# Patient Record
Sex: Male | Born: 1975 | Race: White | Hispanic: No | Marital: Single | Smoking: Current every day smoker
Health system: Southern US, Community
[De-identification: ages and names within clinical notes are randomized; demographics above are authoritative.]

## PROBLEM LIST (undated history)

## (undated) DIAGNOSIS — K219 Gastro-esophageal reflux disease without esophagitis: Secondary | ICD-10-CM

## (undated) DIAGNOSIS — Z8489 Family history of other specified conditions: Secondary | ICD-10-CM

## (undated) DIAGNOSIS — F419 Anxiety disorder, unspecified: Secondary | ICD-10-CM

## (undated) DIAGNOSIS — F32A Depression, unspecified: Secondary | ICD-10-CM

## (undated) DIAGNOSIS — F4310 Post-traumatic stress disorder, unspecified: Secondary | ICD-10-CM

## (undated) DIAGNOSIS — F329 Major depressive disorder, single episode, unspecified: Secondary | ICD-10-CM

## (undated) HISTORY — PX: FRACTURE SURGERY: SHX138

## (undated) HISTORY — PX: ELECTROCONVULSIVE THERAPY: SHX1495

---

## 2004-03-08 ENCOUNTER — Emergency Department: Payer: Self-pay | Admitting: Emergency Medicine

## 2012-03-18 ENCOUNTER — Emergency Department: Payer: Self-pay | Admitting: Unknown Physician Specialty

## 2012-03-18 LAB — COMPREHENSIVE METABOLIC PANEL
Anion Gap: 5 — ABNORMAL LOW (ref 7–16)
Bilirubin,Total: 0.4 mg/dL (ref 0.2–1.0)
Calcium, Total: 8.7 mg/dL (ref 8.5–10.1)
Co2: 26 mmol/L (ref 21–32)
EGFR (Non-African Amer.): >60
Osmolality: 278 (ref 275–301)
Potassium: 3.9 mmol/L (ref 3.5–5.1)
SGPT (ALT): 42 U/L (ref 12–78)
Total Protein: 8.2 g/dL (ref 6.4–8.2)

## 2012-03-18 LAB — DIFFERENTIAL
Basophil %: 0.5 %
Eosinophil #: 0.2 10*3/uL (ref 0.0–0.7)
Eosinophil %: 1.5 %
Lymphocyte %: 36 %
Monocyte #: 1.6 x10 3/mm — ABNORMAL HIGH (ref 0.2–1.0)
Neutrophil %: 50.1 %

## 2012-03-18 LAB — DRUG SCREEN, URINE
Barbiturates, Ur Screen: NEGATIVE (ref ?–200)
Cannabinoid 50 Ng, Ur ~~LOC~~: NEGATIVE (ref ?–50)
Cocaine Metabolite,Ur ~~LOC~~: NEGATIVE (ref ?–300)
MDMA (Ecstasy)Ur Screen: NEGATIVE (ref ?–500)
Methadone, Ur Screen: NEGATIVE (ref ?–300)
Phencyclidine (PCP) Ur S: NEGATIVE (ref ?–25)

## 2012-03-18 LAB — CBC
HCT: 46.9 % (ref 40.0–52.0)
HGB: 15.6 g/dL (ref 13.0–18.0)
MCH: 30.1 pg (ref 26.0–34.0)
Platelet: 381 10*3/uL (ref 150–440)
RDW: 12.8 % (ref 11.5–14.5)
WBC: 13 10*3/uL — ABNORMAL HIGH (ref 3.8–10.6)

## 2012-03-18 LAB — URINALYSIS, COMPLETE
Bacteria: NONE SEEN
Bilirubin,UR: NEGATIVE
Blood: NEGATIVE
Nitrite: NEGATIVE
Ph: 8 (ref 4.5–8.0)
Protein: NEGATIVE
RBC,UR: 2 /HPF (ref 0–5)
WBC UR: 2 /HPF (ref 0–5)

## 2012-03-18 LAB — TSH: Thyroid Stimulating Horm: 2.96 u[IU]/mL

## 2012-03-22 ENCOUNTER — Emergency Department: Payer: Self-pay | Admitting: Emergency Medicine

## 2012-03-22 LAB — COMPREHENSIVE METABOLIC PANEL
Albumin: 4.2 g/dL (ref 3.4–5.0)
Alkaline Phosphatase: 120 U/L (ref 50–136)
BUN: 12 mg/dL (ref 7–18)
Bilirubin,Total: 0.4 mg/dL (ref 0.2–1.0)
Chloride: 107 mmol/L (ref 98–107)
Creatinine: 0.75 mg/dL (ref 0.60–1.30)
EGFR (Non-African Amer.): >60
Glucose: 96 mg/dL (ref 65–99)
Potassium: 4 mmol/L (ref 3.5–5.1)
SGPT (ALT): 45 U/L (ref 12–78)
Total Protein: 7.8 g/dL (ref 6.4–8.2)

## 2012-03-22 LAB — URINALYSIS, COMPLETE
Bilirubin,UR: NEGATIVE
Ketone: NEGATIVE
Nitrite: NEGATIVE
Ph: 7 (ref 4.5–8.0)
Protein: NEGATIVE
Specific Gravity: 1.015 (ref 1.003–1.030)
WBC UR: NONE SEEN /HPF (ref 0–5)

## 2012-03-22 LAB — CBC
HGB: 15.6 g/dL (ref 13.0–18.0)
RBC: 5.11 10*6/uL (ref 4.40–5.90)
RDW: 12.6 % (ref 11.5–14.5)
WBC: 12.7 10*3/uL — ABNORMAL HIGH (ref 3.8–10.6)

## 2012-03-22 LAB — TROPONIN I: Troponin-I: <0.02 ng/mL

## 2012-03-31 ENCOUNTER — Ambulatory Visit: Payer: Self-pay | Admitting: Neurology

## 2012-04-08 ENCOUNTER — Ambulatory Visit: Payer: Self-pay | Admitting: Neurology

## 2012-11-27 ENCOUNTER — Ambulatory Visit: Payer: Self-pay | Admitting: Hematology and Oncology

## 2012-12-03 ENCOUNTER — Ambulatory Visit: Payer: Self-pay | Admitting: Family Medicine

## 2012-12-11 ENCOUNTER — Ambulatory Visit: Payer: Self-pay | Admitting: Hematology and Oncology

## 2012-12-11 LAB — CBC CANCER CENTER
Eosinophil #: 0.2 x10 3/mm (ref 0.0–0.7)
Lymphocyte #: 3.2 x10 3/mm (ref 1.0–3.6)
Lymphocyte %: 19.9 %
Monocyte #: 1.7 x10 3/mm — ABNORMAL HIGH (ref 0.2–1.0)
Monocyte %: 10.5 %
Neutrophil #: 10.8 x10 3/mm — ABNORMAL HIGH (ref 1.4–6.5)
Neutrophil %: 67.8 %
Platelet: 299 x10 3/mm (ref 150–440)
WBC: 15.9 x10 3/mm — ABNORMAL HIGH (ref 3.8–10.6)

## 2013-01-05 ENCOUNTER — Ambulatory Visit: Payer: Self-pay | Admitting: Hematology and Oncology

## 2013-01-20 ENCOUNTER — Ambulatory Visit: Payer: Self-pay | Admitting: Psychiatry

## 2013-01-20 ENCOUNTER — Inpatient Hospital Stay: Payer: Self-pay | Admitting: Psychiatry

## 2013-01-20 LAB — URINALYSIS, COMPLETE
Bacteria: NONE SEEN
Bilirubin,UR: NEGATIVE
Nitrite: NEGATIVE
Protein: NEGATIVE
Specific Gravity: 1 (ref 1.003–1.030)
Squamous Epithelial: NONE SEEN

## 2013-01-21 ENCOUNTER — Ambulatory Visit: Payer: Self-pay | Admitting: Psychiatry

## 2013-01-21 LAB — BEHAVIORAL MEDICINE 1 PANEL
Albumin: 3.5 g/dL
Alkaline Phosphatase: 88 U/L
Anion Gap: 5 — ABNORMAL LOW
BUN: 12 mg/dL
Basophil #: 0.1 x10 3/mm 3
Basophil %: 0.6 %
Bilirubin,Total: 0.5 mg/dL
Calcium, Total: 9.1 mg/dL
Chloride: 105 mmol/L
Co2: 28 mmol/L
Creatinine: 1.01 mg/dL
EGFR (African American): >60
EGFR (Non-African Amer.): >60
Eosinophil #: 0.2 x10 3/mm 3
Eosinophil %: 1.6 %
Glucose: 92 mg/dL
HCT: 44.4 %
HGB: 14.9 g/dL
Lymphocyte %: 29.8 %
Lymphs Abs: 4.2 x10 3/mm 3 — ABNORMAL HIGH
MCH: 30 pg
MCHC: 33.6 g/dL
MCV: 89 fL
Monocyte #: 1.3 "x10 3/mm " — ABNORMAL HIGH
Monocyte %: 9.2 %
Neutrophil #: 8.3 x10 3/mm 3 — ABNORMAL HIGH
Neutrophil %: 58.8 %
Osmolality: 275
Platelet: 260 x10 3/mm 3
Potassium: 3.9 mmol/L
RBC: 4.97 x10 6/mm 3
RDW: 13.1 %
SGOT(AST): 14 U/L — ABNORMAL LOW
SGPT (ALT): 14 U/L
Sodium: 138 mmol/L
Thyroid Stimulating Horm: 1.96 u[IU]/mL
Total Protein: 7 g/dL
WBC: 14.1 x10 3/mm 3 — ABNORMAL HIGH

## 2013-02-05 ENCOUNTER — Ambulatory Visit: Payer: Self-pay | Admitting: Psychiatry

## 2013-03-09 ENCOUNTER — Ambulatory Visit: Payer: Self-pay | Admitting: Psychiatry

## 2013-05-26 ENCOUNTER — Ambulatory Visit: Payer: Self-pay | Admitting: Family Medicine

## 2014-05-28 NOTE — H&P (Signed)
PATIENT NAME:  Ronald [name], Ronald [name] MR#:  [id number] DATE OF BIRTH:  February 07, 1975  DATE OF ASSESSMENT:  01/20/2013  IDENTIFYING INFORMATION AND CHIEF COMPLAINT: A 39 year old man admitted voluntarily for electroconvulsive therapy.   CHIEF COMPLAINT: "I'[name] a little nervous."   HISTORY OF PRESENT ILLNESS: Information obtained from the patient and from my previous interactions with him. This is a 39 year old man who reports that he started having severe depression and anxiety around January or February 2014. His symptoms have been getting worse since then despite his efforts to get treatment. He describes symptoms of generalized crippling anxiety and worry, fatigue, lack of ability to focus or concentrate. He gets irritable frequently. He feels down and hopeless and helpless. He has had some thoughts about running his car off the road to kill himself, but has never acted on it. He denies that he has had any hallucinations. The patient does not have an awareness of feeling that his life is extremely stressful, although he does have a lot of responsibilities. He works at a busy job and is involved with a woman who has young children. The patient has been compliant with psychiatric treatment without any response.   PAST PSYCHIATRIC HISTORY: As an adolescent, he had episodes of self-mutilation with some suicidal ideation, but no hospital treatment. He has been seeing a psychiatrist and a psychotherapist for most of 2014. His current medication is Zoloft 200 mg per day and Seroquel 100 mg at night and clonazepam 0.5 mg twice a day. These medications seemed to be slightly helpful when he first started the Zoloft, but have not continued to be helpful for him. He is not complaining of any side effects. He has had no prior hospitalizations, no prior ECT treatment. He goes for psychotherapy regularly.   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 He is involved with a woman who has children, but is not married.   FAMILY HISTORY: No known family history.   SUBSTANCE ABUSE HISTORY: Previous history of moderate drinking, but currently drinks very little to none.   PAST MEDICAL HISTORY: No significant ongoing medical problems.   CURRENT MEDICATIONS: Zoloft 200 mg p.o. daily, Seroquel 100 mg p.o. at bedtime, clonazepam 0.5 mg b.i.d.   ALLERGIES: No known drug allergies.   REVIEW OF SYSTEMS: Depressed mood, anxiety, low energy, poor sleep, inability to focus or concentrate. Feels hopeless and helpless. Denies suicidal intent. Denies hallucinations. No current pain or other physical symptoms.   MENTAL STATUS EXAMINATION: Neatly dressed and groomed young man who looks his stated age or younger. Cooperative with the interview. Good eye contact, normal psychomotor activity. Speech normal rate, tone and volume. Affect anxious and dysphoric. Mood stated as being worried. Thoughts are lucid. No obvious loosening of associations or delusions. Denies auditory or visual hallucinations. Passive suicidal thoughts with no intent or plan to act on it. No homicidal ideation. Reasonably good judgment and insight. Normal intelligence. Alert and oriented x4.   PHYSICAL EXAMINATION:  GENERAL: The patient appears to be in no acute physical distress.  SKIN: No skin lesions identified.  HEENT: Pupils equal and reactive. Face symmetric.  NEUROLOGIC: Strength and reflexes normal and symmetric throughout. Gait within normal limits. Cranial nerves symmetric and normal. MUSCULOSKELETAL: Back and neck nontender.   LUNGS: Clear without wheezes.  HEART: Regular rate and rhythm. ABDOMEN: Soft, nontender, normal bowel sounds.  VITAL SIGNS: Temperature 98.4, pulse 72, respirations  16, blood pressure 140/100.   TOBACCO HISTORY: The patient is a regular smoker of a little less than a pack a day.   LABORATORY DATA: I have ordered a workup set  of labs, nothing is back yet.   ASSESSMENT: A 39 year old man with major depression, severe, unresponsive to medication, with passive suicidal ideation and severe impairment of his ability to work. He has not been able to work for a couple months now because of his illness. He very much wants to receive electroconvulsive therapy. He came to the hospital specifically with the intention of starting electroconvulsive therapy.   TREATMENT PLAN: The patient has been educated about ECT and watched the video. He has been given an opportunity to ask questions. We will continue his current medications for right now and plan to initiate ECT starting tomorrow, right unilateral treatment. Orders completed. Anticipate possible discharge tomorrow afternoon.   DIAGNOSIS, PRINCIPAL AND PRIMARY:  AXIS I: Major depression, severe, recurrent.   SECONDARY DIAGNOSES:  AXIS I: No further.  AXIS II: No diagnosis.  AXIS III: No diagnosis.  AXIS IV: Moderate to severe stress from illness and multiple responsibilities.  AXIS V: Functioning at time of evaluation 37.  ____________________________ Audery Amel, MD jtc:lb D: 01/20/2013 13:28:41 ET T: 01/20/2013 13:37:34 ET JOB#: [id number]  cc: Audery Amel, MD, <Dictator> Audery Amel MD ELECTRONICALLY SIGNED 01/21/2013 12:03

## 2014-05-28 NOTE — Discharge Summary (Signed)
PATIENT NAME:  Ronald [name], [name] MR#:  [id number] DATE OF BIRTH:  1975-02-11  DATE OF ADMISSION:  01/20/2013 DATE OF DISCHARGE:  01/21/2013  HOSPITAL COURSE: See dictated history and physical for details of admission. This is a 39 year old man with a history of major depression and has been unresponsive to medication. He was admitted to begin ECT. The patient was counseled and educated about ECT and saw the film and gave consent. Lab studies were ordered and reviewed. The patient was n.p.o. after midnight on Tuesday and had his first right unilateral ECT treatment on the morning of the 17th. Treatment went well and was tolerated well. He had a transient headache that was treated in the recovery room with Toradol. No other side effects. This afternoon, he reports that he is feeling fine. Denies any suicidal ideation, feels more optimistic, and is looking forward to continuing ECT treatment. The patient was counseled about the overall treatment plan. He is to continue his current medicine, but not take clonazepam the morning of treatment. He will be communicated with tomorrow about treatment time for Friday.   MENTAL STATUS EXAM AT DISCHARGE: Neatly dressed and groomed gentleman who looks his stated age, cooperative with the interview. Good eye contact. Normal psychomotor activity. Speech normal rate, tone and volume. Affect euthymic, reactive, appropriate. Mood stated as good. Thoughts lucid. No loosening of associations or delusions. Denies auditory or visual hallucinations. Denies suicidal or homicidal ideation. Shows good insight and judgment. Normal intelligence. Alert and oriented x 4.   LABORATORY RESULTS: TSH normal at 1.0. Urinalysis completely normal. EKG showed normal sinus rhythm with no significant clear abnormalities. Chest x-ray was showing focal old scarring in the lung bases, no acute disease. The full chemistry panel and hematology panel showed a slight increase in white count at 14.1.  Chemistries were all unremarkable except for a low AST at 14.   DISCHARGE MEDICATIONS: Klonopin 0.5 mg twice a day, Zoloft 100 mg 2 tablets once a day, and Seroquel 100 mg at night.   DISPOSITION: Discharge home. Follow up with ECT Friday.   DIAGNOSIS, PRINCIPAL AND PRIMARY:  AXIS I: Major depression, severe, single.   SECONDARY DIAGNOSES: AXIS I: Generalized anxiety disorder.  AXIS II: No diagnosis.  AXIS III: No diagnosis.  AXIS IV: Moderate to severe from being out of work, multiple stresses in his life.  AXIS V: Functioning at time of discharge 52.   ____________________________ Audery AmelJohn T. Emiliano Welshans, MD jtc:jcm D: 01/21/2013 13:52:00 ET T: 01/21/2013 20:08:39 ET JOB#: [id number]  cc: Audery AmelJohn T. Deneka Greenwalt, MD, <Dictator> Audery AmelJOHN T Zennie Ayars MD ELECTRONICALLY SIGNED 01/22/2013 10:38

## 2014-07-14 ENCOUNTER — Emergency Department
Admission: EM | Admit: 2014-07-14 | Discharge: 2014-07-15 | Disposition: A | Discharge status: Home / Self Care | Payer: Self-pay | Attending: Student | Admitting: Student

## 2014-07-14 ENCOUNTER — Emergency Department: Payer: Self-pay

## 2014-07-14 ENCOUNTER — Encounter: Payer: Self-pay | Admitting: *Deleted

## 2014-07-14 DIAGNOSIS — Y998 Other external cause status: Secondary | ICD-10-CM | POA: Insufficient documentation

## 2014-07-14 DIAGNOSIS — Y9289 Other specified places as the place of occurrence of the external cause: Secondary | ICD-10-CM | POA: Insufficient documentation

## 2014-07-14 DIAGNOSIS — Y9364 Activity, baseball: Secondary | ICD-10-CM | POA: Insufficient documentation

## 2014-07-14 DIAGNOSIS — Z720 Tobacco use: Secondary | ICD-10-CM | POA: Insufficient documentation

## 2014-07-14 DIAGNOSIS — S8012XA Contusion of left lower leg, initial encounter: Secondary | ICD-10-CM | POA: Insufficient documentation

## 2014-07-14 DIAGNOSIS — W2107XA Struck by softball, initial encounter: Secondary | ICD-10-CM | POA: Insufficient documentation

## 2014-07-14 HISTORY — DX: Major depressive disorder, single episode, unspecified: F32.9

## 2014-07-14 HISTORY — DX: Depression, unspecified: F32.A

## 2014-07-14 HISTORY — DX: Anxiety disorder, unspecified: F41.9

## 2014-07-14 MED ORDER — IBUPROFEN 800 MG PO TABS: 800.0000 mg | ORAL_TABLET | Freq: Three times a day (TID) | ORAL | Status: DC | PRN

## 2014-07-14 MED ORDER — TETANUS-DIPHTHERIA TOXOIDS TD 5-2 LFU IM INJ
INJECTION | INTRAMUSCULAR | Status: AC
  Filled 2014-07-14: qty 0.5

## 2014-07-14 MED ORDER — OXYCODONE-ACETAMINOPHEN 5-325 MG PO TABS: 1.0000 | ORAL_TABLET | Freq: Three times a day (TID) | ORAL | Status: DC | PRN

## 2014-07-14 MED ORDER — OXYCODONE-ACETAMINOPHEN 5-325 MG PO TABS
1.0000 | ORAL_TABLET | Freq: Once | ORAL | Status: AC
  Administered 2014-07-14: 1 via ORAL

## 2014-07-14 MED ORDER — OXYCODONE-ACETAMINOPHEN 5-325 MG PO TABS
ORAL_TABLET | ORAL | Status: AC
  Filled 2014-07-14: qty 1

## 2014-07-14 MED ORDER — CEPHALEXIN 500 MG PO CAPS: 500.0000 mg | ORAL_CAPSULE | Freq: Four times a day (QID) | ORAL | Status: AC

## 2014-07-14 MED ORDER — IBUPROFEN 800 MG PO TABS
800.0000 mg | ORAL_TABLET | Freq: Once | ORAL | Status: AC
  Administered 2014-07-14: 800 mg via ORAL

## 2014-07-14 MED ORDER — IBUPROFEN 800 MG PO TABS
ORAL_TABLET | ORAL | Status: AC
  Filled 2014-07-14: qty 1

## 2014-07-14 MED ORDER — TETANUS-DIPHTH-ACELL PERTUSSIS 5-2.5-18.5 LF-MCG/0.5 IM SUSP
0.5000 mL | Freq: Once | INTRAMUSCULAR | Status: AC
  Administered 2014-07-14: 0.5 mL via INTRAMUSCULAR

## 2014-07-14 NOTE — Discharge Instructions (Signed)
Apply ice, elevate. Take medication as prescribed. Use crutches and ace wrap as long as pain continues. Rest.   Follow up with orthopedic next week as needed for continued pain.   Return to ER for new or worsening concerns.   Contusion A contusion is a deep bruise. Contusions are the result of an injury that caused bleeding under the skin. The contusion may turn blue, purple, or yellow. Minor injuries will give you a painless contusion, but more severe contusions may stay painful and swollen for a few weeks.  CAUSES  A contusion is usually caused by a blow, trauma, or direct force to an area of the body. SYMPTOMS   Swelling and redness of the injured area.  Bruising of the injured area.  Tenderness and soreness of the injured area.  Pain. DIAGNOSIS  The diagnosis can be made by taking a history and physical exam. An X-ray, CT scan, or MRI may be needed to determine if there were any associated injuries, such as fractures. TREATMENT  Specific treatment will depend on what area of the body was injured. In general, the best treatment for a contusion is resting, icing, elevating, and applying cold compresses to the injured area. Over-the-counter medicines may also be recommended for pain control. Ask your caregiver what the best treatment is for your contusion. HOME CARE INSTRUCTIONS   Put ice on the injured area.  Put ice in a plastic bag.  Place a towel between your skin and the bag.  Leave the ice on for 15-20 minutes, 3-4 times a day, or as directed by your health care provider.  Only take over-the-counter or prescription medicines for pain, discomfort, or fever as directed by your caregiver. Your caregiver may recommend avoiding anti-inflammatory medicines (aspirin, ibuprofen, and naproxen) for 48 hours because these medicines may increase bruising.  Rest the injured area.  If possible, elevate the injured area to reduce swelling. SEEK IMMEDIATE MEDICAL CARE IF:   You have  increased bruising or swelling.  You have pain that is getting worse.  Your swelling or pain is not relieved with medicines. MAKE SURE YOU:   Understand these instructions.  Will watch your condition.  Will get help right away if you are not doing well or get worse. Document Released: 11/01/2004 Document Revised: 01/27/2013 Document Reviewed: 11/27/2010 [facility] Patient Information 2015 [employer], [place]. This information is not intended to replace advice given to you by your health care provider. Make sure you discuss any questions you have with your health care provider.

## 2014-07-14 NOTE — ED Notes (Signed)
Pt ambulatory to triage with steady gait and reports while playing softball last Wednesday a line drive hit his left lower leg and ricocheted off his right leg. He c/o pain and bruising in left lower leg.

## 2014-07-14 NOTE — ED Provider Notes (Signed)
[facility] Emergency Department Provider Note  ____________________________________________  Time seen: Approximately 9:28 PM  I have reviewed the triage vital signs and the nursing notes.   HISTORY  Chief Complaint Leg Injury   HPI Ronald JollyJames M Brosch Jr. is a 39 y.o. male presents to the ER for complaints of left lower leg pain. Patient states that 5 weeks ago he was hit with a softball directly to his left shin and has had continued pain since. However states that one week ago he was again playing softball and again hit with another "line drive "that caused increased pain. Patient states that he has had continued bruising and swelling to the site since injury. However patient states that he has continued to walk including playing another softball game 2 days ago. Denies other fall or injury. Denies pain radiation.  States current pain to the left lower leg is 7 out of 10 aching and throbbing. States worse with movement and ambulation. Denies numbness or tingling sensations.  Denies chest pain or shortness of breath, abdominal pain, nausea, vomiting, or diarrhea. Denies head injury or LOC.    Past Medical History  Diagnosis Date  . Depression   . Anxiety     There are no active problems to display for this patient.   History reviewed. No pertinent past surgical history.  Current Outpatient Rx  Name  Route  Sig  Dispense  Refill  . NONE per patient          .           Marland Kitchen.             Allergies Review of patient's allergies indicates no known allergies.  History reviewed. No pertinent family history.  Social History History  Substance Use Topics  . Smoking status: Current Every Day Smoker  . Smokeless tobacco: Not on file  . Alcohol Use: Yes    Review of Systems Constitutional: No fever/chills Eyes: No visual changes. ENT: No sore throat. Cardiovascular: Denies chest pain. Respiratory: Denies shortness of breath. Gastrointestinal: No  abdominal pain.  No nausea, no vomiting.  No diarrhea.  No constipation. Genitourinary: Negative for dysuria. Musculoskeletal: Negative for back pain. Positive for left lower leg pain. Skin: Negative for rash. Neurological: Negative for headaches, focal weakness or numbness.  10-point ROS otherwise negative.  ____________________________________________   PHYSICAL EXAM:  VITAL SIGNS: ED Triage Vitals  Enc Vitals Group     BP 07/14/14 2050 156/118 mmHg     Pulse Rate 07/14/14 2050 90     Resp -- 18     Temp 07/14/14 2050 98.6 F (37 C)     Temp Source 07/14/14 2050 Oral     SpO2 07/14/14 2050 98 %     Weight 07/14/14 2050 180 lb (81.647 kg)     Height 07/14/14 2050 5\' 8"  (1.727 m)     Head Cir --      Peak Flow --      Pain Score 07/14/14 2053 8     Pain Loc --      Pain Edu? --      Excl. in GC? --    Today's Vitals   07/14/14 2053 07/14/14 2054 07/14/14 2147 07/14/14 2358  BP:  143/103 150/104 144/99  Pulse:   76 63  Temp:   98.5 F (36.9 C) 98 F (36.7 C)  TempSrc:   Oral   Resp:   18 18  SpO2:   96% 96%  PainSc:  8        Constitutional: Alert and oriented. Well appearing and in no acute distress. Eyes: Conjunctivae are normal. PERRL. EOMI. Head: Atraumatic. Nose: No congestion/rhinnorhea. Mouth/Throat: Mucous membranes are moist.  Oropharynx non-erythematous. Neck: No stridor.  No cervical spine tenderness to palpation. Hematological/Lymphatic/Immunilogical: No cervical lymphadenopathy. Cardiovascular: Normal rate, regular rhythm. Grossly normal heart sounds.  Good peripheral circulation. Respiratory: Normal respiratory effort.  No retractions. Lungs CTAB. Gastrointestinal: Soft and nontender. No distention. No abdominal bruits. No CVA tenderness. Musculoskeletal: No lower extremity tenderness nor edema.  No joint effusions. No cervical, thoracic, lumbar tenderness. Full range of motion. Choose positions quickly without distress or pain. Except: Left lower  leg medial tibial and anterior tibial moderate ecchymosis and mild to moderate swelling. Superficial abrasions present with mild surrounding erythema. No induration or fluctuance.  Distal pedal pulses equal bilaterally and easily found. Full range of motion, however pain with dorsi and plantar flexion. Steady gait in room.NO pain above left knee of below left ankle. NO swelling to left foot. Right leg full ROM and nontender. Full ROM and nontender bilateral upper extremities.  Neurologic:  Normal speech and language. No gross focal neurologic deficits are appreciated. Speech is normal. No gait instability. Skin:  Skin is warm, dry and intact. No rash noted. Psychiatric: Mood and affect are normal. Speech and behavior are normal.   RADIOLOGY  LEFT TIBIA AND FIBULA - 2 VIEW  COMPARISON: None.  FINDINGS: No fracture of the tibia or fibula. Knee joint and ankle joint appear normal on two views.  IMPRESSION: No acute osseous abnormality.   Electronically Signed By: Genevive Bi M.D. On: 07/14/2014 21:51  Left LOWER EXTREMITY VENOUS DOPPLER ULTRASOUND  TECHNIQUE: Gray-scale sonography with graded compression, as well as color Doppler and duplex ultrasound were performed to evaluate the lower extremity deep venous systems from the level of the common femoral vein and including the common femoral, femoral, profunda femoral, popliteal and calf veins including the posterior tibial, peroneal and gastrocnemius veins when visible. The superficial great saphenous vein was also interrogated. Spectral Doppler was utilized to evaluate flow at rest and with distal augmentation maneuvers in the common femoral, femoral and popliteal veins.  COMPARISON: None.  FINDINGS: Contralateral Common Femoral Vein: Respiratory phasicity is normal and symmetric with the symptomatic side. No evidence of thrombus. Normal compressibility.  Common Femoral Vein: No evidence of thrombus.  Normal compressibility, respiratory phasicity and response to augmentation.  Saphenofemoral Junction: No evidence of thrombus. Normal compressibility and flow on color Doppler imaging.  Profunda Femoral Vein: No evidence of thrombus. Normal compressibility and flow on color Doppler imaging.  Femoral Vein: No evidence of thrombus. Normal compressibility, respiratory phasicity and response to augmentation.  Popliteal Vein: No evidence of thrombus. Normal compressibility, respiratory phasicity and response to augmentation.  Calf Veins: No evidence of thrombus. Normal compressibility and flow on color Doppler imaging.  Superficial Great Saphenous Vein: No evidence of thrombus. Normal compressibility and flow on color Doppler imaging.  Venous Reflux: None.  Other Findings: None.  IMPRESSION: No evidence of deep venous thrombosis.   Electronically Signed By: Ellery Plunk M.D. On: 07/14/2014 23:09 ____________________________________________   PROCEDURES  Procedure(s) performed: SPLINT APPLICATION Date/Time: 12:04 AM Authorized by: Renford Dills Consent: Verbal consent obtained. Risks and benefits: risks, benefits and alternatives were discussed Consent given by: patient Splint applied by: ed technician Location details: left lower leg Splint type: ace wrap and crutches Post-procedure: The splinted body part was neurovascularly unchanged following the procedure. Patient tolerance:  Patient tolerated the procedure well with no immediate complications.   ____________________________________________   INITIAL IMPRESSION / ASSESSMENT AND PLAN / ED COURSE  Pertinent labs & imaging results that were available during my care of the patient were reviewed by me and considered in my medical decision making (see chart for details).  Well-appearing patient. No acute distress. Complains of left lower tib-fib pain post softball injury. Initial supple injury 5 weeks  ago and repeat injury 1 week ago. Continues to ambulate. We'll evaluate by ultrasound due to continued pain and swelling, as well as x-ray.  Ultrasound negative for deep venous thrombosis. Left tib-fib x-ray negative for acute osseous abnormality. Will discharge with Ace wrap and crutches. Patient to elevate and apply ice. Discussed rest. When necessary pain medication. As well as due to mild erythema surrounding abrasions will also treat with oral cephalexin. Follow-up with orthopedic next week as needed for continued pain. Patient agreed to plan.Also discussed monitoring blood pressure at home. Patient states he will monitor it and keep a diary and follow up with his primary care physician regarding blood pressure. Denies chest pain, headache, dizziness or other complaints. ____________________________________________   FINAL CLINICAL IMPRESSION(S) / ED DIAGNOSES  Final diagnoses:  Contusion of left leg, initial encounter  Left lower leg pain, initial acute    Renford Dills, NP 07/15/14 0004  Gayla Doss, MD 07/27/14 [phone]

## 2014-08-01 ENCOUNTER — Encounter: Payer: Self-pay | Admitting: Emergency Medicine

## 2014-08-01 ENCOUNTER — Emergency Department
Admission: EM | Admit: 2014-08-01 | Discharge: 2014-08-01 | Disposition: A | Discharge status: Home / Self Care | Payer: Self-pay | Attending: Emergency Medicine | Admitting: Emergency Medicine

## 2014-08-01 ENCOUNTER — Emergency Department: Payer: Self-pay

## 2014-08-01 DIAGNOSIS — S81802A Unspecified open wound, left lower leg, initial encounter: Secondary | ICD-10-CM

## 2014-08-01 DIAGNOSIS — L089 Local infection of the skin and subcutaneous tissue, unspecified: Secondary | ICD-10-CM | POA: Insufficient documentation

## 2014-08-01 DIAGNOSIS — Y9289 Other specified places as the place of occurrence of the external cause: Secondary | ICD-10-CM | POA: Insufficient documentation

## 2014-08-01 DIAGNOSIS — Z720 Tobacco use: Secondary | ICD-10-CM | POA: Insufficient documentation

## 2014-08-01 DIAGNOSIS — Y9364 Activity, baseball: Secondary | ICD-10-CM | POA: Insufficient documentation

## 2014-08-01 DIAGNOSIS — Y998 Other external cause status: Secondary | ICD-10-CM | POA: Insufficient documentation

## 2014-08-01 DIAGNOSIS — W2107XA Struck by softball, initial encounter: Secondary | ICD-10-CM | POA: Insufficient documentation

## 2014-08-01 DIAGNOSIS — T798XXA Other early complications of trauma, initial encounter: Secondary | ICD-10-CM | POA: Insufficient documentation

## 2014-08-01 MED ORDER — SULFAMETHOXAZOLE-TRIMETHOPRIM 800-160 MG PO TABS: 1.0000 | ORAL_TABLET | Freq: Two times a day (BID) | ORAL | Status: DC

## 2014-08-01 MED ORDER — SULFAMETHOXAZOLE-TRIMETHOPRIM 800-160 MG PO TABS
ORAL_TABLET | ORAL | Status: AC
  Administered 2014-08-01: 1 via ORAL
  Filled 2014-08-01: qty 1

## 2014-08-01 MED ORDER — SULFAMETHOXAZOLE-TRIMETHOPRIM 800-160 MG PO TABS
1.0000 | ORAL_TABLET | Freq: Once | ORAL | Status: AC
  Administered 2014-08-01: 1 via ORAL

## 2014-08-01 NOTE — Discharge Instructions (Signed)
Dressing change 2 times per day. Return to the ER if not improving in 2-3 days or sooner if worse. Follow up with [facility] for depression and anxiety medication management.

## 2014-08-01 NOTE — ED Notes (Signed)
Pt was seen here two weeks ago for injury to lower left leg. Pt has hole noted in leg to lower left extremity. Wound is draining and redness noted around site.

## 2014-08-01 NOTE — ED Provider Notes (Signed)
[facility] Emergency Department Provider Note ____________________________________________  Time seen: Approximately 8:51 PM  I have reviewed the triage vital signs and the nursing notes.   HISTORY  Chief Complaint Wound Check   HPI Ronald Amore. is a 39 y.o. male presents to the emergency department for a recheck of a wound to the left lower extremity. He states that he was seen here a couple weeks ago for an injury to his lower leg but did not have any infection at that time. He states that over the past 2 weeks he has had some drainage from the area where a softball struck him in the leg. The area opened up today after he got into the swimming pool with his daughter. He had been prescribed antibiotics at his last visit, but did not fill the prescription due to financial difficulty. He denies an increase in pain over the last couple of days. He states that the redness and swelling that had been there is now resolving. He is not taking anything right now for pain. He is also requesting refills of his depression and anxiety medications. But he states that he has an appointment with [facility] tomorrow.   Past Medical History  Diagnosis Date  . Depression   . Anxiety     There are no active problems to display for this patient.   History reviewed. No pertinent past surgical history.  Current Outpatient Rx  Name  Route  Sig  Dispense  Refill  . ibuprofen (ADVIL,MOTRIN) 800 MG tablet   Oral   Take 1 tablet (800 mg total) by mouth every 8 (eight) hours as needed for mild pain or moderate pain.   15 tablet   0   . oxyCODONE-acetaminophen (ROXICET) 5-325 MG per tablet   Oral   Take 1 tablet by mouth every 8 (eight) hours as needed for moderate pain or severe pain (Do not drive or operate heavy machinery while taking as can cause drowsiness.).   9 tablet   0     Allergies Review of patient's allergies indicates no known allergies.  History reviewed. No  pertinent family history.  Social History History  Substance Use Topics  . Smoking status: Current Every Day Smoker -- 1.00 packs/day    Types: Cigarettes  . Smokeless tobacco: Not on file  . Alcohol Use: Yes     Comment: occasional    Review of Systems   Constitutional: No fever/chills Eyes: No visual changes. ENT: No rhinorrhea, congestion, or sore throat Cardiovascular: Denies chest pain. Respiratory: Denies shortness of breath. Gastrointestinal: No abdominal pain.  No nausea, no vomiting.  No diarrhea.  No constipation. Genitourinary: Negative for dysuria. Musculoskeletal: Negative for back pain. Skin: Open lesion to the left lower extremity. Neurological: Negative for headaches, focal weakness or numbness.  10-point ROS otherwise negative.  ____________________________________________   PHYSICAL EXAM:  VITAL SIGNS: ED Triage Vitals  Enc Vitals Group     BP 08/01/14 2021 135/92 mmHg     Pulse Rate 08/01/14 2021 90     Resp 08/01/14 2021 18     Temp 08/01/14 2021 98.4 F (36.9 C)     Temp Source 08/01/14 2021 Oral     SpO2 08/01/14 2021 96 %     Weight 08/01/14 2021 180 lb (81.647 kg)     Height 08/01/14 2021 5\' 10"  (1.778 m)     Head Cir --      Peak Flow --      Pain  Score 08/01/14 2015 0     Pain Loc --      Pain Edu? --      Excl. in [place]? --     Constitutional: Alert and oriented. Well appearing and in no acute distress. Eyes: Conjunctivae are normal. PERRL. EOMI. Head: Atraumatic. Nose: No congestion/rhinnorhea. Mouth/Throat: Mucous membranes are moist.  Oropharynx non-erythematous. No oral lesions. Neck: No stridor. Cardiovascular: Normal rate, regular rhythm.  Good peripheral circulation. Respiratory: Normal respiratory effort.  No retractions. Lungs CTAB. Gastrointestinal: Soft and nontender. No distention. No abdominal bruits.  Musculoskeletal: No lower extremity tenderness nor edema.  No joint effusions. Neurologic:  Normal speech and  language. No gross focal neurologic deficits are appreciated. Speech is normal. No gait instability. Skin:  Rash no; Erythema yes, localized around ulceration; Negative for petechiae.  Psychiatric: Mood and affect are normal. Speech and behavior are normal.  ____________________________________________   LABS (all labs ordered are listed, but only abnormal results are displayed)  Labs Reviewed - No data to display ____________________________________________  EKG   ____________________________________________  RADIOLOGY  No evidence of osteomyelitis or soft tissue gas. ____________________________________________   PROCEDURES  Procedure(s) performed: Wet-to-dry dressing applied by ER tech. ____________________________________________   INITIAL IMPRESSION / ASSESSMENT AND PLAN / ED COURSE  Pertinent labs & imaging results that were available during my care of the patient were reviewed by me and considered in my medical decision making (see chart for details).  Patient was advised to return to the emergency department in 2- 3 days for symptoms that aren't improving. He was advised to take the antibiotic until finished. He was advised to wet-to-dry dressing changes twice a day. He was advised to follow up with [facility] tomorrow as scheduled for medication management. ____________________________________________   FINAL CLINICAL IMPRESSION(S) / ED DIAGNOSES  Final diagnoses:  Wound of left leg, initial encounter       Ronald Pester, FNP 08/01/14 2159  [name], MD 08/01/14 2259

## 2015-10-22 IMAGING — US ABDOMEN ULTRASOUND LIMITED
1 series · 14 of 19 positions shown · non-contrast
Comparison: none

REASON FOR EXAM: gallbladder liver no appendix  abd tenderness RLQ
COMMENTS:

[Series 1: abdomen ultrasound limited · 0.24mm/px · 14 of 19 slices shown]
[im 1/19]
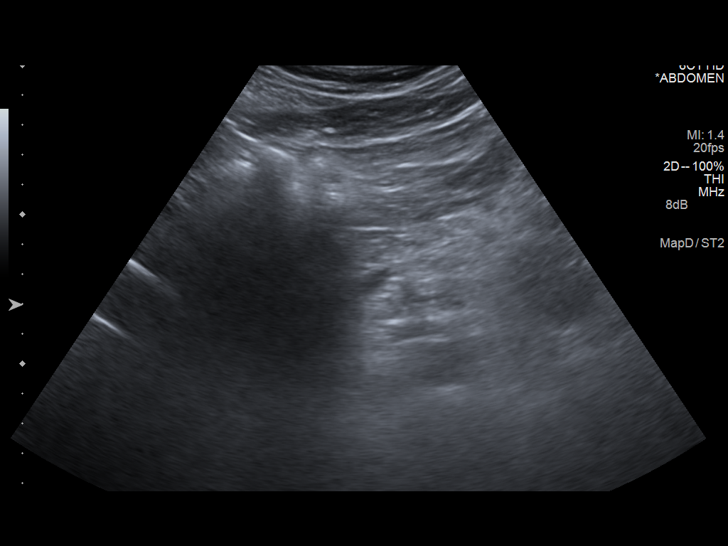
[im 3/19]
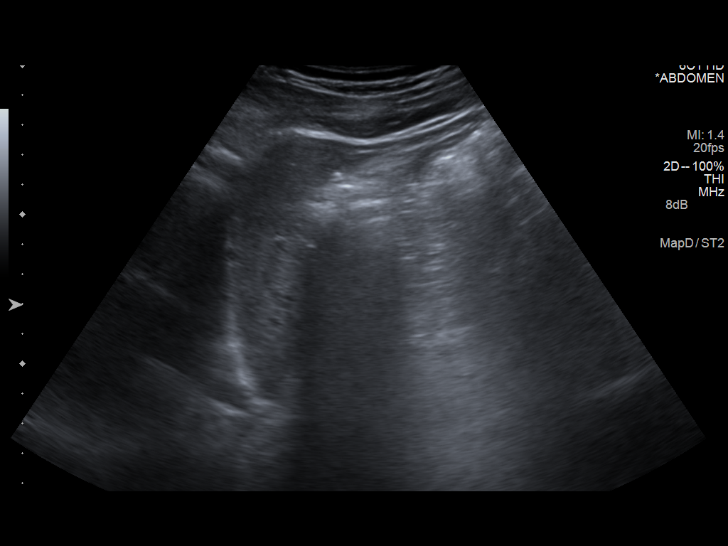
[im 4/19]
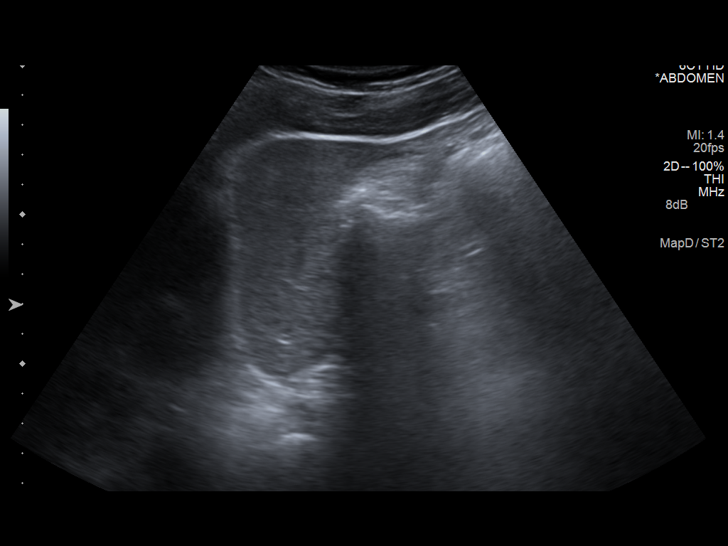
[im 5/19]
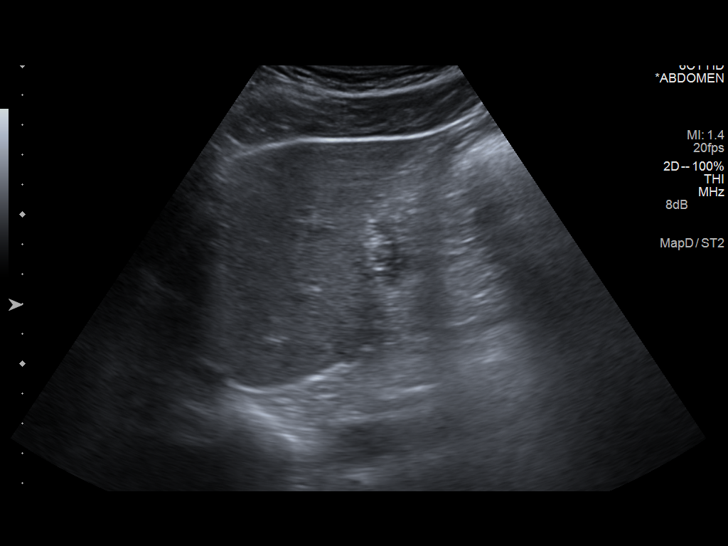
[im 7/19]
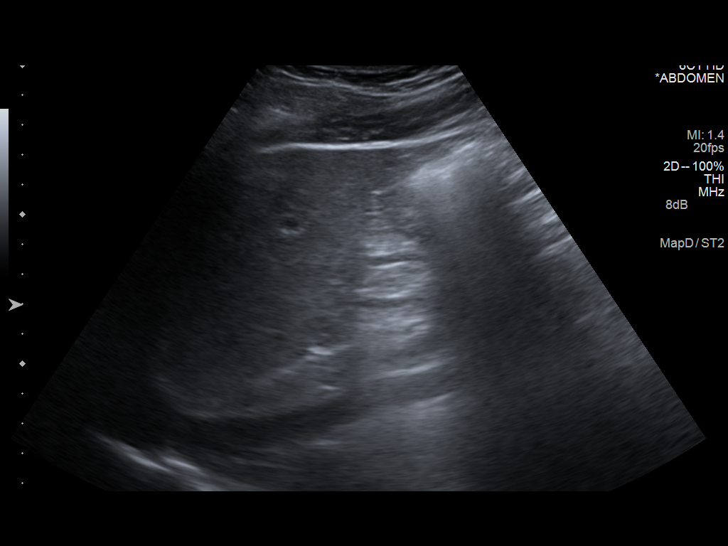
[im 8/19]
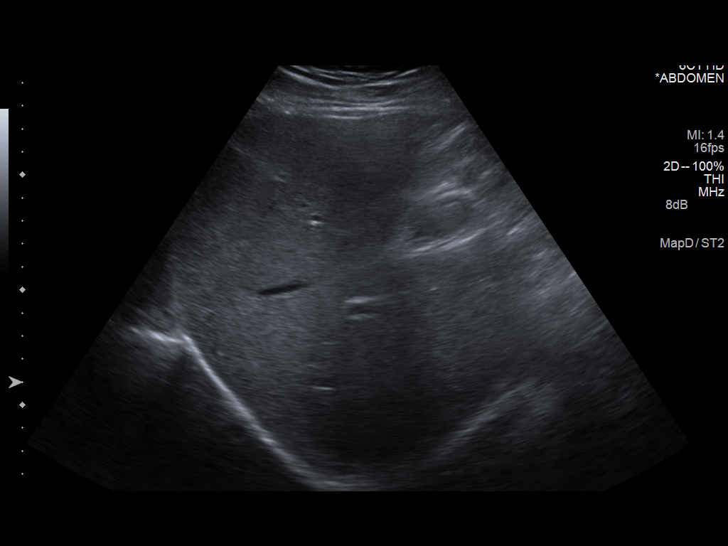
[im 9/19]
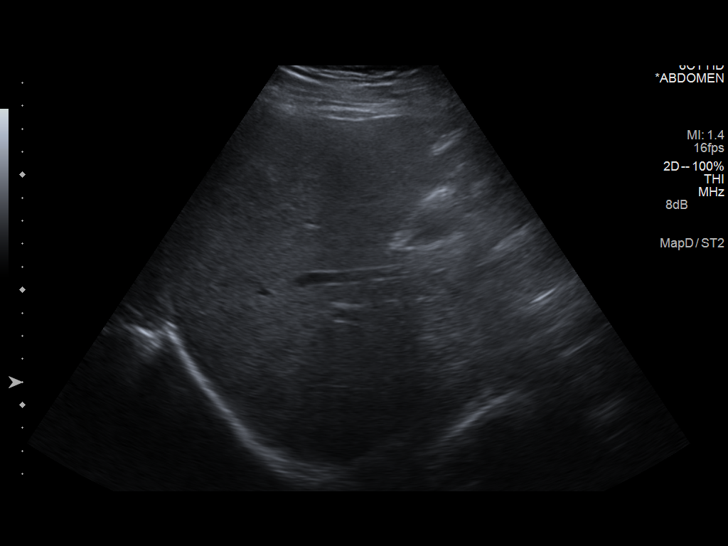
[im 11/19]
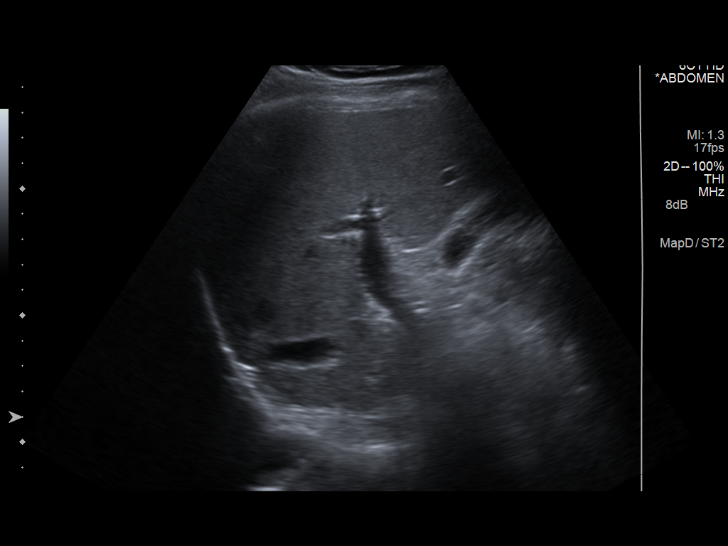
[im 12/19]
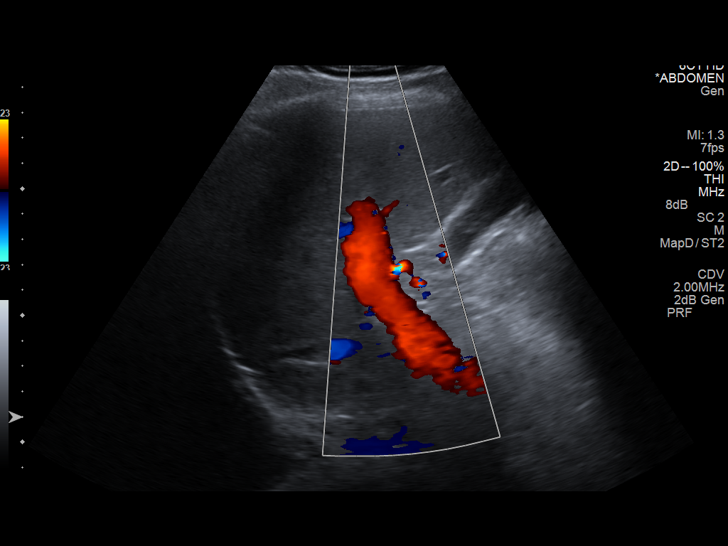
[im 13/19]
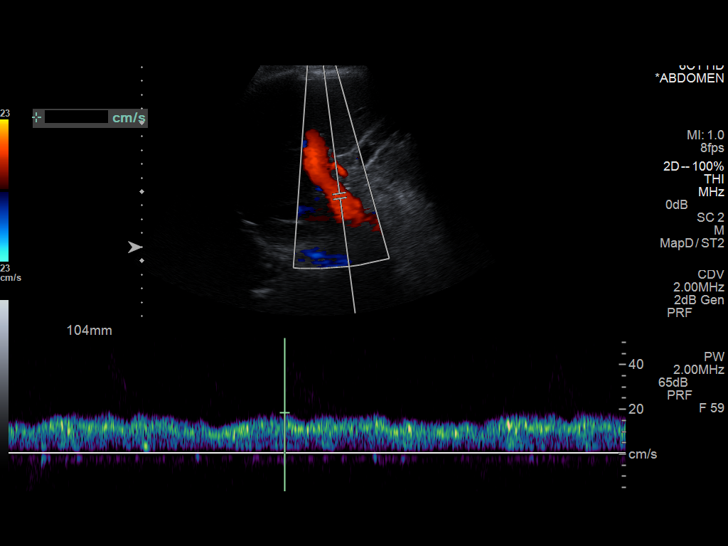
[im 15/19]
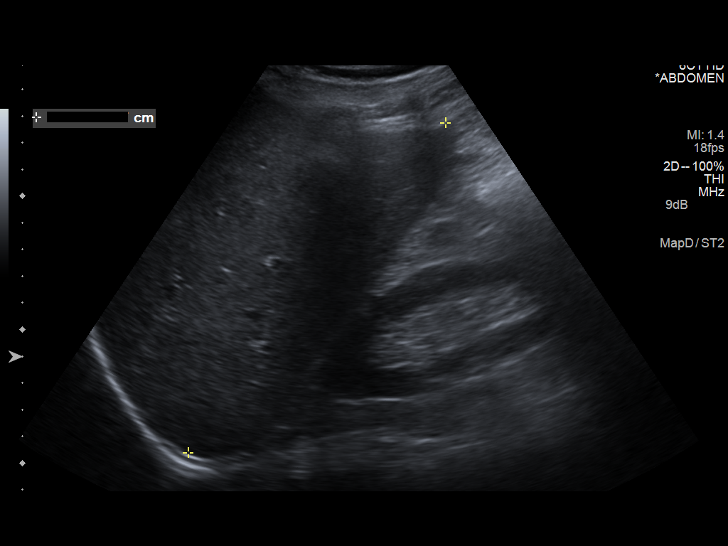
[im 16/19]
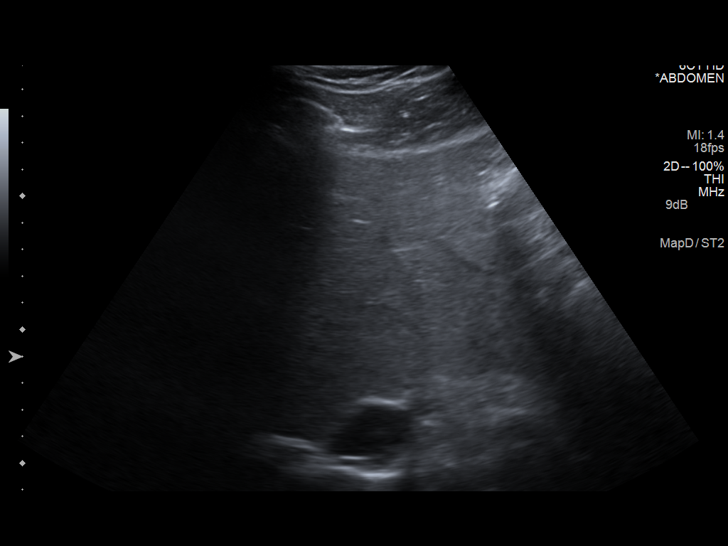
[im 17/19]
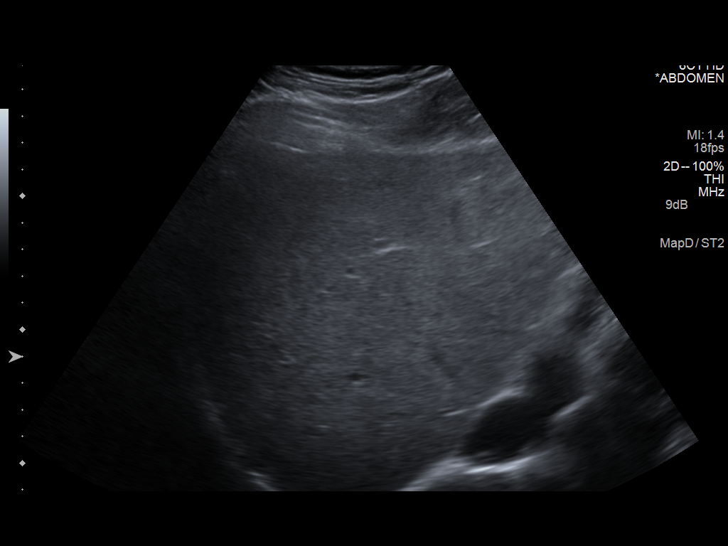
[im 19/19]
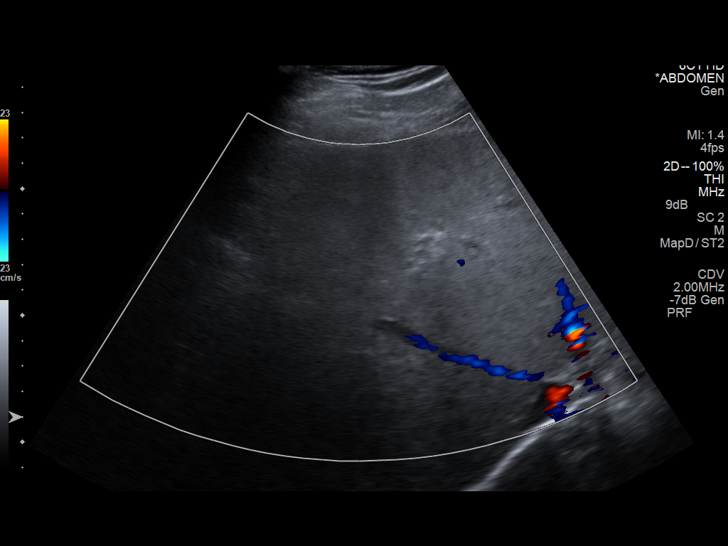

[14 of 19 positions shown; findings below may reference images not displayed]

PROCEDURE:     US  - US ABDOMEN LIMITED SURVEY  - December 03, 2012  [DATE]

RESULT:     Limited right upper quadrant abdominal sonogram is performed.
The pancreas is obscured by overlying bowel gas. There is a negative
sonographic Murphy's sign. No stones are evident. The gallbladder wall
thickness is 2.3 mm. The hepatic echotexture is normal. The liver length is
15.67 cm. There is no intrahepatic or extrahepatic biliary ductal dilation.
Common bile duct diameter is 2.4 mm. The portal venous flow is normal. There
is no ascites.
IMPRESSION: 1. Nonvisualization of the pancreas because of overlying bowel gas.
2. Normal appearing right upper quadrant abdominal sonogram.

[REDACTED]

## 2016-05-14 ENCOUNTER — Encounter: Payer: Self-pay | Admitting: Emergency Medicine

## 2016-05-14 ENCOUNTER — Emergency Department
Admission: EM | Admit: 2016-05-14 | Discharge: 2016-05-14 | Disposition: A | Discharge status: Home / Self Care | Payer: Self-pay | Attending: Emergency Medicine | Admitting: Emergency Medicine

## 2016-05-14 ENCOUNTER — Emergency Department: Payer: Self-pay

## 2016-05-14 DIAGNOSIS — M25572 Pain in left ankle and joints of left foot: Secondary | ICD-10-CM | POA: Insufficient documentation

## 2016-05-14 DIAGNOSIS — Y929 Unspecified place or not applicable: Secondary | ICD-10-CM | POA: Insufficient documentation

## 2016-05-14 DIAGNOSIS — W2107XA Struck by softball, initial encounter: Secondary | ICD-10-CM | POA: Insufficient documentation

## 2016-05-14 DIAGNOSIS — Y9364 Activity, baseball: Secondary | ICD-10-CM | POA: Insufficient documentation

## 2016-05-14 DIAGNOSIS — F17210 Nicotine dependence, cigarettes, uncomplicated: Secondary | ICD-10-CM | POA: Insufficient documentation

## 2016-05-14 DIAGNOSIS — Y990 Civilian activity done for income or pay: Secondary | ICD-10-CM | POA: Insufficient documentation

## 2016-05-14 MED ORDER — OXYCODONE-ACETAMINOPHEN 5-325 MG PO TABS
1.0000 | ORAL_TABLET | Freq: Once | ORAL | Status: AC
  Administered 2016-05-14: 1 via ORAL
  Filled 2016-05-14: qty 1

## 2016-05-14 MED ORDER — IBUPROFEN 100 MG/5ML PO SUSP: 600.0000 mg | Freq: Once | ORAL | Status: DC

## 2016-05-14 MED ORDER — TRAMADOL HCL 50 MG PO TABS: 50.0000 mg | ORAL_TABLET | Freq: Four times a day (QID) | ORAL | 0 refills | Status: DC | PRN

## 2016-05-14 MED ORDER — IBUPROFEN 600 MG PO TABS
600.0000 mg | ORAL_TABLET | Freq: Once | ORAL | Status: AC
  Administered 2016-05-14: 600 mg via ORAL
  Filled 2016-05-14: qty 1

## 2016-05-14 MED ORDER — IBUPROFEN 600 MG PO TABS: 600.0000 mg | ORAL_TABLET | Freq: Three times a day (TID) | ORAL | 0 refills | Status: DC | PRN

## 2016-05-14 NOTE — ED Notes (Signed)
See triage note  States he was playing softball last pm and the ball hit him in the left ankle  Having pain to left lateral ankle area min swelling  No deformity noted

## 2016-05-14 NOTE — ED Triage Notes (Signed)
Pt c/o left ankle pain from injury incurred playing softball last night; some swelling; no obvious deformity; unable to bear weight; pulses palpable; foot warm to touch

## 2016-05-14 NOTE — ED Provider Notes (Signed)
[facility] Emergency Department Provider Note   ____________________________________________   First MD Initiated Contact with Patient 05/14/16 0710     (approximate)  I have reviewed the triage vital signs and the nursing notes.   HISTORY  Chief Complaint Ankle Pain    HPI Ronald [name]. is a 41 y.o. male patient complaining of left ankle pain secondary to being hit by a softball last night.Patient states pain and swelling to the lateral aspect of the ankle and does acute foot. Patient stated pain increases with ambulation. Patient rates his pain as a 10 over 10. Patient described a pain as "throbbing". No palliative measures prior to arrival.   Past Medical History:  Diagnosis Date  . Anxiety   . Depression     There are no active problems to display for this patient.   History reviewed. No pertinent surgical history.  Prior to Admission medications   Medication Sig Start Date End Date Taking? Authorizing Provider  clonazePAM (KLONOPIN) 0.5 MG tablet Take 0.5 mg by mouth 2 (two) times daily.   Yes Historical Provider, MD  ibuprofen (ADVIL,MOTRIN) 600 MG tablet Take 1 tablet (600 mg total) by mouth every 8 (eight) hours as needed. 05/14/16   Joni Reining, PA-C  traMADol (ULTRAM) 50 MG tablet Take 1 tablet (50 mg total) by mouth every 6 (six) hours as needed for moderate pain. 05/14/16   Joni Reining, PA-C    Allergies Patient has no known allergies.  History reviewed. No pertinent family history.  Social History Social History  Substance Use Topics  . Smoking status: Current Every Day Smoker    Packs/day: 1.00    Types: Cigarettes  . Smokeless tobacco: Never Used  . Alcohol use Yes     Comment: occasional    Review of Systems Constitutional: No fever/chills Eyes: No visual changes. ENT: No sore throat. Cardiovascular: Denies chest pain. Respiratory: Denies shortness of breath. Gastrointestinal: No abdominal pain.  No  nausea, no vomiting.  No diarrhea.  No constipation. Genitourinary: Negative for dysuria. Musculoskeletal: Left ankle foot pain  Skin: Negative for rash. Neurological: Negative for headaches, focal weakness or numbness. Psychiatric:Anxiety and depression   ____________________________________________   PHYSICAL EXAM:  VITAL SIGNS: ED Triage Vitals  Enc Vitals Group     BP 05/14/16 0522 (!) 145/89     Pulse Rate 05/14/16 0522 89     Resp 05/14/16 0522 18     Temp 05/14/16 0522 98 F (36.7 C)     Temp Source 05/14/16 0522 Oral     SpO2 05/14/16 0522 98 %     Weight 05/14/16 0516 185 lb (83.9 kg)     Height 05/14/16 0516  (1.727 m)     Head Circumference --      Peak Flow --      Pain Score 05/14/16 0516 10     Pain Loc --      Pain Edu? --      Excl. in GC? --     Constitutional: Alert and oriented. Well appearing and in no acute distress. Eyes: Conjunctivae are normal. PERRL. EOMI. Head: Atraumatic. Nose: No congestion/rhinnorhea. Mouth/Throat: Mucous membranes are moist.  Oropharynx non-erythematous. Neck: No stridor.  No cervical spine tenderness to palpation. Hematological/Lymphatic/Immunilogical: No cervical lymphadenopathy. Cardiovascular: Normal rate, regular rhythm. Grossly normal heart sounds.  Good peripheral circulation. Respiratory: Normal respiratory effort.  No retractions. Lungs CTAB. Gastrointestinal: Soft and nontender. No distention. No abdominal bruits. No CVA tenderness.  Musculoskeletal:No obvious deformity to the left ankle. Patient tender palpation lateral malleolus dose aspect the left foot.  Neurologic:  Normal speech and language. No gross focal neurologic deficits are appreciated. No gait instability. Skin:  Skin is warm, dry and intact. No rash noted. Psychiatric: Mood and affect are normal. Speech and behavior are normal.  ____________________________________________   LABS (all labs ordered are listed, but only abnormal results are  displayed)  Labs Reviewed - No data to display ____________________________________________  EKG   ____________________________________________  RADIOLOGY  No acute findings x-ray of the left ankle. ____________________________________________   PROCEDURES  Procedure(s) performed: None  Procedures  Critical Care performed: No  ____________________________________________   INITIAL IMPRESSION / ASSESSMENT AND PLAN / ED COURSE  Pertinent labs & imaging results that were available during my care of the patient were reviewed by me and considered in my medical decision making (see chart for details).  Left ankle pain secondary to contusion.      ____________________________________________   FINAL CLINICAL IMPRESSION(S) / ED DIAGNOSES  Final diagnoses:  Acute left ankle pain  Discussed negative x-ray finding with patient. Patient given discharge care instructions. Patient given a work note. Ace wrap was applied to ankle. Patient given crutches to assist with ambulation. Patient given a work note for 2 days. Patient advised follow-up family doctor condition persists.  NEW MEDICATIONS STARTED DURING THIS VISIT:  New Prescriptions   IBUPROFEN (ADVIL,MOTRIN) 600 MG TABLET    Take 1 tablet (600 mg total) by mouth every 8 (eight) hours as needed.   TRAMADOL (ULTRAM) 50 MG TABLET    Take 1 tablet (50 mg total) by mouth every 6 (six) hours as needed for moderate pain.     Note:  This document was prepared using Dragon voice recognition software and may include unintentional dictation errors.    Joni Reining, PA-C 05/14/16 1610    Jene Every, MD 05/15/16 [phone]

## 2016-05-14 NOTE — ED Notes (Signed)
Pt waiting patiently for treatment room; understands flex area opens at 7 and he will be seen in that treatment area;

## 2018-09-08 ENCOUNTER — Emergency Department
Admission: EM | Admit: 2018-09-08 | Discharge: 2018-09-08 | Disposition: A | Discharge status: Other Acute Inpatient Hospital | Payer: Self-pay | Attending: Student | Admitting: Student

## 2018-09-08 ENCOUNTER — Encounter: Payer: Self-pay | Admitting: Emergency Medicine

## 2018-09-08 ENCOUNTER — Other Ambulatory Visit: Payer: Self-pay

## 2018-09-08 DIAGNOSIS — F17210 Nicotine dependence, cigarettes, uncomplicated: Secondary | ICD-10-CM | POA: Insufficient documentation

## 2018-09-08 DIAGNOSIS — R45851 Suicidal ideations: Secondary | ICD-10-CM | POA: Insufficient documentation

## 2018-09-08 DIAGNOSIS — Z79899 Other long term (current) drug therapy: Secondary | ICD-10-CM | POA: Insufficient documentation

## 2018-09-08 DIAGNOSIS — F332 Major depressive disorder, recurrent severe without psychotic features: Secondary | ICD-10-CM

## 2018-09-08 DIAGNOSIS — F1010 Alcohol abuse, uncomplicated: Secondary | ICD-10-CM | POA: Insufficient documentation

## 2018-09-08 DIAGNOSIS — F322 Major depressive disorder, single episode, severe without psychotic features: Secondary | ICD-10-CM | POA: Insufficient documentation

## 2018-09-08 DIAGNOSIS — Z20828 Contact with and (suspected) exposure to other viral communicable diseases: Secondary | ICD-10-CM | POA: Insufficient documentation

## 2018-09-08 HISTORY — DX: Post-traumatic stress disorder, unspecified: F43.10

## 2018-09-08 LAB — COMPREHENSIVE METABOLIC PANEL
ALT: 29 U/L (ref 0–44)
AST: 22 U/L (ref 15–41)
Albumin: 4.6 g/dL (ref 3.5–5.0)
Alkaline Phosphatase: 103 U/L (ref 38–126)
Anion gap: 11 (ref 5–15)
BUN: 9 mg/dL (ref 6–20)
CO2: 21 mmol/L — ABNORMAL LOW (ref 22–32)
Calcium: 8.6 mg/dL — ABNORMAL LOW (ref 8.9–10.3)
Chloride: 107 mmol/L (ref 98–111)
Creatinine, Ser: 0.88 mg/dL (ref 0.61–1.24)
GFR calc Af Amer: >60 mL/min (ref >60)
GFR calc non Af Amer: >60 mL/min (ref >60)
Glucose, Bld: 125 mg/dL — ABNORMAL HIGH (ref 70–99)
Potassium: 3.7 mmol/L (ref 3.5–5.1)
Sodium: 139 mmol/L (ref 135–145)
Total Bilirubin: 0.3 mg/dL (ref 0.3–1.2)
Total Protein: 7.9 g/dL (ref 6.5–8.1)

## 2018-09-08 LAB — CBC
HCT: 50 % (ref 39.0–52.0)
Hemoglobin: 17 g/dL (ref 13.0–17.0)
MCH: 30.9 pg (ref 26.0–34.0)
MCHC: 34 g/dL (ref 30.0–36.0)
MCV: 90.7 fL (ref 80.0–100.0)
Platelets: 381 10*3/uL (ref 150–400)
RBC: 5.51 MIL/uL (ref 4.22–5.81)
RDW: 12.6 % (ref 11.5–15.5)
WBC: 18.5 10*3/uL — ABNORMAL HIGH (ref 4.0–10.5)
nRBC: 0 % (ref 0.0–0.2)

## 2018-09-08 LAB — URINE DRUG SCREEN, QUALITATIVE (ARMC ONLY)
Amphetamines, Ur Screen: NOT DETECTED
Barbiturates, Ur Screen: NOT DETECTED
Benzodiazepine, Ur Scrn: NOT DETECTED
Cannabinoid 50 Ng, Ur ~~LOC~~: NOT DETECTED
Cocaine Metabolite,Ur ~~LOC~~: NOT DETECTED
MDMA (Ecstasy)Ur Screen: NOT DETECTED
Methadone Scn, Ur: NOT DETECTED
Opiate, Ur Screen: NOT DETECTED
Phencyclidine (PCP) Ur S: NOT DETECTED
Tricyclic, Ur Screen: NOT DETECTED

## 2018-09-08 LAB — ACETAMINOPHEN LEVEL: Acetaminophen (Tylenol), Serum: <10 ug/mL — ABNORMAL LOW (ref 10–30)

## 2018-09-08 LAB — ETHANOL: Alcohol, Ethyl (B): 196 mg/dL — ABNORMAL HIGH (ref <10)

## 2018-09-08 LAB — SARS CORONAVIRUS 2 (TAT 6-24 HRS): SARS Coronavirus 2: NEGATIVE

## 2018-09-08 LAB — SALICYLATE LEVEL: Salicylate Lvl: <7 mg/dL (ref 2.8–30.0)

## 2018-09-08 LAB — SARS CORONAVIRUS 2 BY RT PCR (HOSPITAL ORDER, PERFORMED IN ~~LOC~~ HOSPITAL LAB): SARS Coronavirus 2: NEGATIVE

## 2018-09-08 NOTE — ED Provider Notes (Signed)
[facility] Emergency Department Provider Note  ____________________________________________   First MD Initiated Contact with Patient 09/08/18 0148     (approximate)  I have reviewed the triage vital signs and the nursing notes.   HISTORY  Chief Complaint Psychiatric Evaluation  Level 5 caveat:  history/ROS may limited by acute intoxication  HPI Ronald JollyJames M Capano Jr. is a 43 y.o. male with medical history as listed below which notably includes major depressive disorder with prior suicide attempt by cutting his wrists and prior ECT treatments.  He reports that his family and social situation has been getting worse and worse and his depression has been worsening to the point that he is again having thoughts of suicide.  He is concerned he may be losing his daughter and he feels hopeless and helpless.  He is out of a job and has no insurance  and cannot afford any medication or to go to a psychiatrist.  His symptoms are severe, gradually worsening over time, nothing of the [place]ona Rid[place]aylor makes it better or worse.  He admits to drinking a large amount of alcohol tonight but says this is not a regular thing for him.  He called a friend tonight and expresses suicidal thoughts and the friend called 911.  The patient denies any medical issues.  He denies headache, sore throat, chest pain, shortness of breath, cough, nausea, vomiting, and abdominal pain.  He has not had contact with any patients known to have COVID-19.        Past Medical History:  Diagnosis Date  . Anxiety   . Depression   . PTSD (post-traumatic stress disorder)     There are no active problems to display for this patient.   History reviewed. No pertinent surgical history.  Prior to Admission medications   Medication Sig Start Date End Date Taking? Authorizing Provider  clonazePAM (KLONOPIN) 0.5 MG tablet Take 0.5 mg by mouth 2 (two) times daily.    [provider]  ibuprofen (ADVIL,MOTRIN)  600 MG tablet Take 1 tablet (600 mg total) by mouth every 8 (eight) hours as needed. 05/14/16   Joni ReiningSmith, Ronald K, PA-C  traMADol (ULTRAM) 50 MG tablet Take 1 tablet (50 mg total) by mouth every 6 (six) hours as needed for moderate pain. 05/14/16   Joni ReiningSmith, Ronald K, PA-C    Allergies Patient has no known allergies.  No family history on file.  Social History Social History   Tobacco Use  . Smoking status: Current Every Day Smoker    Packs/day: 1.00    Types: Cigarettes  . Smokeless tobacco: Never Used  Substance Use Topics  . Alcohol use: Yes    Comment: occasional  . Drug use: No    Review of Systems Level 5 caveat:  history/ROS may limited by acute intoxication  Constitutional: No fever/chills Eyes: No visual changes. ENT: No sore throat. Cardiovascular: Denies chest pain. Respiratory: Denies shortness of breath. Gastrointestinal: No abdominal pain.  No nausea, no vomiting.  No diarrhea.  No constipation. Genitourinary: Negative for dysuria. Musculoskeletal: Negative for neck pain.  Negative for back pain. Integumentary: Negative for rash. Neurological: Negative for headaches, focal weakness or numbness. Psychiatric:  Depression and suicidal thoughts  ____________________________________________   PHYSICAL EXAM:  VITAL SIGNS: ED Triage Vitals  Enc Vitals Group     BP 09/08/18 0049 121/79     Pulse Rate 09/08/18 0049 (!) 110     Resp 09/08/18 0049 18     Temp 09/08/18 0049 98.6 F (  37 C)     Temp Source 09/08/18 0049 Oral     SpO2 09/08/18 0049 97 %     Weight 09/08/18 0048 93 kg (205 lb)     Height 09/08/18 0048 1.727 m (5\' 8" )     Head Circumference --      Peak Flow --      Pain Score 09/08/18 0047 0     Pain Loc --      Pain Edu? --      Excl. in [place]? --     Constitutional: Alert and oriented. Well appearing and in no acute distress. Eyes: Conjunctivae are normal.  Head: Atraumatic. Nose: No congestion/rhinnorhea. Mouth/Throat: Mucous membranes are  moist. Neck: No stridor.  No meningeal signs.   Cardiovascular: Normal rate, regular rhythm. Good peripheral circulation. Grossly normal heart sounds. Respiratory: Normal respiratory effort.  No retractions. No audible wheezing. Gastrointestinal: Soft and nontender. No distention.  Musculoskeletal: No lower extremity tenderness nor edema. No gross deformities of extremities. Neurologic:  Normal speech and language. No gross focal neurologic deficits are appreciated.  Skin:  Skin is warm, dry and intact. No rash noted. Psychiatric: Mood and affect are normal. Speech and behavior are normal.  Patient reports worsening depression over time and now with suicidal thoughts and prior history of suicide attempts and ECT.  ____________________________________________   LABS (all labs ordered are listed, but only abnormal results are displayed)  Labs Reviewed  COMPREHENSIVE METABOLIC PANEL - Abnormal; Notable for the following components:      Result Value   CO2 21 (*)    Glucose, Bld 125 (*)    Calcium 8.6 (*)    All other components within normal limits  ETHANOL - Abnormal; Notable for the following components:   Alcohol, Ethyl (B) 196 (*)    All other components within normal limits  ACETAMINOPHEN LEVEL - Abnormal; Notable for the following components:   Acetaminophen (Tylenol), Serum <10 (*)    All other components within normal limits  CBC - Abnormal; Notable for the following components:   WBC 18.5 (*)    All other components within normal limits  SARS CORONAVIRUS 2  SALICYLATE LEVEL  URINE DRUG SCREEN, QUALITATIVE ([facility] ONLY)   ____________________________________________  EKG  None - EKG not ordered by ED physician ____________________________________________  RADIOLOGY   ED MD interpretation: Notification for imaging  Official radiology report(s): No results found.  ____________________________________________   PROCEDURES   Procedure(s) performed (including  Critical Care):  Procedures   ____________________________________________   INITIAL IMPRESSION / MDM / [place] / ED COURSE  As part of my medical decision making, I reviewed the following data within the [place] notes reviewed and incorporated, Labs reviewed , A consult was requested and obtained from this/these consultant(s) Psychiatry and Notes from prior ED visits   Differential diagnosis includes, but is not limited to, major depressive disorder with suicidal ideation, adjustment disorder, mood disorder.  Patient has no evidence of any acute medical issues at this time other than alcohol intoxication.  He has an extensive history of depression including prior ECT and prior suicide attempt and he is at high risk of trying to kill himself again based on all of the family and social issues.  I am putting him under involuntary commitment and have ordered a tele-psychiatry consult.  He is medically cleared for behavioral medicine placement.  Ethanol level is 196, urine drug screen negative, leukocytosis of unclear significance of 18.5, unremarkable comprehensive metabolic  panel.  Coronavirus swab has been obtained for placement purposes.          ____________________________________________  FINAL CLINICAL IMPRESSION(S) / ED DIAGNOSES  Final diagnoses:  Severe episode of recurrent major depressive disorder, without psychotic features (HCC)  Suicidal ideation     MEDICATIONS GIVEN DURING THIS VISIT:  Medications - No data to display   ED Discharge Orders    None      *Please note:  Ronald JollyJames M Cornman Jr. was evaluated in Emergency Department on 09/08/2018 for the symptoms described in the history of present illness. He was evaluated in the context of the global COVID-19 pandemic, which necessitated consideration that the patient might be at risk for infection with the SARS-CoV-2 virus that causes COVID-19. Institutional protocols and algorithms  that pertain to the evaluation of patients at risk for COVID-19 are in a state of rapid change based on information released by regulatory bodies including the CDC and federal and state organizations. These policies and algorithms were followed during the patient's care in the ED.  Some ED evaluations and interventions may be delayed as a result of limited staffing during the pandemic.*  Note:  This document was prepared using Dragon voice recognition software and may include unintentional dictation errors.   Loleta RoseForbach, Nadiya Pieratt, MD 09/08/18 [phone]

## 2018-09-08 NOTE — ED Triage Notes (Signed)
Patient states that he was drinking tonight and was on the phone with a friend and told that friend that he was going to hurt himself. Patient states that he is " not really" thinking about hurting himself at this time.

## 2018-09-08 NOTE — BH Assessment (Signed)
Assessment Note  Ronald [name]. is an 43 y.o. male. Who present to the emergency department for psychiatric evaluation. Pt with a history of MH concerns as stated below. He shares that he drinks twice per week and that he began drinking on today prior to speaking on the phone with a friend. Pt states that he recalls stating that is tired of being here. He reports that he fell asleep and the police arrived at his home. He shares a history of one previous suicide attempt at age 40. Pt denied any active suicidal thoughts and states that he has had these vague SI off and for years although he denies any intent to harm him self. He states "I have a 19 year old daughter. "Pt is noncompliant with depression medication, he says that he feels better with out them. He has a history of OPT at [facility]. Pt. denies any suicidal ideation, plan or intent at this time. Pt. denies the presence of any auditory or visual hallucinations at this time. Patient denies any other medical complaints.  Diagnosis: Depression   Past Medical History:  Past Medical History:  Diagnosis Date  . Anxiety   . Depression   . PTSD (post-traumatic stress disorder)     History reviewed. No pertinent surgical history.  Family History: No family history on file.  Social History:  reports that he has been smoking cigarettes. He has been smoking about 1.00 pack per day. He has never used smokeless tobacco. He reports current alcohol use. He reports that he does not use drugs.  Additional Social History:  Alcohol / Drug Use Pain Medications: SEE PTA Prescriptions: SEE PTA Over the Counter: SEE PTA History of alcohol / drug use?: Yes Substance #1 Name of Substance 1: Alcohol 1 - Age of First Use: 17 1 - Amount (size/oz): 8-12 beers 1 - Frequency: 2x week 1 - Last Use / Amount: PTA  CIWA: CIWA-Ar BP: 121/79 Pulse Rate: (!) 110 COWS:    Allergies: No Known Allergies  Home Medications: (Not in a hospital admission)   OB/GYN  Status:  No LMP for male patient.  General Assessment Data TTS Assessment: In system Is this a Tele or Face-to-Face Assessment?: Tele Assessment Is this an Initial Assessment or a Re-assessment for this encounter?: Initial Assessment Patient Accompanied by:: N/A Language Other than English: No Living Arrangements: Other (Comment) What gender do you identify as?: Male Marital status: Single Living Arrangements: Alone Can pt return to current living arrangement?: Yes Admission Status: Involuntary Petitioner: ED Attending Is patient capable of signing voluntary admission?: No Referral Source: Self/Family/Friend Insurance type: none   Medical Screening Exam [facility] Walk-in ONLY) Medical Exam completed: Yes  Crisis Care Plan Living Arrangements: Alone Legal Guardian: Other: Name of Psychiatrist: [place] Name of Therapist: none  Education Status Is patient currently in school?: No Is the patient employed, unemployed or receiving disability?: Employed  Risk to self with the past 6 months Suicidal Ideation: No-Not Currently/Within Last 6 Months Has patient been a risk to self within the past 6 months prior to admission? : No Suicidal Intent: No Has patient had any suicidal intent within the past 6 months prior to admission? : No Is patient at risk for suicide?: No, but patient needs Medical Clearance Suicidal Plan?: No Has patient had any suicidal plan within the past 6 months prior to admission? : No Access to Means: No What has been your use of drugs/alcohol within the last 12 months?: alcohol use  How many  times?: 1 Other Self Harm Risks: none Triggers for Past Attempts: Unknown Intentional Self Injurious Behavior: None Family Suicide History: No Persecutory voices/beliefs?: No Depression: Yes Depression Symptoms: Despondent, Loss of interest in usual pleasures, Feeling worthless/self pity Substance abuse history and/or treatment for substance abuse?: Yes(alcohol ) Suicide  prevention information given to non-admitted patients: Yes  Risk to Others within the past 6 months Homicidal Ideation: No Does patient have any lifetime risk of violence toward others beyond the six months prior to admission? : No Thoughts of Harm to Others: No Current Homicidal Intent: No Current Homicidal Plan: No Access to Homicidal Means: No Identified Victim: none History of harm to others?: No Assessment of Violence: None Noted Violent Behavior Description: none  Does patient have access to weapons?: No Criminal Charges Pending?: No Does patient have a court date: No Is patient on probation?: No  Psychosis Hallucinations: None noted Delusions: None noted  Mental Status Report Appearance/Hygiene: In scrubs Eye Contact: Fair Motor Activity: Freedom of movement Speech: Logical/coherent Level of Consciousness: Alert Mood: Depressed Affect: Depressed Anxiety Level: None Thought Processes: Coherent Judgement: Unimpaired Orientation: Place, Person, Time, Situation Obsessive Compulsive Thoughts/Behaviors: None  Cognitive Functioning Concentration: Normal Memory: Recent Intact, Remote Intact Is patient IDD: No Insight: Fair Impulse Control: Fair Appetite: Fair Have you had any weight changes? : No Change Sleep: Decreased Total Hours of Sleep: 6 Vegetative Symptoms: None  ADLScreening [facility] Assessment Services) Patient's cognitive ability adequate to safely complete daily activities?: Yes Patient able to express need for assistance with ADLs?: Yes Independently performs ADLs?: Yes (appropriate for developmental age)  Prior Inpatient Therapy Prior Inpatient Therapy: No  Prior Outpatient Therapy Prior Outpatient Therapy: Yes Prior Therapy Dates: Several months ago Prior Therapy Facilty/Provider(s): [facility] Reason for Treatment: Depression  Does patient have an ACCT team?: No Does patient have Intensive In-House Services?  : No Does patient have [facility] services? :  No Does patient have P4CC services?: No  ADL Screening (condition at time of admission) Patient's cognitive ability adequate to safely complete daily activities?: Yes Patient able to express need for assistance with ADLs?: Yes Independently performs ADLs?: Yes (appropriate for developmental age)         Values / Beliefs Cultural Requests During Hospitalization: None Spiritual Requests During Hospitalization: None Consults Spiritual Care Consult Needed: No Social Work Consult Needed: No Merchant navy officerAdvance Directives (For Healthcare) Does Patient Have a Medical Advance Directive?: No          Disposition:  Disposition Initial Assessment Completed for this Encounter: Yes Patient referred to: Other (Comment)(SOC Consult)  On Site Evaluation by:   Reviewed with Physician:    Asa SaunasShawanna N Kyleigha Markert 09/08/2018 4:27 AM

## 2018-09-08 NOTE — ED Notes (Signed)
Attempting to call report to [facility], have been on hold for 39min will continue to hold ,pt is leaving with the transport officer at present.

## 2018-09-08 NOTE — BH Assessment (Signed)
Patient has been accepted to [facility].  Patient assigned to [facility]. Accepting physician is Dr. Elaina Hoops.  Call report to [phone].  Representative was [employer].   ER Staff is aware of it:  Lattie Haw, ER Secretary  Dr. Corky Downs, ER MD  Colletta Maryland, Patient's Nurse

## 2018-09-08 NOTE — ED Notes (Signed)
[facility]  DEPT  CALLED  FOR TRANSPORT 

## 2018-09-08 NOTE — ED Notes (Signed)
Gave report to [place] at [facility]

## 2018-09-08 NOTE — ED Notes (Signed)
Referral information for Psychiatric Hospitalization faxed to;   [facility] [phone]),   . [place] [facility] [phone] or [phone])  . [facility] [phone]),    . [facility] [phone] -or- [phone]),   . [facility] [phone] or [phone])

## 2018-09-08 NOTE — ED Notes (Signed)
This nurse was placed on hold for a total of 70minutes while attempting to contact them to give report.

## 2018-09-08 NOTE — ED Notes (Signed)
Pt present and ready for [facility] consult

## 2019-11-25 ENCOUNTER — Other Ambulatory Visit: Payer: Self-pay | Admitting: Orthopaedic Surgery

## 2019-11-25 DIAGNOSIS — S62025A Nondisplaced fracture of middle third of navicular [scaphoid] bone of left wrist, initial encounter for closed fracture: Secondary | ICD-10-CM

## 2019-12-07 ENCOUNTER — Ambulatory Visit: Payer: BC Managed Care – PPO

## 2020-01-03 ENCOUNTER — Other Ambulatory Visit: Payer: Self-pay

## 2020-01-03 ENCOUNTER — Emergency Department
Admission: EM | Admit: 2020-01-03 | Discharge: 2020-01-03 | Disposition: A | Discharge status: Home / Self Care | Payer: BC Managed Care – PPO | Attending: Emergency Medicine | Admitting: Emergency Medicine

## 2020-01-03 ENCOUNTER — Emergency Department: Payer: BC Managed Care – PPO

## 2020-01-03 DIAGNOSIS — K219 Gastro-esophageal reflux disease without esophagitis: Secondary | ICD-10-CM | POA: Diagnosis not present

## 2020-01-03 DIAGNOSIS — F17210 Nicotine dependence, cigarettes, uncomplicated: Secondary | ICD-10-CM | POA: Diagnosis not present

## 2020-01-03 DIAGNOSIS — R079 Chest pain, unspecified: Secondary | ICD-10-CM | POA: Diagnosis present

## 2020-01-03 LAB — BASIC METABOLIC PANEL
Anion gap: 11 (ref 5–15)
BUN: 6 mg/dL (ref 6–20)
CO2: 22 mmol/L (ref 22–32)
Calcium: 8.9 mg/dL (ref 8.9–10.3)
Chloride: 103 mmol/L (ref 98–111)
Creatinine, Ser: 0.8 mg/dL (ref 0.61–1.24)
GFR, Estimated: >60 mL/min (ref >60)
Glucose, Bld: 107 mg/dL — ABNORMAL HIGH (ref 70–99)
Potassium: 3.5 mmol/L (ref 3.5–5.1)
Sodium: 136 mmol/L (ref 135–145)

## 2020-01-03 LAB — CBC
HCT: 45.3 % (ref 39.0–52.0)
Hemoglobin: 15.7 g/dL (ref 13.0–17.0)
MCH: 30.6 pg (ref 26.0–34.0)
MCHC: 34.7 g/dL (ref 30.0–36.0)
MCV: 88.3 fL (ref 80.0–100.0)
Platelets: 414 10*3/uL — ABNORMAL HIGH (ref 150–400)
RBC: 5.13 MIL/uL (ref 4.22–5.81)
RDW: 12.1 % (ref 11.5–15.5)
WBC: 18.8 10*3/uL — ABNORMAL HIGH (ref 4.0–10.5)
nRBC: 0 % (ref 0.0–0.2)

## 2020-01-03 LAB — TROPONIN I (HIGH SENSITIVITY): Troponin I (High Sensitivity): 3 ng/L (ref <18)

## 2020-01-03 MED ORDER — CALCIUM CARBONATE ANTACID 500 MG PO CHEW: 2.0000 | CHEWABLE_TABLET | Freq: Three times a day (TID) | ORAL | 0 refills | Status: AC | PRN

## 2020-01-03 MED ORDER — FAMOTIDINE 20 MG PO TABS: 20.0000 mg | ORAL_TABLET | Freq: Two times a day (BID) | ORAL | 0 refills | Status: DC

## 2020-01-03 NOTE — ED Notes (Signed)
Per Dr. Scotty Court, pt does not need 2nd troponin level drawn.

## 2020-01-03 NOTE — ED Triage Notes (Signed)
Pt c/o of chest pain that started this morning. Pt states he has anxiety that can make his BP elevated. Pt states pain is making everything tight and has the shakes. Pt is unaware of cardiac hx but states it runs in his family. Pt reports that BP PTA was 181/125. Current BP is 174/114.

## 2020-01-03 NOTE — ED Provider Notes (Signed)
[facility] Emergency Department Provider Note  ____________________________________________  Time seen: Approximately 8:58 AM  I have reviewed the triage vital signs and the nursing notes.   HISTORY  Chief Complaint Chest Pain    HPI Ronald [name]. is a 44 y.o. male with a history of anxiety depression and PTSD who comes the ED complaining of lower middle chest pain/epigastric pain that started this morning at about 4:30 AM while lying down on the couch watching TV .  Felt like burning, radiating upward, no shortness of breath diaphoresis or vomiting.  Not exertional, not pleuritic.  No fevers or chills palpitations dizziness or syncope.  No aggravating or alleviating factors.  He notes that he started to feel very anxious after the pain started as well.  Since arrival to the ED, pain has resolved.     Past Medical History:  Diagnosis Date  . Anxiety   . Depression   . PTSD (post-traumatic stress disorder)      There are no problems to display for this patient.    No past surgical history on file.   Prior to Admission medications   Medication Sig Start Date End Date Taking? Authorizing Provider  calcium carbonate (TUMS) 500 MG chewable tablet Chew 2 tablets (400 mg of elemental calcium total) by mouth 3 (three) times daily as needed for indigestion or heartburn. 01/03/20 01/02/21  Sharman Cheek, MD  clonazePAM (KLONOPIN) 0.5 MG tablet Take 0.5 mg by mouth 2 (two) times daily.    [provider]  famotidine (PEPCID) 20 MG tablet Take 1 tablet (20 mg total) by mouth 2 (two) times daily. 01/03/20   Sharman Cheek, MD  ibuprofen (ADVIL,MOTRIN) 600 MG tablet Take 1 tablet (600 mg total) by mouth every 8 (eight) hours as needed. 05/14/16   Joni Reining, PA-C  traMADol (ULTRAM) 50 MG tablet Take 1 tablet (50 mg total) by mouth every 6 (six) hours as needed for moderate pain. 05/14/16   Joni Reining, PA-C     Allergies Patient has  no known allergies.   No family history on file.  Social History Social History   Tobacco Use  . Smoking status: Current Every Day Smoker    Packs/day: 1.00    Types: Cigarettes  . Smokeless tobacco: Never Used  Substance Use Topics  . Alcohol use: Yes    Comment: occasional  . Drug use: No    Review of Systems  Constitutional:   No fever or chills.  ENT:   No sore throat. No rhinorrhea. Cardiovascular: Positive chest pain as above without syncope. Respiratory:   No dyspnea or cough. Gastrointestinal:   Negative for abdominal pain, vomiting and diarrhea.  Musculoskeletal:   Negative for focal pain or swelling All other systems reviewed and are negative except as documented above in ROS and HPI.  ____________________________________________   PHYSICAL EXAM:  VITAL SIGNS: ED Triage Vitals  Enc Vitals Group     BP 01/03/20 0623 (!) 174/114     Pulse Rate 01/03/20 0623 98     Resp 01/03/20 0623 20     Temp 01/03/20 0623 97.8 F (36.6 C)     Temp Source 01/03/20 0623 Oral     SpO2 01/03/20 0623 96 %     Weight 01/03/20 0615 195 lb (88.5 kg)     Height 01/03/20 0615 5\' 7"  (1.702 m)     Head Circumference --      Peak Flow --  Pain Score 01/03/20 0614 5     Pain Loc --      Pain Edu? --      Excl. in GC? --     Vital signs reviewed, nursing assessments reviewed.   Constitutional:   Alert and oriented. Non-toxic appearance. Eyes:   Conjunctivae are normal. EOMI. PERRL. ENT      Head:   Normocephalic and atraumatic.      Nose:   Wearing a mask.      Mouth/Throat:   Wearing a mask.      Neck:   No meningismus. Full ROM. Hematological/Lymphatic/Immunilogical:   No cervical lymphadenopathy. Cardiovascular:   RRR. Symmetric bilateral radial and DP pulses.  No murmurs. Cap refill less than 2 seconds. Respiratory:   Normal respiratory effort without tachypnea/retractions. Breath sounds are clear and equal bilaterally. No wheezes/rales/rhonchi. Gastrointestinal:    Soft with mild epigastric tenderness. Non distended. There is no CVA tenderness.  No rebound, rigidity, or guarding.  Musculoskeletal:   Normal range of motion in all extremities. No joint effusions.  No lower extremity tenderness.  No edema. Neurologic:   Normal speech and language.  Motor grossly intact. No acute focal neurologic deficits are appreciated.  Skin:    Skin is warm, dry and intact. No rash noted.  No petechiae, purpura, or bullae.  ____________________________________________    LABS (pertinent positives/negatives) (all labs ordered are listed, but only abnormal results are displayed) Labs Reviewed  BASIC METABOLIC PANEL - Abnormal; Notable for the following components:      Result Value   Glucose, Bld 107 (*)    All other components within normal limits  CBC - Abnormal; Notable for the following components:   WBC 18.8 (*)    Platelets 414 (*)    All other components within normal limits  TROPONIN I (HIGH SENSITIVITY)  TROPONIN I (HIGH SENSITIVITY)   ____________________________________________   EKG  Interpreted by me Normal sinus rhythm rate of 97, normal axis and intervals.  Poor R wave progression.  Normal ST segments and T waves.  No acute ischemic changes.  ____________________________________________    RADIOLOGY  DG Chest 2 View  Result Date: 01/03/2020 CLINICAL DATA:  Chest pain starting this morning. Elevated blood pressure. Current smoker. EXAM: CHEST - 2 VIEW COMPARISON:  05/26/2013 FINDINGS: Normal heart size and pulmonary vascularity. Linear atelectasis or scarring in the lung bases. No airspace disease or consolidation. No pleural effusions. No pneumothorax. Mediastinal contours appear intact. IMPRESSION: Linear atelectasis or scarring in the lung bases. Electronically Signed   By: Burman Nieves M.D.   On: 01/03/2020 06:55     ____________________________________________   PROCEDURES Procedures  ____________________________________________   CLINICAL IMPRESSION / ASSESSMENT AND PLAN / ED COURSE  Medications ordered in the ED: Medications - No data to display  Pertinent labs & imaging results that were available during my care of the patient were reviewed by me and considered in my medical decision making (see chart for details).  Ronald [name]. was evaluated in Emergency Department on 01/03/2020 for the symptoms described in the history of present illness. He was evaluated in the context of the global COVID-19 pandemic, which necessitated consideration that the patient might be at risk for infection with the SARS-CoV-2 virus that causes COVID-19. Institutional protocols and algorithms that pertain to the evaluation of patients at risk for COVID-19 are in a state of rapid change based on information released by regulatory bodies including the CDC and federal and state organizations. These policies  and algorithms were followed during the patient's care in the ED.   Patient presents with atypical lower chest pain/epigastric pain with some mild epigastric tenderness on exam.  I highly suspect GERD/gastritis. Considering the patient's symptoms, medical history, and physical examination today, I have low suspicion for cholecystitis or biliary pathology, pancreatitis, perforation or bowel obstruction, hernia, intra-abdominal abscess, AAA or dissection, volvulus or intussusception, mesenteric ischemia, or appendicitis.  Doubt ACS PE dissection or carditis.  Vital signs unremarkable.  His initial blood pressure was markedly elevated but this was probably exacerbated by his underlying anxiety.  Patient is calm in the ED, feeling better, and blood pressure recheck is 138/94, would not start antihypertensives at this time.  Other vitals unremarkable, EKG nonischemic.  Chest x-ray image viewed by me which is unremarkable,  no pneumonia or pneumothorax, radiology report agrees.  Recommend trial of famotidine and Tums.  Counseled on smoking cessation.  Recommend making an appointment with primary care for monitoring of symptoms and further health maintenance.      ____________________________________________   FINAL CLINICAL IMPRESSION(S) / ED DIAGNOSES    Final diagnoses:  Gastroesophageal reflux disease without esophagitis     ED Discharge Orders         Ordered    famotidine (PEPCID) 20 MG tablet  2 times daily        01/03/20 0857    calcium carbonate (TUMS) 500 MG chewable tablet  3 times daily PRN        01/03/20 0857          Portions of this note were generated with dragon dictation software. Dictation errors may occur despite best attempts at proofreading.   Sharman Cheek, MD 01/03/20 [phone]

## 2020-01-03 NOTE — ED Notes (Signed)
EKG completed and given to provider for review.

## 2021-08-09 ENCOUNTER — Encounter: Payer: Self-pay | Admitting: Emergency Medicine

## 2021-08-09 ENCOUNTER — Other Ambulatory Visit: Payer: Self-pay

## 2021-08-09 DIAGNOSIS — D72829 Elevated white blood cell count, unspecified: Secondary | ICD-10-CM | POA: Diagnosis not present

## 2021-08-09 DIAGNOSIS — K4020 Bilateral inguinal hernia, without obstruction or gangrene, not specified as recurrent: Secondary | ICD-10-CM | POA: Insufficient documentation

## 2021-08-09 DIAGNOSIS — R1032 Left lower quadrant pain: Secondary | ICD-10-CM | POA: Diagnosis present

## 2021-08-09 LAB — COMPREHENSIVE METABOLIC PANEL
ALT: 23 U/L (ref 0–44)
AST: 24 U/L (ref 15–41)
Albumin: 4.5 g/dL (ref 3.5–5.0)
Alkaline Phosphatase: 90 U/L (ref 38–126)
Anion gap: 7 (ref 5–15)
BUN: 10 mg/dL (ref 6–20)
CO2: 22 mmol/L (ref 22–32)
Calcium: 8.7 mg/dL — ABNORMAL LOW (ref 8.9–10.3)
Chloride: 106 mmol/L (ref 98–111)
Creatinine, Ser: 0.85 mg/dL (ref 0.61–1.24)
GFR, Estimated: >60 mL/min (ref >60)
Glucose, Bld: 110 mg/dL — ABNORMAL HIGH (ref 70–99)
Potassium: 3.7 mmol/L (ref 3.5–5.1)
Sodium: 135 mmol/L (ref 135–145)
Total Bilirubin: 0.6 mg/dL (ref 0.3–1.2)
Total Protein: 7.6 g/dL (ref 6.5–8.1)

## 2021-08-09 LAB — CBC WITH DIFFERENTIAL/PLATELET
Abs Immature Granulocytes: 0.07 10*3/uL (ref 0.00–0.07)
Basophils Absolute: 0.1 10*3/uL (ref 0.0–0.1)
Basophils Relative: 1 %
Eosinophils Absolute: 0.1 10*3/uL (ref 0.0–0.5)
Eosinophils Relative: 1 %
HCT: 48.4 % (ref 39.0–52.0)
Hemoglobin: 16.2 g/dL (ref 13.0–17.0)
Immature Granulocytes: 0 %
Lymphocytes Relative: 19 %
Lymphs Abs: 3.4 10*3/uL (ref 0.7–4.0)
MCH: 30.1 pg (ref 26.0–34.0)
MCHC: 33.5 g/dL (ref 30.0–36.0)
MCV: 89.8 fL (ref 80.0–100.0)
Monocytes Absolute: 1.1 10*3/uL — ABNORMAL HIGH (ref 0.1–1.0)
Monocytes Relative: 7 %
Neutro Abs: 12.6 10*3/uL — ABNORMAL HIGH (ref 1.7–7.7)
Neutrophils Relative %: 72 %
Platelets: 318 10*3/uL (ref 150–400)
RBC: 5.39 MIL/uL (ref 4.22–5.81)
RDW: 12.8 % (ref 11.5–15.5)
WBC: 17.4 10*3/uL — ABNORMAL HIGH (ref 4.0–10.5)
nRBC: 0 % (ref 0.0–0.2)

## 2021-08-09 NOTE — ED Triage Notes (Signed)
Patient ambulatory to triage with steady gait, without difficulty or distress noted; pt reports knot to left side pubic region with pain to left buttock & testicle that began yesterday; denies any swelling. Denies any accomp symptoms; denies any heavy lifting, no hx of same

## 2021-08-09 NOTE — ED Provider Triage Note (Signed)
Emergency Medicine Provider Triage Evaluation Note  Ronald [name]., a 46 y.o. male  was evaluated in triage.  Pt complains of recent change to a right "knot" to his left inguinal region.  Patient reports referred pain to his left buttocks and left testicle that began yesterday.  Denies any associated swelling, dysuria, hematuria, urinary retention, or bowel changes.  Denies any recent strenuous activities.  Review of Systems  Positive: Left inguinal groin and testicle pain Negative: Dysuria, hematuria, urinary retention  Physical Exam  BP (!) 149/100 (BP Location: Left Arm)   Pulse 98   Temp 98 F (36.7 C) (Oral)   Resp 18   Ht 5\' 8"  (1.727 m)   Wt 90.7 kg   SpO2 95%   BMI 30.41 kg/m  Gen:   Awake, no distress NAD Resp:  Normal effort CTA MSK:   Moves extremities without difficulty  ABD:  Soft, nontender  Medical Decision Making  Medically screening exam initiated at 11:41 PM.  Appropriate orders placed.  . was informed that the remainder of the evaluation will be completed by another provider, this initial triage assessment does not replace that evaluation, and the importance of remaining in the ED until their evaluation is complete.  Patient to the ED for evaluation of left testicular and inguinal pain.   [name], PA-C 08/09/21 2346

## 2021-08-10 ENCOUNTER — Emergency Department: Payer: BC Managed Care – PPO

## 2021-08-10 ENCOUNTER — Emergency Department
Admission: EM | Admit: 2021-08-10 | Discharge: 2021-08-10 | Disposition: A | Discharge status: Home / Self Care | Payer: BC Managed Care – PPO | Attending: Emergency Medicine | Admitting: Emergency Medicine

## 2021-08-10 DIAGNOSIS — K4020 Bilateral inguinal hernia, without obstruction or gangrene, not specified as recurrent: Secondary | ICD-10-CM

## 2021-08-10 LAB — URINALYSIS, ROUTINE W REFLEX MICROSCOPIC
Bilirubin Urine: NEGATIVE
Glucose, UA: NEGATIVE mg/dL
Hgb urine dipstick: NEGATIVE
Ketones, ur: NEGATIVE mg/dL
Leukocytes,Ua: NEGATIVE
Nitrite: NEGATIVE
Protein, ur: NEGATIVE mg/dL
Specific Gravity, Urine: 1.008 (ref 1.005–1.030)
pH: 5 (ref 5.0–8.0)

## 2021-08-10 LAB — LACTIC ACID, PLASMA: Lactic Acid, Venous: 1.3 mmol/L (ref 0.5–1.9)

## 2021-08-10 MED ORDER — OXYCODONE HCL 5 MG PO TABS: 5.0000 mg | ORAL_TABLET | Freq: Three times a day (TID) | ORAL | 0 refills | Status: DC | PRN

## 2021-08-10 MED ORDER — IOHEXOL 300 MG/ML  SOLN
100.0000 mL | Freq: Once | INTRAMUSCULAR | Status: AC | PRN
  Administered 2021-08-10: 100 mL via INTRAVENOUS

## 2021-08-10 MED ORDER — ONDANSETRON 4 MG PO TBDP: 4.0000 mg | ORAL_TABLET | Freq: Three times a day (TID) | ORAL | 0 refills | Status: DC | PRN

## 2021-08-10 NOTE — ED Notes (Signed)
EDP, Kadie Balestrieri at bedside; update provided.

## 2021-08-10 NOTE — ED Notes (Signed)
Patient transported to CT 

## 2021-08-10 NOTE — ED Notes (Signed)
Pt back from ct at this time.

## 2021-08-10 NOTE — Discharge Instructions (Addendum)
Please follow-up with the surgeon in the clinic to discuss elective hernia repair.  Return to the ED with any worsening symptoms in the meantime.  Please take Tylenol and ibuprofen/Advil for your pain.  It is safe to take them together, or to alternate them every few hours.  Take up to 1000mg  of Tylenol at a time, up to 4 times per day.  Do not take more than 4000 mg of Tylenol in 24 hours.  For ibuprofen, take 400-600 mg, 3 - 4 times per day.  Use oxycodone as needed for more severe/breakthrough pain.  Do not drive or operate machinery while taking this medication.  Use Zofran as needed for any nausea and vomiting.

## 2021-08-10 NOTE — ED Provider Notes (Signed)
[facility] Provider Note    Event Date/Time   First MD Initiated Contact with Patient 08/10/21 [phone]     (approximate)   History   Groin Pain   HPI  Ronald [name]. is a 46 y.o. male who presents to the ED for evaluation of Groin Pain   I reviewed clinic visit from 4/11.  Seen for URI.  History of anxiety, depression and PTSD.  Patient presents to the ED for the ration of acute on chronic fullness to his left groin.  He reports has been this way for a while but the pain has been increasing just for the past couple days.  No intra-abdominal surgery history.  Reports his brother had an inguinal hernia that required surgical repair.  Last bowel movement was 10 or 11 AM on 7/5 and reportedly "normal.  "Uncertain if he has been passing flatus since then.  No emesis, dysuria, hematuria or difficulty voiding.  Reports the pain is little bit better now than what it was earlier today.  Denies my offer for analgesic medications.   Physical Exam   Triage Vital Signs: ED Triage Vitals  Enc Vitals Group     BP 08/09/21 2328 (!) 149/100     Pulse Rate 08/09/21 2328 98     Resp 08/09/21 2328 18     Temp 08/09/21 2328 98 F (36.7 C)     Temp Source 08/09/21 2328 Oral     SpO2 08/09/21 2328 95 %     Weight 08/09/21 2331 200 lb (90.7 kg)     Height 08/09/21 2331 5\' 8"  (1.727 m)     Head Circumference --      Peak Flow --      Pain Score 08/09/21 2330 7     Pain Loc --      Pain Edu? --      Excl. in [place]? --     Most recent vital signs: Vitals:   08/09/21 2328 08/10/21 0223  BP: (!) 149/100 (!) 156/104  Pulse: 98 74  Resp: 18 16  Temp: 98 F (36.7 C)   SpO2: 95% 95%    General: Awake, no distress.  CV:  Good peripheral perfusion.  Resp:  Normal effort.  Abd:  No distention.  Diffuse lower tenderness without peritoneal features. Normal-appearing external genitalia without penile discharge, lesions, skin changes.  No scrotal skin changes, swelling  or tenderness.  Bilateral distended testicles are smooth, round and without tenderness or nodularity. Left inguinal canal has a firm tubelike structure that I can palpate that is tender to palpation.  No overlying skin changes. MSK:  No deformity noted.  Neuro:  No focal deficits appreciated. Other:     ED Results / Procedures / Treatments   Labs (all labs ordered are listed, but only abnormal results are displayed) Labs Reviewed  CBC WITH DIFFERENTIAL/PLATELET - Abnormal; Notable for the following components:      Result Value   WBC 17.4 (*)    Neutro Abs 12.6 (*)    Monocytes Absolute 1.1 (*)    All other components within normal limits  COMPREHENSIVE METABOLIC PANEL - Abnormal; Notable for the following components:   Glucose, Bld 110 (*)    Calcium 8.7 (*)    All other components within normal limits  URINALYSIS, ROUTINE W REFLEX MICROSCOPIC - Abnormal; Notable for the following components:   Color, Urine STRAW (*)    APPearance CLEAR (*)    All other components within  normal limits  LACTIC ACID, PLASMA  LACTIC ACID, PLASMA    EKG   RADIOLOGY Scrotal ultrasound interpreted by me with left-sided inguinal hernia.  No signs of torsion CT abdomen/pelvis interpreted by me without evidence of SBO  Official radiology report(s): CT ABDOMEN PELVIS W CONTRAST  Result Date: 08/10/2021 CLINICAL DATA:  Knot to the left sided pubic region with pain to the left buttock and testicle. EXAM: CT ABDOMEN AND PELVIS WITH CONTRAST TECHNIQUE: Multidetector CT imaging of the abdomen and pelvis was performed using the standard protocol following bolus administration of intravenous contrast. RADIATION DOSE REDUCTION: This exam was performed according to the departmental dose-optimization program which includes automated exposure control, adjustment of the mA and/or kV according to patient size and/or use of iterative reconstruction technique. CONTRAST:  OMNIPAQUE IOHEXOL 300 MG/ML  SOLN  COMPARISON:  None Available. FINDINGS: Lower chest: No acute abnormality. Hepatobiliary: No focal liver abnormality is seen. No gallstones, gallbladder wall thickening, or biliary dilatation. Pancreas: Unremarkable. No pancreatic ductal dilatation or surrounding inflammatory changes. Spleen: Normal in size without focal abnormality. Adrenals/Urinary Tract: Adrenal glands are unremarkable. Kidneys are normal in size, without renal calculi or hydronephrosis. A 5 mm simple cyst is seen within the anteromedial aspect of the mid to upper left kidney. Bladder is unremarkable. Stomach/Bowel: Stomach is within normal limits. Appendix appears normal. No evidence of bowel wall thickening, distention, or inflammatory changes. Vascular/Lymphatic: No significant vascular findings are present. No enlarged abdominal or pelvic lymph nodes. Reproductive: Prostate is unremarkable. Other: There is a 2.6 cm x 1.8 cm fat containing right inguinal hernia. A 3.7 cm x 2.7 cm fat containing left inguinal hernia is also seen. This extends inferiorly into the superior aspect of the scrotum on the left. No associated inflammatory fat stranding or fluid is identified. A 1.1 cm x 1.0 cm fat containing umbilical hernia is noted. No abdominopelvic ascites. Musculoskeletal: No acute or significant osseous findings. IMPRESSION: 1. 3.7 cm x 2.7 cm fat containing left inguinal hernia which extends inferiorly into the superior aspect of the scrotum on the left. 2. Small fat containing right inguinal hernia. Electronically Signed   By: Aram Candela M.D.   On: 08/10/2021 03:15   US SCROTUM W/DOPPLER  Result Date: 08/10/2021 CLINICAL DATA:  Left testicular pain and swelling x1 day. EXAM: SCROTAL ULTRASOUND DOPPLER ULTRASOUND OF THE TESTICLES TECHNIQUE: Complete ultrasound examination of the testicles, epididymis, and other scrotal structures was performed. Color and spectral Doppler ultrasound were also utilized to evaluate blood flow to the  testicles. COMPARISON:  None Available. FINDINGS: Right testicle Measurements: 4.0 cm x 1.9 cm x 2.9 cm. No mass or microlithiasis visualized. Left testicle Measurements: 3.8 cm x 2.1 cm x 2.2 cm. No mass or microlithiasis visualized. Right epididymis:  Normal in size and appearance. Left epididymis:  Normal in size and appearance. Hydrocele:  Small bilateral hydroceles are noted. Varicocele:  A left-sided varicocele is seen. Pulsed Doppler interrogation of both testes demonstrates normal low resistance arterial and venous waveforms bilaterally. Other: Of incidental note is the presence of a left-sided inguinal hernia. IMPRESSION: 1. Left-sided inguinal hernia. Correlation with physical examination is recommended. 2. Small bilateral hydroceles. 3. Left-sided varicocele. Electronically Signed   By: Aram Candela M.D.   On: 08/10/2021 01:14    PROCEDURES and INTERVENTIONS:  Procedures  Medications  iohexol (OMNIPAQUE) 300 MG/ML solution 100 mL (100 mLs Intravenous Contrast Given 08/10/21 0300)     IMPRESSION / MDM / ASSESSMENT AND PLAN / ED COURSE  I reviewed the triage vital signs and the nursing notes.  Differential diagnosis includes, but is not limited to, incarcerating renal hernia, small bowel obstruction, testicular torsion, cystitis  {Patient presents with symptoms of an acute illness or injury that is potentially life-threatening.  46 year old male without surgical history presents with worsening subacute inguinal swelling and pain, with evidence of inguinal hernias but no evidence of incarceration or emergent surgical pathology, suitable for outpatient surgical follow-up.  Poorly localizing and mild abdominal tenderness.  Inguinal swelling and tenderness is noted, but no penile or scrotal pathology noted.  Blood work with leukocytosis that is nonspecific.  No lactic acidosis or metabolic derangements.  Urine without infectious features.  Started with a scrotal ultrasound without evidence  of significant GU pathology.  Hydrocele and varicoceles noted, but he has no scrotal complaints.  CT abdomen/pelvis obtained and demonstrates fat-containing inguinal hernias without signs of incarceration or SBO.  His symptoms are controlled without medications and he is suitable for outpatient management.  We discussed return precautions and I referred him for surgery.  Clinical Course as of 08/10/21 0438  Thu Aug 10, 2021  0406 Reassessed.  Feeling okay.  We discussed CT results without evidence of incarceration or severe complications.  We discussed surgical referral and following up with them in the clinic.  Discussed return precautions. [DS]    Clinical Course User Index [DS] Delton Prairie, MD     FINAL CLINICAL IMPRESSION(S) / ED DIAGNOSES   Final diagnoses:  Bilateral inguinal hernia without obstruction or gangrene, recurrence not specified     Rx / DC Orders   ED Discharge Orders          Ordered    oxyCODONE (ROXICODONE) 5 MG immediate release tablet  Every 8 hours PRN        08/10/21 0408    ondansetron (ZOFRAN-ODT) 4 MG disintegrating tablet  Every 8 hours PRN        08/10/21 0408             Note:  This document was prepared using Dragon voice recognition software and may include unintentional dictation errors.   Delton Prairie, MD 08/10/21 [phone]

## 2021-08-11 ENCOUNTER — Ambulatory Visit: Payer: Self-pay | Admitting: Surgery

## 2021-08-11 NOTE — H&P (Signed)
Subjective:   CC: Non-recurrent bilateral inguinal hernia without obstruction or gangrene [K40.20], umbilical hernia   HPI:  Ronald Moen. is a 46 y.o. male who was referred by Katrinka Blazing for evaluation of above. Symptoms were first noted 2 years ago. Increasing pain 2 days ago. Pain is sharp and constant, confined to the left groin, without radiation.  Associated with nothing, exacerbated by nothing.  Lump is reducible.    Past Medical History:  has a past medical history of Anxiety, Depression, Insomnia, and PTSD (post-traumatic stress disorder).   Past Surgical History: No past surgical history on file.   Family History: family history includes ADD / ADHD in his half-brother; Alcohol abuse in his maternal grandfather; Anxiety in his father and paternal grandmother; COPD in his maternal grandmother; Depression in his father and paternal grandmother; Dystonia in his father; High blood pressure (Hypertension) in his father; Myocardial Infarction (Heart attack) in his paternal grandfather; No Known Problems in his daughter and mother.   Social History:  reports that he has been smoking cigarettes. He has been smoking an average of 1 pack per day. He has never used smokeless tobacco. He reports current alcohol use. He reports that he does not currently use drugs.   Current Medications: has a current medication list which includes the following prescription(s): clonazepam, benzonatate, hydrocodone-chlorpheniramine, and mirtazapine.   Allergies:  Allergies as of 08/10/2021 - Reviewed 08/10/2021 Allergen Reaction Noted  Buspar [buspirone] Swelling 12/08/2018     ROS:  A 15 point review of systems was performed and pertinent positives and negatives noted in HPI   Objective:   BP (!) 161/133   Pulse 99   Ht 172.7 cm (5\' 8" )   Wt 86.6 kg (191 lb)   BMI 29.04 kg/m    Constitutional :  Alert, cooperative, no distress Lymphatics/Throat:  Supple, no lymphadenopathy Respiratory:  clear to  auscultation bilaterally Cardiovascular:  regular rate and rhythm Gastrointestinal: soft, non-tender; bowel sounds normal; no masses,  no organomegaly. inguinal hernia noted.  small, overlying skin changes, and bilateral and umbilical Musculoskeletal: Steady gait and movement Skin: Cool and moist, no surgical scars Psychiatric: Normal affect, non-agitated, not confused       LABS:  N/a    RADS: N/a Assessment:       Non-recurrent bilateral inguinal hernia without obstruction or gangrene [K40.20]   Umbilical hernia Plan:   1. Non-recurrent bilateral inguinal hernia without obstruction or gangrene [K40.20]   Discussed the risk of surgery including recurrence, which can be up to 50% in the case of incisional or complex hernias, possible use of prosthetic materials (mesh) and the increased risk of mesh infxn if used, bleeding, chronic pain, post-op infxn, post-op SBO or ileus, and possible re-operation to address said risks. The risks of general anesthetic, if used, includes MI, CVA, sudden death or even reaction to anesthetic medications also discussed. Alternatives include continued observation.  Benefits include possible symptom relief, prevention of incarceration, strangulation, enlargement in size over time, and the risk of emergency surgery in the face of strangulation.    Typical post-op recovery time of 3-5 days with 2 weeks of activity restrictions were also discussed.   ED return precautions given for sudden increase in pain, size of hernia with accompanying fever, nausea, and/or vomiting.   The patient verbalized understanding and all questions were answered to the patient's satisfaction.     2. Patient has elected to proceed with surgical treatment. Procedure will be scheduled. bilateral, robotic assisted laparoscopic. Primary umbilical  repair at same time by placing umbo port through it.   labs/images/medications/previous chart entries reviewed personally and relevant  changes/updates noted above.

## 2021-08-11 NOTE — H&P (Signed)
Subjective:  CC: No primary diagnosis found.  HPI:  Ronald [name]. is a 46 y.o. male who was referred by Emergency Room for evaluation of above. Symptoms were first noted 2 years ago. Increasing pain 2 days ago. Pain is sharp and constant, confined to the left groin, without radiation.  Associated with nothing, exacerbated by nothing.  Lump is reducible.    Past Medical History:  has a past medical history of Anxiety, Depression, Insomnia, and PTSD (post-traumatic stress disorder).  Past Surgical History: No past surgical history on file.  Family History: family history includes ADD / ADHD in his half-brother; Alcohol abuse in his maternal grandfather; Anxiety in his father and paternal grandmother; COPD in his maternal grandmother; Depression in his father and paternal grandmother; Dystonia in his father; High blood pressure (Hypertension) in his father; Myocardial Infarction (Heart attack) in his paternal grandfather; No Known Problems in his daughter and mother.  Social History:  reports that he has been smoking cigarettes. He has been smoking an average of 1 pack per day. He has never used smokeless tobacco. He reports current alcohol use. He reports that he does not currently use drugs.  Current Medications: has a current medication list which includes the following prescription(s): clonazepam, benzonatate, hydrocodone-chlorpheniramine, and mirtazapine.  Allergies:  Allergies as of 08/10/2021 - Reviewed 08/10/2021 Allergen Reaction Noted  Buspar [buspirone] Swelling 12/08/2018   ROS:  A 15 point review of systems was performed and pertinent positives and negatives noted in HPI   Objective:    BP (!) 161/133   Pulse 99   Ht 172.7 cm (5\' 8" )   Wt 86.6 kg (191 lb)   BMI 29.04 kg/m   Constitutional :  Alert, cooperative, no distress Lymphatics/Throat:  Supple, no lymphadenopathy Respiratory:  clear to auscultation bilaterally Cardiovascular:  regular rate and  rhythm Gastrointestinal: soft, non-tender; bowel sounds normal; no masses,  no organomegaly. inguinal hernia noted.  small, overlying skin changes, and bilateral and umbilical Musculoskeletal: Steady gait and movement Skin: Cool and moist, no surgical scars Psychiatric: Normal affect, non-agitated, not confused     LABS:  N/a   RADS: N/a Assessment:      No primary diagnosis found.  Plan:    1. No primary diagnosis found.   Discussed the risk of surgery including recurrence, which can be up to 50% in the case of incisional or complex hernias, possible use of prosthetic materials (mesh) and the increased risk of mesh infxn if used, bleeding, chronic pain, post-op infxn, post-op SBO or ileus, and possible re-operation to address said risks. The risks of general anesthetic, if used, includes MI, CVA, sudden death or even reaction to anesthetic medications also discussed. Alternatives include continued observation.  Benefits include possible symptom relief, prevention of incarceration, strangulation, enlargement in size over time, and the risk of emergency surgery in the face of strangulation.   Typical post-op recovery time of 3-5 days with 2 weeks of activity restrictions were also discussed.  ED return precautions given for sudden increase in pain, size of hernia with accompanying fever, nausea, and/or vomiting.  The patient verbalized understanding and all questions were answered to the patient's satisfaction.   2. Patient has elected to proceed with surgical treatment. Procedure will be scheduled. bilateral, robotic assisted laparoscopic. Primary umbilical repair at same time by placing umbo port through it.  labs/images/medications/previous chart entries reviewed personally and relevant changes/updates noted above.

## 2021-08-23 ENCOUNTER — Other Ambulatory Visit: Payer: Self-pay

## 2021-08-23 ENCOUNTER — Encounter
Admission: RE | Admit: 2021-08-23 | Discharge: 2021-08-23 | Disposition: A | Discharge status: Home / Self Care | Payer: BC Managed Care – PPO | Source: Ambulatory Visit | Attending: Surgery | Admitting: Surgery

## 2021-08-23 HISTORY — DX: Gastro-esophageal reflux disease without esophagitis: K21.9

## 2021-08-23 HISTORY — DX: Family history of other specified conditions: Z84.89

## 2021-08-23 NOTE — Patient Instructions (Addendum)
Your procedure is scheduled on: 08/25/21 - Friday Report to the Registration Desk on the 1st floor of the [facility]. To find out your arrival time, please call [phone] between 1PM - 3PM on: 08/24/21 - Thursday If your arrival time is 6:00 am, do not arrive prior to that time as the [facility] entrance doors do not open until 6:00 am.  REMEMBER: Instructions that are not followed completely may result in serious medical risk, up to and including death; or upon the discretion of your surgeon and anesthesiologist your surgery may need to be rescheduled.  Do not eat food after midnight the night before surgery.  No gum chewing, lozengers or hard candies.  You may however, drink CLEAR liquids up to 2 hours before you are scheduled to arrive for your surgery. Do not drink anything within 2 hours of your scheduled arrival time.  Clear liquids include: - water  - apple juice without pulp - gatorade (not RED colors) - black coffee or tea (Do NOT add milk or creamers to the coffee or tea) Do NOT drink anything that is not on this list.   TAKE THESE MEDICATIONS THE MORNING OF SURGERY WITH A SIP OF WATER:  - clonazePAM (KLONOPIN)   One week prior to surgery: Stop Anti-inflammatories (NSAIDS) such as Advil, Aleve, Ibuprofen, Motrin, Naproxen, Naprosyn and Aspirin based products such as Excedrin, Goodys Powder, BC Powder.  Stop ANY OVER THE COUNTER supplements until after surgery.  You may take Tylenol if needed for pain up until the day of surgery.  No Alcohol for 24 hours before or after surgery.  No Smoking including e-cigarettes for 24 hours prior to surgery.  No chewable tobacco products for at least 6 hours prior to surgery.  No nicotine patches on the day of surgery.  Do not use any "recreational" drugs for at least a week prior to your surgery.  Please be advised that the combination of cocaine and anesthesia may have negative outcomes, up to and including death. If you  test positive for cocaine, your surgery will be cancelled.  On the morning of surgery brush your teeth with toothpaste and water, you may rinse your mouth with mouthwash if you wish. Do not swallow any toothpaste or mouthwash.  Use CHG Soap or wipes as directed on instruction sheet.  Do not wear jewelry, make-up, hairpins, clips or nail polish.  Do not wear lotions, powders, or perfumes.   Do not shave body from the neck down 48 hours prior to surgery just in case you cut yourself which could leave a site for infection.  Also, freshly shaved skin may become irritated if using the CHG soap.  Contact lenses, hearing aids and dentures may not be worn into surgery.  Do not bring valuables to the hospital. [facility] is not responsible for any missing/lost belongings or valuables.   Notify your doctor if there is any change in your medical condition (cold, fever, infection).  Wear comfortable clothing (specific to your surgery type) to the hospital.  After surgery, you can help prevent lung complications by doing breathing exercises.  Take deep breaths and cough every 1-2 hours. Your doctor may order a device called an Incentive Spirometer to help you take deep breaths. When coughing or sneezing, hold a pillow firmly against your incision with both hands. This is called "splinting." Doing this helps protect your incision. It also decreases belly discomfort.  If you are being admitted to the hospital overnight, leave your suitcase in  the car. After surgery it may be brought to your room.  If you are being discharged the day of surgery, you will not be allowed to drive home. You will need a responsible adult (18 years or older) to drive you home and stay with you that night.   If you are taking public transportation, you will need to have a responsible adult (18 years or older) with you. Please confirm with your physician that it is acceptable to use public transportation.   Please call  the Pre-admissions Testing Dept. at [phone] if you have any questions about these instructions.  Surgery Visitation Policy:  Patients undergoing a surgery or procedure may have two family members or support persons with them as long as the person is not COVID-19 positive or experiencing its symptoms.   Inpatient Visitation:    Visiting hours are 7 a.m. to 8 p.m. Up to four visitors are allowed at one time in a patient room, including children. The visitors may rotate out with other people during the day. One designated support person (adult) may remain overnight.

## 2021-08-24 ENCOUNTER — Other Ambulatory Visit: Payer: BC Managed Care – PPO

## 2021-08-25 ENCOUNTER — Ambulatory Visit
Admission: RE | Admit: 2021-08-25 | Discharge: 2021-08-25 | Disposition: A | Discharge status: Home / Self Care | Payer: BC Managed Care – PPO | Attending: Surgery | Admitting: Surgery

## 2021-08-25 ENCOUNTER — Other Ambulatory Visit: Payer: Self-pay

## 2021-08-25 ENCOUNTER — Encounter
Admission: RE | Disposition: A | Discharge status: Home / Self Care | Payer: Self-pay | Source: Home / Self Care | Attending: Surgery

## 2021-08-25 ENCOUNTER — Encounter: Payer: Self-pay | Admitting: Surgery

## 2021-08-25 ENCOUNTER — Ambulatory Visit: Payer: BC Managed Care – PPO | Admitting: Urgent Care

## 2021-08-25 DIAGNOSIS — K429 Umbilical hernia without obstruction or gangrene: Secondary | ICD-10-CM | POA: Diagnosis not present

## 2021-08-25 DIAGNOSIS — K219 Gastro-esophageal reflux disease without esophagitis: Secondary | ICD-10-CM | POA: Diagnosis not present

## 2021-08-25 DIAGNOSIS — K4020 Bilateral inguinal hernia, without obstruction or gangrene, not specified as recurrent: Secondary | ICD-10-CM | POA: Diagnosis present

## 2021-08-25 DIAGNOSIS — F17210 Nicotine dependence, cigarettes, uncomplicated: Secondary | ICD-10-CM | POA: Insufficient documentation

## 2021-08-25 HISTORY — PX: INSERTION OF MESH: SHX5868

## 2021-08-25 HISTORY — PX: UMBILICAL HERNIA REPAIR: SHX196

## 2021-08-25 SURGERY — REPAIR, HERNIA, INGUINAL, BILATERAL, ROBOT-ASSISTED
Anesthesia: General | Site: Groin | Laterality: Bilateral

## 2021-08-25 MED ORDER — PROPOFOL 10 MG/ML IV BOLUS
INTRAVENOUS | Status: DC | PRN
  Administered 2021-08-25: 150 mg via INTRAVENOUS

## 2021-08-25 MED ORDER — HYDROMORPHONE HCL 1 MG/ML IJ SOLN
0.5000 mg | INTRAMUSCULAR | Status: DC | PRN
  Administered 2021-08-25: 0.5 mg via INTRAVENOUS

## 2021-08-25 MED ORDER — HYDROCODONE-ACETAMINOPHEN 5-325 MG PO TABS: 1.0000 | ORAL_TABLET | Freq: Three times a day (TID) | ORAL | 0 refills | Status: DC | PRN

## 2021-08-25 MED ORDER — BUPIVACAINE LIPOSOME 1.3 % IJ SUSP
INTRAMUSCULAR | Status: AC
  Filled 2021-08-25: qty 20

## 2021-08-25 MED ORDER — FENTANYL CITRATE (PF) 100 MCG/2ML IJ SOLN
INTRAMUSCULAR | Status: AC
  Administered 2021-08-25: 50 ug via INTRAVENOUS
  Filled 2021-08-25: qty 2

## 2021-08-25 MED ORDER — ROCURONIUM BROMIDE 100 MG/10ML IV SOLN
INTRAVENOUS | Status: DC | PRN
  Administered 2021-08-25: 50 mg via INTRAVENOUS
  Administered 2021-08-25: 10 mg via INTRAVENOUS

## 2021-08-25 MED ORDER — FAMOTIDINE 20 MG PO TABS: 20.0000 mg | ORAL_TABLET | Freq: Once | ORAL | Status: AC

## 2021-08-25 MED ORDER — HYDROMORPHONE HCL 1 MG/ML IJ SOLN
INTRAMUSCULAR | Status: AC
  Administered 2021-08-25: 0.5 mg via INTRAVENOUS
  Filled 2021-08-25: qty 1

## 2021-08-25 MED ORDER — OXYCODONE HCL 5 MG PO TABS
ORAL_TABLET | ORAL | Status: AC
  Administered 2021-08-25: 5 mg via ORAL
  Filled 2021-08-25: qty 1

## 2021-08-25 MED ORDER — CHLORHEXIDINE GLUCONATE CLOTH 2 % EX PADS
6.0000 | MEDICATED_PAD | Freq: Once | CUTANEOUS | Status: AC
  Administered 2021-08-25: 6 via TOPICAL

## 2021-08-25 MED ORDER — CELECOXIB 200 MG PO CAPS: 200.0000 mg | ORAL_CAPSULE | ORAL | Status: AC

## 2021-08-25 MED ORDER — LIDOCAINE HCL (PF) 2 % IJ SOLN
INTRAMUSCULAR | Status: AC
Start: 2021-08-25 — End: ?
  Filled 2021-08-25: qty 5

## 2021-08-25 MED ORDER — CEFAZOLIN SODIUM-DEXTROSE 2-4 GM/100ML-% IV SOLN
INTRAVENOUS | Status: AC
  Filled 2021-08-25: qty 100

## 2021-08-25 MED ORDER — BUPIVACAINE LIPOSOME 1.3 % IJ SUSP
INTRAMUSCULAR | Status: DC | PRN
  Administered 2021-08-25: 20 mL

## 2021-08-25 MED ORDER — TRAMADOL HCL 50 MG PO TABS: 50.0000 mg | ORAL_TABLET | Freq: Four times a day (QID) | ORAL | 0 refills | Status: DC | PRN

## 2021-08-25 MED ORDER — ACETAMINOPHEN 500 MG PO TABS
ORAL_TABLET | ORAL | Status: AC
  Administered 2021-08-25: 1000 mg via ORAL
  Filled 2021-08-25: qty 2

## 2021-08-25 MED ORDER — OXYCODONE HCL 5 MG PO TABS: 5.0000 mg | ORAL_TABLET | Freq: Once | ORAL | Status: AC | PRN

## 2021-08-25 MED ORDER — LIDOCAINE HCL (CARDIAC) PF 100 MG/5ML IV SOSY
PREFILLED_SYRINGE | INTRAVENOUS | Status: DC | PRN
  Administered 2021-08-25: 80 mg via INTRAVENOUS
  Administered 2021-08-25: 20 mg via INTRAVENOUS

## 2021-08-25 MED ORDER — FENTANYL CITRATE (PF) 100 MCG/2ML IJ SOLN
INTRAMUSCULAR | Status: DC | PRN
  Administered 2021-08-25 (×6): 50 ug via INTRAVENOUS

## 2021-08-25 MED ORDER — BUPIVACAINE-EPINEPHRINE 0.5% -1:200000 IJ SOLN
INTRAMUSCULAR | Status: DC | PRN
  Administered 2021-08-25: 30 mL

## 2021-08-25 MED ORDER — LACTATED RINGERS IV SOLN: INTRAVENOUS | Status: DC

## 2021-08-25 MED ORDER — DEXAMETHASONE SODIUM PHOSPHATE 10 MG/ML IJ SOLN
INTRAMUSCULAR | Status: DC | PRN
  Administered 2021-08-25: 10 mg via INTRAVENOUS

## 2021-08-25 MED ORDER — FENTANYL CITRATE (PF) 100 MCG/2ML IJ SOLN
INTRAMUSCULAR | Status: AC
  Filled 2021-08-25: qty 2

## 2021-08-25 MED ORDER — FENTANYL CITRATE (PF) 100 MCG/2ML IJ SOLN
25.0000 ug | INTRAMUSCULAR | Status: DC | PRN
  Administered 2021-08-25: 50 ug via INTRAVENOUS

## 2021-08-25 MED ORDER — ACETAMINOPHEN 10 MG/ML IV SOLN: 1000.0000 mg | Freq: Once | INTRAVENOUS | Status: DC | PRN

## 2021-08-25 MED ORDER — DEXAMETHASONE SODIUM PHOSPHATE 10 MG/ML IJ SOLN
INTRAMUSCULAR | Status: AC
  Filled 2021-08-25: qty 1

## 2021-08-25 MED ORDER — CHLORHEXIDINE GLUCONATE 0.12 % MT SOLN
OROMUCOSAL | Status: AC
  Administered 2021-08-25: 15 mL via OROMUCOSAL
  Filled 2021-08-25: qty 15

## 2021-08-25 MED ORDER — CEFAZOLIN SODIUM-DEXTROSE 2-4 GM/100ML-% IV SOLN
2.0000 g | INTRAVENOUS | Status: AC
  Administered 2021-08-25: 2 g via INTRAVENOUS

## 2021-08-25 MED ORDER — CHLORHEXIDINE GLUCONATE 0.12 % MT SOLN: 15.0000 mL | Freq: Once | OROMUCOSAL | Status: AC

## 2021-08-25 MED ORDER — GABAPENTIN 300 MG PO CAPS: 300.0000 mg | ORAL_CAPSULE | ORAL | Status: AC

## 2021-08-25 MED ORDER — BUPIVACAINE-EPINEPHRINE (PF) 0.5% -1:200000 IJ SOLN
INTRAMUSCULAR | Status: AC
  Filled 2021-08-25: qty 30

## 2021-08-25 MED ORDER — SUGAMMADEX SODIUM 200 MG/2ML IV SOLN
INTRAVENOUS | Status: DC | PRN
  Administered 2021-08-25: 200 mg via INTRAVENOUS

## 2021-08-25 MED ORDER — GABAPENTIN 300 MG PO CAPS
ORAL_CAPSULE | ORAL | Status: AC
  Administered 2021-08-25: 300 mg via ORAL
  Filled 2021-08-25: qty 1

## 2021-08-25 MED ORDER — ONDANSETRON HCL 4 MG/2ML IJ SOLN
INTRAMUSCULAR | Status: AC
  Filled 2021-08-25: qty 2

## 2021-08-25 MED ORDER — PROPOFOL 10 MG/ML IV BOLUS
INTRAVENOUS | Status: AC
  Filled 2021-08-25: qty 20

## 2021-08-25 MED ORDER — ONDANSETRON HCL 4 MG/2ML IJ SOLN
INTRAMUSCULAR | Status: DC | PRN
  Administered 2021-08-25: 4 mg via INTRAVENOUS

## 2021-08-25 MED ORDER — ACETAMINOPHEN 325 MG PO TABS: 650.0000 mg | ORAL_TABLET | Freq: Three times a day (TID) | ORAL | 0 refills | Status: DC | PRN

## 2021-08-25 MED ORDER — MIDAZOLAM HCL 2 MG/2ML IJ SOLN
INTRAMUSCULAR | Status: DC | PRN
  Administered 2021-08-25: 2 mg via INTRAVENOUS

## 2021-08-25 MED ORDER — FAMOTIDINE 20 MG PO TABS
ORAL_TABLET | ORAL | Status: AC
  Administered 2021-08-25: 20 mg via ORAL
  Filled 2021-08-25: qty 1

## 2021-08-25 MED ORDER — CELECOXIB 200 MG PO CAPS
ORAL_CAPSULE | ORAL | Status: AC
  Administered 2021-08-25: 200 mg via ORAL
  Filled 2021-08-25: qty 1

## 2021-08-25 MED ORDER — ACETAMINOPHEN 500 MG PO TABS: 1000.0000 mg | ORAL_TABLET | ORAL | Status: AC

## 2021-08-25 MED ORDER — IBUPROFEN 800 MG PO TABS: 800.0000 mg | ORAL_TABLET | Freq: Three times a day (TID) | ORAL | 0 refills | Status: DC | PRN

## 2021-08-25 MED ORDER — ORAL CARE MOUTH RINSE: 15.0000 mL | Freq: Once | OROMUCOSAL | Status: AC

## 2021-08-25 MED ORDER — ONDANSETRON HCL 4 MG/2ML IJ SOLN: 4.0000 mg | Freq: Once | INTRAMUSCULAR | Status: DC | PRN

## 2021-08-25 MED ORDER — OXYCODONE HCL 5 MG/5ML PO SOLN: 5.0000 mg | Freq: Once | ORAL | Status: AC | PRN

## 2021-08-25 MED ORDER — MIDAZOLAM HCL 2 MG/2ML IJ SOLN
INTRAMUSCULAR | Status: AC
  Filled 2021-08-25: qty 2

## 2021-08-25 MED ORDER — DEXMEDETOMIDINE HCL IN NACL 200 MCG/50ML IV SOLN
INTRAVENOUS | Status: DC | PRN
  Administered 2021-08-25: 8 ug via INTRAVENOUS
  Administered 2021-08-25: 4 ug via INTRAVENOUS
  Administered 2021-08-25: 8 ug via INTRAVENOUS

## 2021-08-25 MED ORDER — DOCUSATE SODIUM 100 MG PO CAPS: 100.0000 mg | ORAL_CAPSULE | Freq: Two times a day (BID) | ORAL | 0 refills | Status: AC | PRN

## 2021-08-25 SURGICAL SUPPLY — 53 items
BAG PRESSURE INF REUSE 1000 (BAG) IMPLANT
BLADE SURG SZ11 CARB STEEL (BLADE) ×3 IMPLANT
BNDG GAUZE DERMACEA FLUFF (GAUZE/BANDAGES/DRESSINGS) ×1
BNDG GAUZE DERMACEA FLUFF 4 (GAUZE/BANDAGES/DRESSINGS) ×2 IMPLANT
COVER TIP SHEARS 8 DVNC (MISCELLANEOUS) ×2 IMPLANT
COVER TIP SHEARS 8MM DA VINCI (MISCELLANEOUS) ×1
COVER WAND RF STERILE (DRAPES) ×3 IMPLANT
DERMABOND ADVANCED (GAUZE/BANDAGES/DRESSINGS) ×1
DERMABOND ADVANCED .7 DNX12 (GAUZE/BANDAGES/DRESSINGS) ×2 IMPLANT
DRAPE ARM DVNC X/XI (DISPOSABLE) ×6 IMPLANT
DRAPE COLUMN DVNC XI (DISPOSABLE) ×2 IMPLANT
DRAPE DA VINCI XI ARM (DISPOSABLE) ×3
DRAPE DA VINCI XI COLUMN (DISPOSABLE) ×1
ELECT CAUTERY BLADE 6.4 (BLADE) IMPLANT
ELECT CAUTERY BLADE TIP 2.5 (TIP) ×3
ELECT REM PT RETURN 9FT ADLT (ELECTROSURGICAL) ×3
ELECTRODE CAUTERY BLDE TIP 2.5 (TIP) IMPLANT
ELECTRODE REM PT RTRN 9FT ADLT (ELECTROSURGICAL) ×2 IMPLANT
GLOVE BIOGEL PI IND STRL 7.0 (GLOVE) ×4 IMPLANT
GLOVE BIOGEL PI INDICATOR 7.0 (GLOVE)
GLOVE SURG SYN 6.5 ES PF (GLOVE) ×24 IMPLANT
GLOVE SURG SYN 6.5 PF PI (GLOVE) ×4 IMPLANT
GOWN STRL REUS W/ TWL LRG LVL3 (GOWN DISPOSABLE) ×6 IMPLANT
GOWN STRL REUS W/TWL LRG LVL3 (GOWN DISPOSABLE) ×4
GRASPER SUT TROCAR 14GX15 (MISCELLANEOUS) ×1 IMPLANT
IRRIGATOR SUCT 8 DISP DVNC XI (IRRIGATION / IRRIGATOR) IMPLANT
IRRIGATOR SUCTION 8MM XI DISP (IRRIGATION / IRRIGATOR)
IV NS 1000ML (IV SOLUTION)
IV NS 1000ML BAXH (IV SOLUTION) IMPLANT
LABEL OR SOLS (LABEL) IMPLANT
MANIFOLD NEPTUNE II (INSTRUMENTS) ×3 IMPLANT
MESH 3DMAX MID 4X6 RT LRG (Mesh General) ×1 IMPLANT
NDL INSUFFLATION 14GA 120MM (NEEDLE) ×2 IMPLANT
NEEDLE HYPO 22GX1.5 SAFETY (NEEDLE) ×3 IMPLANT
NEEDLE INSUFFLATION 14GA 120MM (NEEDLE) ×3 IMPLANT
OBTURATOR OPTICAL STANDARD 8MM (TROCAR) ×1
OBTURATOR OPTICAL STND 8 DVNC (TROCAR) ×2
OBTURATOR OPTICALSTD 8 DVNC (TROCAR) ×2 IMPLANT
PACK LAP CHOLECYSTECTOMY (MISCELLANEOUS) ×3 IMPLANT
PENCIL SMOKE EVACUATOR (MISCELLANEOUS) ×1 IMPLANT
SEAL CANN UNIV 5-8 DVNC XI (MISCELLANEOUS) ×6 IMPLANT
SEAL XI 5MM-8MM UNIVERSAL (MISCELLANEOUS) ×3
SET TUBE SMOKE EVAC HIGH FLOW (TUBING) ×3 IMPLANT
SOLUTION ELECTROLUBE (MISCELLANEOUS) ×3 IMPLANT
SUT MNCRL 4-0 (SUTURE) ×1
SUT MNCRL 4-0 27XMFL (SUTURE) ×2
SUT VIC AB 2-0 SH 27 (SUTURE) ×2
SUT VIC AB 2-0 SH 27XBRD (SUTURE) ×2 IMPLANT
SUT VLOC 90 6 CV-15 VIOLET (SUTURE) ×6 IMPLANT
SUTURE MNCRL 4-0 27XMF (SUTURE) ×2 IMPLANT
SYR 30ML LL (SYRINGE) ×3 IMPLANT
TAPE TRANSPORE STRL 2 31045 (GAUZE/BANDAGES/DRESSINGS) ×3 IMPLANT
WATER STERILE IRR 500ML POUR (IV SOLUTION) ×2 IMPLANT

## 2021-08-25 NOTE — Anesthesia Preprocedure Evaluation (Signed)
Anesthesia Evaluation  Patient identified by MRN, date of birth, ID band Patient awake    Reviewed: Allergy & Precautions, NPO status , Patient's Chart, lab work & pertinent test results  History of Anesthesia Complications Negative for: history of anesthetic complications  Airway Mallampati: II   Neck ROM: Full    Dental  (+) Missing   Pulmonary Current Smoker (1 ppd)Patient did not abstain from smoking.,    Pulmonary exam normal breath sounds clear to auscultation       Cardiovascular Exercise Tolerance: Good negative cardio ROS Normal cardiovascular exam Rhythm:Regular Rate:Normal     Neuro/Psych PSYCHIATRIC DISORDERS (PTSD) Anxiety Depression negative neurological ROS     GI/Hepatic GERD  Controlled,  Endo/Other  negative endocrine ROS  Renal/GU negative Renal ROS     Musculoskeletal   Abdominal   Peds  Hematology negative hematology ROS (+)   Anesthesia Other Findings   Reproductive/Obstetrics                             Anesthesia Physical Anesthesia Plan  ASA: 2  Anesthesia Plan: General   Post-op Pain Management:    Induction: Intravenous  PONV Risk Score and Plan: 1 and Ondansetron, Dexamethasone and Treatment may vary due to age or medical condition  Airway Management Planned: Oral ETT  Additional Equipment:   Intra-op Plan:   Post-operative Plan: Extubation in OR  Informed Consent: I have reviewed the patients History and Physical, chart, labs and discussed the procedure including the risks, benefits and alternatives for the proposed anesthesia with the patient or authorized representative who has indicated his/her understanding and acceptance.     Dental advisory given  Plan Discussed with: CRNA  Anesthesia Plan Comments: (Patient consented for risks of anesthesia including but not limited to:  - adverse reactions to medications - damage to eyes, teeth,  lips or other oral mucosa - nerve damage due to positioning  - sore throat or hoarseness - damage to heart, brain, nerves, lungs, other parts of body or loss of life  Informed patient about role of CRNA in peri- and intra-operative care.  Patient voiced understanding.)        Anesthesia Quick Evaluation

## 2021-08-25 NOTE — Interval H&P Note (Signed)
No change. OK to proceed.

## 2021-08-25 NOTE — Op Note (Signed)
Preoperative diagnosis: Bilateral initial reducible inguinal Hernia, umbilical hernia reducible  Postoperative diagnosis: Bilateral initial reduce inguinal Hernia, reducible umbilical hernia  Procedure: Robotic assisted laparoscopic bilateral inguinal hernia repair with mesh, primary repair of umbilical hernia  Anesthesia: General  Surgeon: Dr. Tonna Boehringer  Wound Classification: Clean  Specimen: none  Complications: None  Estimated Blood Loss: 15mL   Indications:  inguinal and umbilical hernia. Repair was indicated to avoid complications of incarceration, obstruction and pain, and a prosthetic mesh repair was elected.  See H&P for further details.  Findings: 1. Vas Deferens and cord structures identified and preserved 2. Bard 3D max medium weight mesh used for repair 3. Adequate hemostasis achieved 4.  Umbilical defect primarily closed  Description of procedure: The patient was taken to the operating room. A time-out was completed verifying correct patient, procedure, site, positioning, and implant(s) and/or special equipment prior to beginning this procedure.  Area was prepped and draped in the usual sterile fashion. An incision was marked 20 cm above the pubic tubercle, slightly above the umbilicus  Scrotum wrapped in Kerlix roll.  Veress needle inserted at palmer's point.  Saline drop test noted to be positive with gradual increase in pressure after initiation of gas insufflation.  15 mm of pressure was achieved prior to removing the Veress needle and then placing a 8 mm port via the Optiview technique through the palpable umbilical hernia.  Inspection of the area afterwards noted no injury to the surrounding organs during insertion of the needle and the port.  2 port sites were marked 8 cm to the lateral sides of the initial port, and a 8 mm robotic port was placed on the left side, another 8 mm robotic port on the right side under direct supervision.  Local anesthesia  infused to the  preplanned incision sites prior to insertion of the port.  The [employer] platform was then brought into the operative field and docked to the ports.  Examination of the abdominal cavity noted bilateral small inguinal hernias.  Right side addressed first.  A peritoneal flap was created approximately 8cm cephalad to the defect by using scissors with electrocautery.  Dissection was carried down towards the pubic tubercle, developing the myopectineal orifice view.  Laterally the flap was carried towards the ASIS.  Small hernia sac was noted, which carefully dissected away from the adjacent tissues to be fully reduced out of hernia cavity.  Any bleeding was controlled with combination of electrocautery and manual pressure.    After confirming adequate dissection and the peritoneal reflection completely down and away from the cord structures, a Large Bard 3DMax medium weight mesh was placed within the anterior abdominal wall, secured in place using 2-0 Vicryl on an SH needle immediately above the pubic tubercle.  After noting proper placement of the mesh with the peritoneal reflection deep to it, the previously created peritoneal flap was secured back up to the anterior abdominal wall using running 3-0 V-Lock.  Both needles were then removed out of the abdominal cavity.   Left side addressed next. A peritoneal flap was created approximately 8cm cephalad to the defect by using scissors with electrocautery.  Dissection was carried down towards the pubic tubercle, developing the myopectineal orifice view.  Laterally the flap was carried towards the ASIS.  Small hernia sac was noted, which carefully dissected away from the adjacent tissues to be fully reduced out of hernia cavity.  Any bleeding was controlled with combination of electrocautery and manual pressure.    After  confirming adequate dissection and the peritoneal reflection completely down and away from the cord structures, a Large Bard 3DMax medium weight  mesh was placed within the anterior abdominal wall, secured in place using 2-0 Vicryl on an SH needle immediately above the pubic tubercle.  After noting proper placement of the mesh with the peritoneal reflection deep to it, the previously created peritoneal flap was secured back up to the anterior abdominal wall using running 3-0 V-Lock.  Both needles were then removed out of the abdominal cavity, Xi platform undocked from the ports and removed off of operative field.  exparel infused as ilioinguinal block bilaterally.  Umbilical port site removed and the subcentimeter defect was closed primarily using PMI device and 0 Ethibond x1.  Abdomen then desufflated and remaining ports removed. All the skin incisions were then closed with a subcuticular stitch of Monocryl 4-0. Dermabond was applied. The testis was gently pulled down into its anatomic position in the scrotum.  The patient tolerated the procedure well and was taken to the postanesthesia care unit in stable condition. Sponge and instrument count correct at end of procedure.

## 2021-08-25 NOTE — Anesthesia Postprocedure Evaluation (Signed)
Anesthesia Post Note  Patient: Ronald [name].  Procedure(s) Performed: XI ROBOTIC ASSISTED BILATERAL INGUINAL HERNIA (Bilateral: Groin) INSERTION OF MESH HERNIA REPAIR UMBILICAL ADULT  Patient location during evaluation: PACU Anesthesia Type: General Level of consciousness: awake and alert, oriented and patient cooperative Pain management: pain level controlled Vital Signs Assessment: post-procedure vital signs reviewed and stable Respiratory status: spontaneous breathing, nonlabored ventilation and respiratory function stable Cardiovascular status: blood pressure returned to baseline and stable Postop Assessment: adequate PO intake Anesthetic complications: no   No notable events documented.   Last Vitals:  Vitals:   08/25/21 1115 08/25/21 1130  BP: (!) 144/102 (!) 141/102  Pulse: 77 75  Resp: 17 18  Temp:    SpO2: 95% 92%    Last Pain:  Vitals:   08/25/21 1128  TempSrc:   PainSc: 8                  Reed Breech

## 2021-08-25 NOTE — Discharge Instructions (Addendum)
Hernia repair, Care After This sheet gives you information about how to care for yourself after your procedure. Your health care provider may also give you more specific instructions. If you have problems or questions, contact your health care provider. What can I expect after the procedure? After your procedure, it is common to have the following: Pain in your abdomen, especially in the incision areas. You will be given medicine to control the pain. Tiredness. This is a normal part of the recovery process. Your energy level will return to normal over the next several weeks. Changes in your bowel movements, such as constipation or needing to go more often. Talk with your health care provider about how to manage this. Follow these instructions at home: Medicines  tylenol and advil as needed for discomfort.  Please alternate between the two every four hours as needed for pain.    Use narcotics, if prescribed, only when tylenol and motrin is not enough to control pain.  325-650mg every 8hrs to max of 3000mg/24hrs (including the 325mg in every norco dose) for the tylenol.    Advil up to 800mg per dose every 8hrs as needed for pain.   PLEASE RECORD NUMBER OF PILLS TAKEN UNTIL NEXT FOLLOW UP APPT.  THIS WILL HELP DETERMINE HOW READY YOU ARE TO BE RELEASED FROM ANY ACTIVITY RESTRICTIONS Do not drive or use heavy machinery while taking prescription pain medicine. Do not drink alcohol while taking prescription pain medicine.  Incision care    Follow instructions from your health care provider about how to take care of your incision areas. Make sure you: Keep your incisions clean and dry. Wash your hands with soap and water before and after applying medicine to the areas, and before and after changing your bandage (dressing). If soap and water are not available, use hand sanitizer. Change your dressing as told by your health care provider. Leave stitches (sutures), skin glue, or adhesive strips in  place. These skin closures may need to stay in place for 2 weeks or longer. If adhesive strip edges start to loosen and curl up, you may trim the loose edges. Do not remove adhesive strips completely unless your health care provider tells you to do that. Do not wear tight clothing over the incisions. Tight clothing may rub and irritate the incision areas, which may cause the incisions to open. Do not take baths, swim, or use a hot tub until your health care provider approves. OK TO SHOWER IN 24HRS.   Check your incision area every day for signs of infection. Check for: More redness, swelling, or pain. More fluid or blood. Warmth. Pus or a bad smell. Activity Avoid lifting anything that is heavier than 10 lb (4.5 kg) for 2 weeks or until your health care provider says it is okay. No pushing/pulling greater than 30lbs You may resume normal activities as told by your health care provider. Ask your health care provider what activities are safe for you. Take rest breaks during the day as needed. Eating and drinking Follow instructions from your health care provider about what you can eat after surgery. To prevent or treat constipation while you are taking prescription pain medicine, your health care provider may recommend that you: Drink enough fluid to keep your urine clear or pale yellow. Take over-the-counter or prescription medicines. Eat foods that are high in fiber, such as fresh fruits and vegetables, whole grains, and beans. Limit foods that are high in fat and processed sugars, such as fried and   sweet foods. General instructions Ask your health care provider when you will need an appointment to get your sutures or staples removed. Keep all follow-up visits as told by your health care provider. This is important. Contact a health care provider if: You have more redness, swelling, or pain around your incisions. You have more fluid or blood coming from the incisions. Your incisions feel  warm to the touch. You have pus or a bad smell coming from your incisions or your dressing. You have a fever. You have an incision that breaks open (edges not staying together) after sutures or staples have been removed. You develop a rash. You have chest pain or difficulty breathing. You have pain or swelling in your legs. You feel light-headed or you faint. Your abdomen swells (becomes distended). You have nausea or vomiting. You have blood in your stool (feces). This information is not intended to replace advice given to you by your health care provider. Make sure you discuss any questions you have with your health care provider. Document Released: 08/11/2004 Document Revised: 10/11/2017 Document Reviewed: 10/24/2015 Elsevier Interactive Patient Education  2019 [employer]   AMBULATORY SURGERY  DISCHARGE INSTRUCTIONS   The drugs that you were given will stay in your system until tomorrow so for the next 24 hours you should not:  Drive an automobile Make any legal decisions Drink any alcoholic beverage   You may resume regular meals tomorrow.  Today it is better to start with liquids and gradually work up to solid foods.  You may eat anything you prefer, but it is better to start with liquids, then soup and crackers, and gradually work up to solid foods.   Please notify your doctor immediately if you have any unusual bleeding, trouble breathing, redness and pain at the surgery site, drainage, fever, or pain not relieved by medication.    Additional Instructions:   PLEASE DO NOT REMOVE GREEN BRACELET FOR 4 DAYS   Please contact your physician with any problems or Same Day Surgery at [phone], Monday through Friday 6 am to 4 pm, or [place] at [facility] number at [phone].

## 2021-08-25 NOTE — Anesthesia Procedure Notes (Signed)
Procedure Name: Intubation Date/Time: 08/25/2021 9:00 AM  Performed by: Reece Agar, CRNAPre-anesthesia Checklist: Patient identified, Emergency Drugs available, Suction available and Patient being monitored Patient Re-evaluated:Patient Re-evaluated prior to induction Oxygen Delivery Method: Circle system utilized Preoxygenation: Pre-oxygenation with 100% oxygen Induction Type: IV induction Ventilation: Mask ventilation without difficulty Laryngoscope Size: McGraph and 3 Grade View: Grade I Tube type: Oral Tube size: 7.5 mm Number of attempts: 1 Airway Equipment and Method: Stylet and Oral airway Placement Confirmation: ETT inserted through vocal cords under direct vision, positive ETCO2 and breath sounds checked- equal and bilateral Secured at: 23 cm Tube secured with: Tape Dental Injury: Teeth and Oropharynx as per pre-operative assessment

## 2021-08-25 NOTE — Transfer of Care (Signed)
Immediate Anesthesia Transfer of Care Note  Patient: Ronald [name].  Procedure(s) Performed: XI ROBOTIC ASSISTED BILATERAL INGUINAL HERNIA (Bilateral: Groin) INSERTION OF MESH HERNIA REPAIR UMBILICAL ADULT  Patient Location: PACU  Anesthesia Type:General  Level of Consciousness: drowsy  Airway & Oxygen Therapy: Patient Spontanous Breathing and Patient connected to face mask oxygen  Post-op Assessment: Report given to RN and Post -op Vital signs reviewed and stable  Post vital signs: Reviewed and stable  Last Vitals:  Vitals Value Taken Time  BP 133/87 08/25/21 1052  Temp 36.7 C 08/25/21 1052  Pulse 73 08/25/21 1057  Resp 21 08/25/21 1057  SpO2 93 % 08/25/21 1057  Vitals shown include unvalidated device data.  Last Pain:  Vitals:   08/25/21 1052  TempSrc:   PainSc: Asleep         Complications: No notable events documented.

## 2021-08-28 ENCOUNTER — Encounter: Payer: Self-pay | Admitting: Surgery

## 2022-01-06 ENCOUNTER — Ambulatory Visit (INDEPENDENT_AMBULATORY_CARE_PROVIDER_SITE_OTHER): Payer: BC Managed Care – PPO

## 2022-01-06 ENCOUNTER — Ambulatory Visit
Admission: EM | Admit: 2022-01-06 | Discharge: 2022-01-06 | Disposition: A | Discharge status: Home / Self Care | Payer: BC Managed Care – PPO | Attending: Emergency Medicine | Admitting: Emergency Medicine

## 2022-01-06 DIAGNOSIS — R030 Elevated blood-pressure reading, without diagnosis of hypertension: Secondary | ICD-10-CM

## 2022-01-06 DIAGNOSIS — M79672 Pain in left foot: Secondary | ICD-10-CM | POA: Diagnosis not present

## 2022-01-06 NOTE — ED Provider Notes (Signed)
Ronald [name]    CSN: [id number] Arrival date & time: 01/06/22  2751      History   Chief Complaint Chief Complaint  Patient presents with   Foot Pain    HPI Ronald [name]. is a 46 y.o. male.  Patient presents with 3 week history of left foot pain, swelling, bruising.  The pain started after he injured his foot at work; he dropped heavy metal rod on his foot at work twice in one shift.  He also has a small healing sore on the top of his left foot that occurred when he scratched his foot when itching during the night.  No fever, redness, drainage, numbness, weakness, or other symptoms.  No treatment at home.  His medical history includes GERD, depression, anxiety, PTSD.  The history is provided by the patient and medical records.    Past Medical History:  Diagnosis Date   Anxiety    Depression    Family history of adverse reaction to anesthesia    difficult to wake up - Mother   GERD (gastroesophageal reflux disease)    PTSD (post-traumatic stress disorder)     There are no problems to display for this patient.   Past Surgical History:  Procedure Laterality Date   ELECTROCONVULSIVE THERAPY     FRACTURE SURGERY     wrist left   INSERTION OF MESH  08/25/2021   Procedure: INSERTION OF MESH;  Surgeon: Sung Amabile, DO;  Location: [facility] ORS;  Service: General;;   UMBILICAL HERNIA REPAIR  08/25/2021   Procedure: HERNIA REPAIR UMBILICAL ADULT;  Surgeon: Sung Amabile, DO;  Location: [facility] ORS;  Service: General;;       Home Medications    Prior to Admission medications   Medication Sig Start Date End Date Taking? Authorizing Provider  acetaminophen (TYLENOL) 325 MG tablet Take 2 tablets (650 mg total) by mouth every 8 (eight) hours as needed for up to 30 doses for mild pain, moderate pain, fever or headache. 08/25/21   Tonna Boehringer, Isami, DO  clonazePAM (KLONOPIN) 0.5 MG tablet Take 0.5 mg by mouth daily.    [provider]  HYDROcodone-acetaminophen  (NORCO/VICODIN) 5-325 MG tablet Take 1 tablet by mouth every 8 (eight) hours as needed for up to 6 doses for severe pain (not relieved by other meds). 08/25/21   Tonna Boehringer, Isami, DO  ibuprofen (ADVIL) 800 MG tablet Take 1 tablet (800 mg total) by mouth every 8 (eight) hours as needed for up to 30 doses for mild pain or moderate pain. 08/25/21   Sung Amabile, DO    Family History No family history on file.  Social History Social History   Tobacco Use   Smoking status: Every Day    Packs/day: 1.00    Types: Cigarettes   Smokeless tobacco: Never  Vaping Use   Vaping Use: Never used  Substance Use Topics   Alcohol use: Yes    Comment: occasional   Drug use: No     Allergies   Buspar [buspirone]   Review of Systems Review of Systems  Constitutional:  Negative for chills and fever.  Musculoskeletal:  Positive for arthralgias, gait problem and joint swelling.  Skin:  Positive for color change and wound.  Neurological:  Negative for weakness and numbness.  All other systems reviewed and are negative.    Physical Exam Triage Vital Signs ED Triage Vitals  Enc Vitals Group     BP      Pulse  Resp      Temp      Temp src      SpO2      Weight      Height      Head Circumference      Peak Flow      Pain Score      Pain Loc      Pain Edu?      Excl. in GC?    No data found.  Updated Vital Signs BP (!) 150/85   Pulse 80   Temp 97.9 F (36.6 C)   Resp 18   Ht 5\' 8"  (1.727 m)   Wt 180 lb (81.6 kg)   SpO2 97%   BMI 27.37 kg/m   Visual Acuity Right Eye Distance:   Left Eye Distance:   Bilateral Distance:    Right Eye Near:   Left Eye Near:    Bilateral Near:     Physical Exam Vitals and nursing note reviewed.  Constitutional:      General: He is not in acute distress.    Appearance: Normal appearance. He is well-developed. He is not ill-appearing.  HENT:     Mouth/Throat:     Mouth: Mucous membranes are moist.  Cardiovascular:     Rate and Rhythm:  Normal rate and regular rhythm.     Heart sounds: Normal heart sounds.  Pulmonary:     Effort: Pulmonary effort is normal. No respiratory distress.     Breath sounds: Normal breath sounds.  Musculoskeletal:        General: Swelling and tenderness present. No deformity. Normal range of motion.     Cervical back: Neck supple.     Comments: Generalized tenderness to palpation of dorsum of left foot with faint ecchymosis.  0.5 healing lesion on top of foot; no erythema or drainage.    Skin:    General: Skin is warm and dry.     Capillary Refill: Capillary refill takes less than 2 seconds.     Findings: Bruising and lesion present. No erythema or rash.  Neurological:     General: No focal deficit present.     Mental Status: He is alert and oriented to person, place, and time.     Sensory: No sensory deficit.     Motor: No weakness.     Gait: Gait abnormal.     Comments: Limping gait  Psychiatric:        Mood and Affect: Mood normal.        Behavior: Behavior normal.      UC Treatments / Results  Labs (all labs ordered are listed, but only abnormal results are displayed) Labs Reviewed - No data to display  EKG   Radiology DG Foot Complete Left  Result Date: 01/06/2022 CLINICAL DATA:  Left foot pain. EXAM: LEFT FOOT - COMPLETE 3+ VIEW COMPARISON:  None Available. FINDINGS: There is no evidence of fracture or dislocation. Minimal plantar calcaneal spur noted. Soft tissues are unremarkable. IMPRESSION: No acute fracture or dislocation. Minimal plantar calcaneal spur. Electronically Signed   By: 14/03/2021 M.D.   On: 01/06/2022 08:48    Procedures Procedures (including critical care time)  Medications Ordered in UC Medications - No data to display  Initial Impression / Assessment and Plan / UC Course  I have reviewed the triage vital signs and the nursing notes.  Pertinent labs & imaging results that were available during my care of the patient were reviewed by me and  considered  in my medical decision making (see chart for details).   Left foot pain, elevated blood pressure reading.  Patient does not currently have a PCP.  Our nurse scheduled patient with appointment to establish a PCP.  Foot x-ray shows heel spur but no fracture.  Instructed patient to follow-up with an orthopedist.  Contact information for on-call Ortho provided.  Discussed rest, elevation, ice packs, ibuprofen for his foot pain.  So discussed that his blood pressure is elevated today and needs to be rechecked by his new PCP.  Education provided on preventing hypertension.  Patient agrees to plan of care.  Final Clinical Impressions(s) / UC Diagnoses   Final diagnoses:  Left foot pain  Elevated blood pressure reading     Discharge Instructions      Your foot xray shows a heel spur but no fracture. Follow up with an orthopedist if your symptoms are not improving.  Your blood pressure is elevated today at 150/85.  Please have this rechecked by your primary care provider in 2-4 weeks.          ED Prescriptions   None    PDMP not reviewed this encounter.   Mickie Bail, NP 01/06/22 [phone]

## 2022-01-06 NOTE — Discharge Instructions (Addendum)
Your foot xray shows a heel spur but no fracture. Follow up with an orthopedist if your symptoms are not improving.  Your blood pressure is elevated today at 150/85.  Please have this rechecked by your primary care provider in 2-4 weeks.

## 2022-01-06 NOTE — ED Triage Notes (Signed)
Patient to Urgent Care with complaints of left foot pain and swelling x3 weeks.   Reports possibly dropping two heavy items at work onto his foot several weeks ago. Describes pain on the top of his foot and into his heel.

## 2022-01-06 NOTE — ED Notes (Signed)
Patient assisted with establishing PCP. First patient visit made for 02/23/22.

## 2022-01-16 ENCOUNTER — Ambulatory Visit (INDEPENDENT_AMBULATORY_CARE_PROVIDER_SITE_OTHER): Payer: BC Managed Care – PPO | Admitting: Podiatry

## 2022-01-16 ENCOUNTER — Encounter: Payer: Self-pay | Admitting: *Deleted

## 2022-01-16 ENCOUNTER — Ambulatory Visit (INDEPENDENT_AMBULATORY_CARE_PROVIDER_SITE_OTHER): Payer: BC Managed Care – PPO

## 2022-01-16 DIAGNOSIS — M722 Plantar fascial fibromatosis: Secondary | ICD-10-CM | POA: Diagnosis not present

## 2022-01-16 MED ORDER — MELOXICAM 15 MG PO TABS: 15.0000 mg | ORAL_TABLET | Freq: Every day | ORAL | 1 refills | Status: DC

## 2022-01-16 MED ORDER — BETAMETHASONE SOD PHOS & ACET 6 (3-3) MG/ML IJ SUSP
3.0000 mg | Freq: Once | INTRAMUSCULAR | Status: AC
  Administered 2022-01-16: 3 mg via INTRA_ARTICULAR

## 2022-01-16 MED ORDER — METHYLPREDNISOLONE 4 MG PO TBPK: ORAL_TABLET | ORAL | 0 refills | Status: DC

## 2022-01-16 NOTE — Progress Notes (Signed)
Dg  

## 2022-01-16 NOTE — Progress Notes (Signed)
   Chief Complaint  Patient presents with   Foot Pain    Patient has left foot pain, he works 10 hours a day on steel toed shoes.    Subjective: 46 y.o. male presenting today as a new patient for evaluation of severe left heel pain this been going on about 7-8 weeks now.  Idiopathic onset.  Denies a history of injury.  He has been to the urgent care who referred him here.  He works 10-12-hour shifts on his feet all day.  Presenting for further treatment and evaluation   Past Medical History:  Diagnosis Date   Anxiety    Depression    Family history of adverse reaction to anesthesia    difficult to wake up - Mother   GERD (gastroesophageal reflux disease)    PTSD (post-traumatic stress disorder)    Past Surgical History:  Procedure Laterality Date   ELECTROCONVULSIVE THERAPY     FRACTURE SURGERY     wrist left   INSERTION OF MESH  08/25/2021   Procedure: INSERTION OF MESH;  Surgeon: Sung Amabile, DO;  Location: [facility] ORS;  Service: General;;   UMBILICAL HERNIA REPAIR  08/25/2021   Procedure: HERNIA REPAIR UMBILICAL ADULT;  Surgeon: Sung Amabile, DO;  Location: [facility] ORS;  Service: General;;   Allergies  Allergen Reactions   Buspar [Buspirone]      Objective: Physical Exam General: The patient is alert and oriented x3 in no acute distress.  Dermatology: Skin is warm, dry and supple bilateral lower extremities. Negative for open lesions or macerations bilateral.   Vascular: Dorsalis Pedis and Posterior Tibial pulses palpable bilateral.  Capillary fill time is immediate to all digits.  Neurological: Epicritic and protective threshold intact bilateral.   Musculoskeletal: Tenderness to palpation to the plantar aspect of the left heel along the plantar fascia. All other joints range of motion within normal limits bilateral. Strength 5/5 in all groups bilateral.   Radiographic exam LT foot 01/16/2022: Normal osseous mineralization. Joint spaces preserved. No  fracture/dislocation/boney destruction. No other soft tissue abnormalities or radiopaque foreign bodies.  Plantar heel spur noted  Assessment: 1. Plantar fasciitis left foot  Plan of Care:  1. Patient evaluated. Xrays reviewed.   2. Injection of 0.5cc Celestone soluspan injected into the left plantar fascia.  3. Rx for Medrol Dose Pak placed 4. Rx for Meloxicam ordered for patient. 5. Plantar fascial band(s) dispensed  6. Instructed patient regarding therapies and modalities at home to alleviate symptoms.  7.  OTC power step insoles dispensed at checkout.  Wear daily  8.  Return to clinic in 4 weeks.    *Works at [employer] 10-12hrs/shift   Felecia Shelling, DPM [facility]  Dr. Felecia Shelling, DPM    [address] [place], [place] [address]                Office [phone]  Fax [phone]

## 2022-01-23 ENCOUNTER — Telehealth: Payer: Self-pay | Admitting: Podiatry

## 2022-01-23 NOTE — Telephone Encounter (Signed)
I spoke with Marylene Land about this earlier today and will see the patient tomorrow he can be OOW

## 2022-01-23 NOTE — Telephone Encounter (Signed)
I spoke with patient this morning concerning his leave paperwork. Ronald [name] employer will not allow him to work with the restrictions that are in place from Dr. Logan Bores. Ronald [name] is not working at the present time, due to his feet. Is it ok, for me to complete oow forms for him?  Ronald [name] said that he is still in a lot of pain, the things that Dr. Logan Bores told him to do is not working and he would like to try the boot that was recommended. His mother has a boot at home and was wondering if its the same type of boot, can he just use that one? He is scheduled for a appointment tomorrow to discuss I told him to bring the boot with him.

## 2022-01-24 ENCOUNTER — Ambulatory Visit (INDEPENDENT_AMBULATORY_CARE_PROVIDER_SITE_OTHER): Payer: BC Managed Care – PPO | Admitting: Podiatry

## 2022-01-24 ENCOUNTER — Encounter: Payer: Self-pay | Admitting: Podiatry

## 2022-01-24 DIAGNOSIS — M722 Plantar fascial fibromatosis: Secondary | ICD-10-CM

## 2022-01-24 NOTE — Patient Instructions (Signed)
Physical Therapy Options:   - [facility] - [facility]   Plantar Fasciitis (Heel Spur Syndrome) with Rehab The plantar fascia is a fibrous, ligament-like, soft-tissue structure that spans the bottom of the foot. Plantar fasciitis is a condition that causes pain in the foot due to inflammation of the tissue. SYMPTOMS  Pain and tenderness on the underneath side of the foot. Pain that worsens with standing or walking. CAUSES  Plantar fasciitis is caused by irritation and injury to the plantar fascia on the underneath side of the foot. Common mechanisms of injury include: Direct trauma to bottom of the foot. Damage to a small nerve that runs under the foot where the main fascia attaches to the heel bone. Stress placed on the plantar fascia due to bone spurs. RISK INCREASES WITH:  Activities that place stress on the plantar fascia (running, jumping, pivoting, or cutting). Poor strength and flexibility. Improperly fitted shoes. Tight calf muscles. Flat feet. Failure to warm-up properly before activity. Obesity. PREVENTION Warm up and stretch properly before activity. Allow for adequate recovery between workouts. Maintain physical fitness: Strength, flexibility, and endurance. Cardiovascular fitness. Maintain a health body weight. Avoid stress on the plantar fascia. Wear properly fitted shoes, including arch supports for individuals who have flat feet.  PROGNOSIS  If treated properly, then the symptoms of plantar fasciitis usually resolve without surgery. However, occasionally surgery is necessary.  RELATED COMPLICATIONS  Recurrent symptoms that may result in a chronic condition. Problems of the lower back that are caused by compensating for the injury, such as limping. Pain or weakness of the foot during push-off following surgery. Chronic inflammation, scarring, and partial or complete fascia tear, occurring more often from repeated injections.  TREATMENT   Treatment initially involves the use of ice and medication to help reduce pain and inflammation. The use of strengthening and stretching exercises may help reduce pain with activity, especially stretches of the Achilles tendon. These exercises may be performed at home or with a therapist. Your caregiver may recommend that you use heel cups of arch supports to help reduce stress on the plantar fascia. Occasionally, corticosteroid injections are given to reduce inflammation. If symptoms persist for greater than 6 months despite non-surgical (conservative), then surgery may be recommended.   MEDICATION  If pain medication is necessary, then nonsteroidal anti-inflammatory medications, such as aspirin and ibuprofen, or other minor pain relievers, such as acetaminophen, are often recommended. Do not take pain medication within 7 days before surgery. Prescription pain relievers may be given if deemed necessary by your caregiver. Use only as directed and only as much as you need. Corticosteroid injections may be given by your caregiver. These injections should be reserved for the most serious cases, because they may only be given a certain number of times.  HEAT AND COLD Cold treatment (icing) relieves pain and reduces inflammation. Cold treatment should be applied for 10 to 15 minutes every 2 to 3 hours for inflammation and pain and immediately after any activity that aggravates your symptoms. Use ice packs or massage the area with a piece of ice (ice massage). Heat treatment may be used prior to performing the stretching and strengthening activities prescribed by your caregiver, physical therapist, or athletic trainer. Use a heat pack or soak the injury in warm water.  SEEK IMMEDIATE MEDICAL CARE IF: Treatment seems to offer no benefit, or the condition worsens. Any medications produce adverse side effects.  EXERCISES- RANGE OF MOTION (ROM) AND STRETCHING EXERCISES - Plantar Fasciitis (  Heel Spur  Syndrome) These exercises may help you when beginning to rehabilitate your injury. Your symptoms may resolve with or without further involvement from your physician, physical therapist or athletic trainer. While completing these exercises, remember:  Restoring tissue flexibility helps normal motion to return to the joints. This allows healthier, less painful movement and activity. An effective stretch should be held for at least 30 seconds. A stretch should never be painful. You should only feel a gentle lengthening or release in the stretched tissue.  RANGE OF MOTION - Toe Extension, Flexion Sit with your right / left leg crossed over your opposite knee. Grasp your toes and gently pull them back toward the top of your foot. You should feel a stretch on the bottom of your toes and/or foot. Hold this stretch for 10 seconds. Now, gently pull your toes toward the bottom of your foot. You should feel a stretch on the top of your toes and or foot. Hold this stretch for 10 seconds. Repeat  times. Complete this stretch 3 times per day.   RANGE OF MOTION - Ankle Dorsiflexion, Active Assisted Remove shoes and sit on a chair that is preferably not on a carpeted surface. Place right / left foot under knee. Extend your opposite leg for support. Keeping your heel down, slide your right / left foot back toward the chair until you feel a stretch at your ankle or calf. If you do not feel a stretch, slide your bottom forward to the edge of the chair, while still keeping your heel down. Hold this stretch for 10 seconds. Repeat 3 times. Complete this stretch 2 times per day.   STRETCH  Gastroc, Standing Place hands on wall. Extend right / left leg, keeping the front knee somewhat bent. Slightly point your toes inward on your back foot. Keeping your right / left heel on the floor and your knee straight, shift your weight toward the wall, not allowing your back to arch. You should feel a gentle stretch in the  right / left calf. Hold this position for 10 seconds. Repeat 3 times. Complete this stretch 2 times per day.  STRETCH  Soleus, Standing Place hands on wall. Extend right / left leg, keeping the other knee somewhat bent. Slightly point your toes inward on your back foot. Keep your right / left heel on the floor, bend your back knee, and slightly shift your weight over the back leg so that you feel a gentle stretch deep in your back calf. Hold this position for 10 seconds. Repeat 3 times. Complete this stretch 2 times per day.  STRETCH  Gastrocsoleus, Standing  Note: This exercise can place a lot of stress on your foot and ankle. Please complete this exercise only if specifically instructed by your caregiver.  Place the ball of your right / left foot on a step, keeping your other foot firmly on the same step. Hold on to the wall or a rail for balance. Slowly lift your other foot, allowing your body weight to press your heel down over the edge of the step. You should feel a stretch in your right / left calf. Hold this position for 10 seconds. Repeat this exercise with a slight bend in your right / left knee. Repeat 3 times. Complete this stretch 2 times per day.   STRENGTHENING EXERCISES - Plantar Fasciitis (Heel Spur Syndrome)  These exercises may help you when beginning to rehabilitate your injury. They may resolve your symptoms with or without further involvement  from your physician, physical therapist or athletic trainer. While completing these exercises, remember:  Muscles can gain both the endurance and the strength needed for everyday activities through controlled exercises. Complete these exercises as instructed by your physician, physical therapist or athletic trainer. Progress the resistance and repetitions only as guided.  STRENGTH - Towel Curls Sit in a chair positioned on a non-carpeted surface. Place your foot on a towel, keeping your heel on the floor. Pull the towel toward  your heel by only curling your toes. Keep your heel on the floor. Repeat 3 times. Complete this exercise 2 times per day.  STRENGTH - Ankle Inversion Secure one end of a rubber exercise band/tubing to a fixed object (table, pole). Loop the other end around your foot just before your toes. Place your fists between your knees. This will focus your strengthening at your ankle. Slowly, pull your big toe up and in, making sure the band/tubing is positioned to resist the entire motion. Hold this position for 10 seconds. Have your muscles resist the band/tubing as it slowly pulls your foot back to the starting position. Repeat 3 times. Complete this exercises 2 times per day.  Document Released: 01/22/2005 Document Revised: 04/16/2011 Document Reviewed: 05/06/2008 [facility] Patient Information 2014 [place], [place].

## 2022-01-24 NOTE — Progress Notes (Signed)
  Subjective:  Patient ID: Ronald [name]., male    DOB: 02-22-1975,  MRN: [id number]  Chief Complaint  Patient presents with   Plantar Fasciitis    left foot follow up/ pt was on light walking duty and work is not letting him - still having a lot of pain    46 y.o. male presents with the above complaint. History confirmed with patient.  He returns for follow-up.  His job will not allow him to work without restrictions, still very painful.  He has nearly completed the methylprednisolone and is beginning the meloxicam.  Objective:  Physical Exam: warm, good capillary refill, no trophic changes or ulcerative lesions, normal DP and PT pulses, and normal sensory exam. Left Foot: point tenderness over the heel pad  Assessment:   1. Plantar fasciitis, left      Plan:  Patient was evaluated and treated and all questions answered.  Discussed the etiology and treatment options for plantar fasciitis including stretching, formal physical therapy, supportive shoegears such as a running shoe or sneaker, pre fabricated orthoses, injection therapy, and oral medications. We also discussed the role of surgical treatment of this for patients who do not improve after exhausting non-surgical treatment options.    -Continue meloxicam daily -CAM boot dispensed he will be WBAT in this -Physical therapy order placed and he will schedule this at first available between [facility] [employer], [facility] in [place] -He has follow-up scheduled with Dr. Logan Bores   No follow-ups on file.

## 2022-02-13 ENCOUNTER — Encounter: Payer: Self-pay | Admitting: *Deleted

## 2022-02-13 ENCOUNTER — Ambulatory Visit (INDEPENDENT_AMBULATORY_CARE_PROVIDER_SITE_OTHER): Payer: BC Managed Care – PPO | Admitting: Podiatry

## 2022-02-13 DIAGNOSIS — M722 Plantar fascial fibromatosis: Secondary | ICD-10-CM

## 2022-02-13 MED ORDER — BETAMETHASONE SOD PHOS & ACET 6 (3-3) MG/ML IJ SUSP
3.0000 mg | Freq: Once | INTRAMUSCULAR | Status: AC
  Administered 2022-02-13: 3 mg via INTRA_ARTICULAR

## 2022-02-13 MED ORDER — MELOXICAM 15 MG PO TABS: 15.0000 mg | ORAL_TABLET | Freq: Every day | ORAL | 1 refills | Status: DC

## 2022-02-13 NOTE — Progress Notes (Signed)
   Chief Complaint  Patient presents with   Plantar Fasciitis    Left foot plantar fasciitis, patient is still having heel pain, rate 6 out of 10, TX: Cam Boot, Icing, Stretching,Mobic      Subjective: 47 y.o. male presenting today for follow-up evaluation of severe left heel pain this been going on about 3 months now.  Idiopathic onset.  Denies a history of injury.  He works 10-12-hour shifts on his feet all day.  Presenting for further treatment and evaluation   Past Medical History:  Diagnosis Date   Anxiety    Depression    Family history of adverse reaction to anesthesia    difficult to wake up - Mother   GERD (gastroesophageal reflux disease)    PTSD (post-traumatic stress disorder)    Past Surgical History:  Procedure Laterality Date   ELECTROCONVULSIVE THERAPY     FRACTURE SURGERY     wrist left   INSERTION OF MESH  08/25/2021   Procedure: INSERTION OF MESH;  Surgeon: Benjamine Sprague, DO;  Location: [facility] ORS;  Service: General;;   UMBILICAL HERNIA REPAIR  08/25/2021   Procedure: HERNIA REPAIR UMBILICAL ADULT;  Surgeon: Benjamine Sprague, DO;  Location: [facility] OR[facility];  Service: General;;   Allergies  Allergen Reactions   Buspar [Buspirone]      Objective: Physical Exam General: The patient is alert and oriented x3 in no acute distress.  Dermatology: Skin is warm, dry and supple bilateral lower extremities. Negative for open lesions or macerations bilateral.   Vascular: Dorsalis Pedis and Posterior Tibial pulses palpable bilateral.  Capillary fill time is immediate to all digits.  Neurological: Epicritic and protective threshold intact bilateral.   Musculoskeletal: Tenderness to palpation to the plantar aspect of the left heel along the plantar fascia. All other joints range of motion within normal limits bilateral. Strength 5/5 in all groups bilateral.   Radiographic exam LT foot 01/16/2022: Normal osseous mineralization. Joint spaces preserved. No fracture/dislocation/boney  destruction. No other soft tissue abnormalities or radiopaque foreign bodies.  Plantar heel spur noted  Assessment: 1. Plantar fasciitis left foot  Plan of Care:  1. Patient evaluated. Xrays reviewed.   2. Injection of 0.5cc Celestone soluspan injected into the left plantar fascia.  3.  Continue to meloxicam 15 mg daily 4.  Continue WBAT cam boot until his expected return to work date.  After that resume the OTC power step insoles 5.  Expected return to work date 02/27/2022 without restrictions 6.  Again today we discussed the possibility of surgery if symptoms not improve.  Will plan to reevaluate in 1 month  7.  Return to clinic 1 month  *Works at [employer] 10-12hrs/shift   Edrick Kins, DPM [facility]  Dr. Edrick Kins, DPM    [address], [place] [address]                Office [phone]  Fax [phone]

## 2022-02-23 ENCOUNTER — Ambulatory Visit: Payer: Self-pay | Admitting: Internal Medicine

## 2022-03-20 ENCOUNTER — Ambulatory Visit: Payer: BC Managed Care – PPO | Admitting: Podiatry

## 2022-07-04 ENCOUNTER — Ambulatory Visit
Admission: EM | Admit: 2022-07-04 | Discharge: 2022-07-04 | Disposition: A | Discharge status: Home / Self Care | Payer: BC Managed Care – PPO | Attending: Urgent Care | Admitting: Urgent Care

## 2022-07-04 DIAGNOSIS — J029 Acute pharyngitis, unspecified: Secondary | ICD-10-CM | POA: Diagnosis present

## 2022-07-04 DIAGNOSIS — R509 Fever, unspecified: Secondary | ICD-10-CM | POA: Diagnosis present

## 2022-07-04 DIAGNOSIS — R6889 Other general symptoms and signs: Secondary | ICD-10-CM | POA: Insufficient documentation

## 2022-07-04 DIAGNOSIS — F17210 Nicotine dependence, cigarettes, uncomplicated: Secondary | ICD-10-CM | POA: Diagnosis not present

## 2022-07-04 DIAGNOSIS — R519 Headache, unspecified: Secondary | ICD-10-CM | POA: Diagnosis present

## 2022-07-04 DIAGNOSIS — Z1152 Encounter for screening for COVID-19: Secondary | ICD-10-CM | POA: Diagnosis not present

## 2022-07-04 NOTE — ED Provider Notes (Signed)
[name]    CSN: [id number] Arrival date & time: 07/04/22  1119      History   Chief Complaint Chief Complaint  Patient presents with   Fever    Sore throat, fever, aching and a terrible headache - Entered by patient    HPI Caimen Blechinger. is a 47 y.o. male.    Fever   Patient presents to UC with c/o fever, sore throat, body aches, HA since yesterday. Endorses measured temp of 100.43F. He states sore throat is improved from yesterday - HA is worse.  Past Medical History:  Diagnosis Date   Anxiety    Depression    Family history of adverse reaction to anesthesia    difficult to wake up - Mother   GERD (gastroesophageal reflux disease)    PTSD (post-traumatic stress disorder)     There are no problems to display for this patient.   Past Surgical History:  Procedure Laterality Date   ELECTROCONVULSIVE THERAPY     FRACTURE SURGERY     wrist left   INSERTION OF MESH  08/25/2021   Procedure: INSERTION OF MESH;  Surgeon: Sung Amabile, DO;  Location: [facility] OR[facility];  Service: General;;   UMBILICAL HERNIA REPAIR  08/25/2021   Procedure: HERNIA REPAIR UMBILICAL ADULT;  Surgeon: Sung Amabile, DO;  Location: [facility] OR[facility];  Service: General;;       Home Medications    Prior to Admission medications   Medication Sig Start Date End Date Taking? Authorizing Provider  acetaminophen (TYLENOL) 325 MG tablet Take 2 tablets (650 mg total) by mouth every 8 (eight) hours as needed for up to 30 doses for mild pain, moderate pain, fever or headache. 08/25/21   Tonna Boehringer, Isami, DO  clonazePAM (KLONOPIN) 0.5 MG tablet Take 0.5 mg by mouth daily.    [provider]  HYDROcodone-acetaminophen (NORCO/VICODIN) 5-325 MG tablet Take 1 tablet by mouth every 8 (eight) hours as needed for up to 6 doses for severe pain (not relieved by other meds). 08/25/21   Tonna Boehringer, Isami, DO  ibuprofen (ADVIL) 800 MG tablet Take 1 tablet (800 mg total) by mouth every 8 (eight) hours as needed for up to  30 doses for mild pain or moderate pain. 08/25/21   Sung Amabile, DO  meloxicam (MOBIC) 15 MG tablet Take 1 tablet (15 mg total) by mouth daily. 02/13/22   Felecia Shelling, DPM  methylPREDNISolone (MEDROL DOSEPAK) 4 MG TBPK tablet 6 day dose pack - take as directed 01/16/22   Felecia Shelling, DPM    Family History History reviewed. No pertinent family history.  Social History Social History   Tobacco Use   Smoking status: Every Day    Packs/day: 1    Types: Cigarettes   Smokeless tobacco: Never  Vaping Use   Vaping Use: Every day  Substance Use Topics   Alcohol use: Yes    Comment: occasional   Drug use: No     Allergies   Buspar [buspirone]   Review of Systems Review of Systems  Constitutional:  Positive for fever.     Physical Exam Triage Vital Signs ED Triage Vitals  Enc Vitals Group     BP 07/04/22 1146 (!) 143/101     Pulse Rate 07/04/22 1133 93     Resp 07/04/22 1133 18     Temp 07/04/22 1133 97.8 F (36.6 C)     Temp Source 07/04/22 1133 Temporal     SpO2 07/04/22 1133 94 %  Weight --      Height --      Head Circumference --      Peak Flow --      Pain Score 07/04/22 1132 8     Pain Loc --      Pain Edu? --      Excl. in GC? --    No data found.  Updated Vital Signs BP (!) 143/101 (BP Location: Left Arm)   Pulse 93   Temp 97.8 F (36.6 C) (Temporal)   Resp 18   SpO2 94%   Visual Acuity Right Eye Distance:   Left Eye Distance:   Bilateral Distance:    Right Eye Near:   Left Eye Near:    Bilateral Near:     Physical Exam Vitals reviewed.  Constitutional:      Appearance: Normal appearance. He is ill-appearing.  HENT:     Mouth/Throat:     Mouth: Mucous membranes are moist.     Pharynx: Posterior oropharyngeal erythema present. No oropharyngeal exudate.  Cardiovascular:     Rate and Rhythm: Normal rate and regular rhythm.     Pulses: Normal pulses.     Heart sounds: Normal heart sounds.  Pulmonary:     Effort: Pulmonary effort  is normal.     Breath sounds: Normal breath sounds.  Skin:    General: Skin is warm and dry.  Neurological:     General: No focal deficit present.     Mental Status: He is alert and oriented to person, place, and time.  Psychiatric:        Mood and Affect: Mood normal.        Behavior: Behavior normal.     UC Treatments / Results  Labs (all labs ordered are listed, but only abnormal results are displayed) Labs Reviewed - No data to display  EKG   Radiology No results found.  Procedures Procedures (including critical care time)  Medications Ordered in UC Medications - No data to display  Initial Impression / Assessment and Plan / UC Course  I have reviewed the triage vital signs and the nursing notes.  Pertinent labs & imaging results that were available during my care of the patient were reviewed by me and considered in my medical decision making (see chart for details).   Wirt Hornaday. is a 47 y.o. male presenting with flu-like symptoms. Patient is afebrile without recent antipyretics, satting well on room air. Overall is ill appearing though non-toxic, well hydrated, without respiratory distress. Pulmonary exam is unremarkable.  Lungs CTAB without wheezing, rhonchi, rales. RRR. Mild pharyngeal erythema is present. No peri-tonsillar exudates.  Reviewed relevant chart history.   Patient symptoms are most consistent with an acute viral process. COVID swab result is pending. Recommending supportive care with use of OTC medications as needed.  Counseled patient on potential for adverse effects with medications prescribed/recommended today, ER and return-to-clinic precautions discussed, patient verbalized understanding and agreement with care plan.  Final Clinical Impressions(s) / UC Diagnoses   Final diagnoses:  None   Discharge Instructions   None    ED Prescriptions   None    PDMP not reviewed this encounter.   Charma Igo, [place] 07/04/22 1303

## 2022-07-04 NOTE — ED Triage Notes (Signed)
Patient presents to UC for fever, sore throat, body aches, and HA since yesterday. States his temp was 100.3. Taking tylenol and Theraflu.

## 2022-07-04 NOTE — Discharge Instructions (Signed)
You have been diagnosed with a viral upper respiratory infection based on your symptoms and exam. Viral illnesses cannot be treated with antibiotics - they are self limiting - and you should find your symptoms resolving within a few days. Get plenty of rest and non-caffeinated fluids. Watch for signs of dehydration including reduced urine output and dark colored urine.  We have performed a respiratory swab testing for COVID.  If the results of this testing are positive, someone will call you if you are eligible for any antiviral treatment.    We recommend you use over-the-counter medications for symptom control including acetaminophen (Tylenol), ibuprofen (Advil/Motrin) or naproxen (Aleve) for throat pain, fever, chills or body aches. You may combine use of acetaminophen and ibuprofen/naproxen if needed.  Some patients find an pain-relieving throat spray such as Chloraseptic to be effective.

## 2022-07-05 LAB — SARS CORONAVIRUS 2 (TAT 6-24 HRS): SARS Coronavirus 2: NEGATIVE

## 2022-11-30 ENCOUNTER — Emergency Department: Payer: Self-pay

## 2022-11-30 ENCOUNTER — Encounter: Payer: Self-pay | Admitting: Emergency Medicine

## 2022-11-30 ENCOUNTER — Emergency Department
Admission: EM | Admit: 2022-11-30 | Discharge: 2022-11-30 | Disposition: A | Discharge status: Home / Self Care | Payer: Self-pay | Attending: Emergency Medicine | Admitting: Emergency Medicine

## 2022-11-30 ENCOUNTER — Other Ambulatory Visit: Payer: Self-pay

## 2022-11-30 DIAGNOSIS — F419 Anxiety disorder, unspecified: Secondary | ICD-10-CM | POA: Insufficient documentation

## 2022-11-30 DIAGNOSIS — R079 Chest pain, unspecified: Secondary | ICD-10-CM | POA: Insufficient documentation

## 2022-11-30 LAB — BASIC METABOLIC PANEL
Anion gap: 10 (ref 5–15)
BUN: 10 mg/dL (ref 6–20)
CO2: 21 mmol/L — ABNORMAL LOW (ref 22–32)
Calcium: 8.7 mg/dL — ABNORMAL LOW (ref 8.9–10.3)
Chloride: 105 mmol/L (ref 98–111)
Creatinine, Ser: 0.85 mg/dL (ref 0.61–1.24)
GFR, Estimated: >60 mL/min (ref >60)
Glucose, Bld: 109 mg/dL — ABNORMAL HIGH (ref 70–99)
Potassium: 3.9 mmol/L (ref 3.5–5.1)
Sodium: 136 mmol/L (ref 135–145)

## 2022-11-30 LAB — CBC
HCT: 46.8 % (ref 39.0–52.0)
Hemoglobin: 16.8 g/dL (ref 13.0–17.0)
MCH: 31.3 pg (ref 26.0–34.0)
MCHC: 35.9 g/dL (ref 30.0–36.0)
MCV: 87.2 fL (ref 80.0–100.0)
Platelets: 389 10*3/uL (ref 150–400)
RBC: 5.37 MIL/uL (ref 4.22–5.81)
RDW: 12 % (ref 11.5–15.5)
WBC: 15.5 10*3/uL — ABNORMAL HIGH (ref 4.0–10.5)
nRBC: 0 % (ref 0.0–0.2)

## 2022-11-30 LAB — TROPONIN I (HIGH SENSITIVITY)
Troponin I (High Sensitivity): 3 ng/L (ref <18)
Troponin I (High Sensitivity): <2 ng/L (ref <18)

## 2022-11-30 MED ORDER — CHLORDIAZEPOXIDE HCL 25 MG PO CAPS: ORAL_CAPSULE | ORAL | 0 refills | Status: AC

## 2022-11-30 NOTE — ED Triage Notes (Addendum)
Pt reports he has hx of anxiety and panic attacks.  States she has had CP today.  Feels the anxiety attacks are getting more frequent.  "Something is going on"  Pt has left cp under his breast.  Denies any thoughts of SI/HI.  Feels like " I am going crazy"  Has not had steady medications in a while.

## 2022-11-30 NOTE — ED Provider Notes (Signed)
[facility] Provider Note   Event Date/Time   First MD Initiated Contact with Patient 11/30/22 [phone]     (approximate) History  Chest Pain and Anxiety  HPI Ronald [name]. is a 47 y.o. male with a stated past medical history of anxiety and chronic benzodiazepine use who presents complaining of increasing anxiety, panic attacks, and chest pain.  Patient states that these panic attacks are becoming more frequent as he has been unable to get his normal dosages of clonazepam.  Patient states that he used to be prescribed clonazepam however his psychiatrist decided to try bupropion and when he had an allergic reaction they refused to put him back on.  Since that time, patient states that he has been getting his clonazepam from another source.  Patient states that since this decrease in dosage, he has had increasing panic attacks with associated chest pain.  Patient denies any other exacerbating or relieving factors. ROS: Patient currently denies any vision changes, tinnitus, difficulty speaking, facial droop, sore throat, shortness of breath, abdominal pain, nausea/vomiting/diarrhea, dysuria, or weakness/numbness/paresthesias in any extremity   Physical Exam  Triage Vital Signs: ED Triage Vitals  Encounter Vitals Group     BP 11/30/22 0032 (!) 155/131     Systolic BP Percentile --      Diastolic BP Percentile --      Pulse Rate 11/30/22 0032 95     Resp 11/30/22 0032 18     Temp 11/30/22 0348 98.4 F (36.9 C)     Temp Source 11/30/22 0348 Oral     SpO2 11/30/22 0032 95 %     Weight 11/30/22 0722 179 lb 14.3 oz (81.6 kg)     Height 11/30/22 0722 5\' 8"  (1.727 m)     Head Circumference --      Peak Flow --      Pain Score 11/30/22 0030 5     Pain Loc --      Pain Education --      Exclude from Growth Chart --    Most recent vital signs: Vitals:   11/30/22 0348 11/30/22 0735  BP: (!) 151/108 (!) 148/90  Pulse: 95 89  Resp: 18 18  Temp: 98.4 F (36.9 C) 98 F  (36.7 C)  SpO2: 96% 97%   General: Awake, oriented x4. CV:  Good peripheral perfusion.  Resp:  Normal effort.  Abd:  No distention.  Other:  Middle-aged overweight Caucasian male resting comfortably in no acute distress ED Results / Procedures / Treatments  Labs (all labs ordered are listed, but only abnormal results are displayed) Labs Reviewed  BASIC METABOLIC PANEL - Abnormal; Notable for the following components:      Result Value   CO2 21 (*)    Glucose, Bld 109 (*)    Calcium 8.7 (*)    All other components within normal limits  CBC - Abnormal; Notable for the following components:   WBC 15.5 (*)    All other components within normal limits  TROPONIN I (HIGH SENSITIVITY)  TROPONIN I (HIGH SENSITIVITY)   EKG ED ECG REPORT I, Merwyn Katos, the attending physician, personally viewed and interpreted this ECG. Date: 11/30/2022 EKG Time: 0036 Rate: 103 Rhythm: Tachycardic sinus rhythm QRS Axis: normal Intervals: normal ST/T Wave abnormalities: normal Narrative Interpretation: Tachycardic sinus rhythm.  No evidence of acute ischemia RADIOLOGY ED MD interpretation: 2 view chest x-ray interpreted by me and shows linear fibrosis versus atelectasis in the mid and lower  lungs -Agree with radiology assessment Official radiology report(s): DG Chest 2 View  Result Date: 11/30/2022 CLINICAL DATA:  Chest pain.  History of anxiety and panic attacks. EXAM: CHEST - 2 VIEW COMPARISON:  01/03/2020 FINDINGS: Normal heart size and pulmonary vascularity. Linear fibrosis or atelectasis in the mid and lower lungs. No airspace disease or consolidation. No pleural effusions. No pneumothorax. Mediastinal contours appear intact. Old compression deformity of a midthoracic vertebra, unchanged. IMPRESSION: Linear fibrosis or atelectasis in the mid and lower lungs. No focal consolidation. Electronically Signed   By: Burman Nieves M.D.   On: 11/30/2022 01:05   PROCEDURES: Critical Care performed:  No Procedures MEDICATIONS ORDERED IN ED: Medications - No data to display IMPRESSION / MDM / ASSESSMENT AND PLAN / ED COURSE  I reviewed the triage vital signs and the nursing notes.                             The patient is on the cardiac monitor to evaluate for evidence of arrhythmia and/or significant heart rate changes. Patient's presentation is most consistent with acute presentation with potential threat to life or bodily function. This patient presents with symptoms consistent with acute anxiety reaction / panic attack. Low suspicion for acute cardiopulmonary process including ACS, PE, or thoracic aortic dissection. Denies any ingestions or any other medical complaints. No evidence of alcohol withdrawal symptoms. Presentation not consistent with overt toxidrome, ingestion given history & physical. Presentation not consistent with organic or medical emergency at this time.  Patient does show signs of early benzodiazepine withdrawal.  I counseled patient at length about the importance of cessation of benzodiazepines.  Patient has contracted for safety regarding a Librium taper in order to fully stop his benzodiazepine use as well as following up with [facility] for continued medications.  No acute indication for psychiatric consultation (without SI/HI, AH/VH). Cautious return precautions discussed with full understanding.  Plan: Rx Librium taper, Psych follow up PRN Dispo: Discharge   FINAL CLINICAL IMPRESSION(S) / ED DIAGNOSES   Final diagnoses:  Anxiety  Chest pain, unspecified type   Rx / DC Orders   ED Discharge Orders          Ordered    Consult to Transition of Care Team       Provider:  (Not yet assigned)   11/30/22 0826    chlordiazePOXIDE (LIBRIUM) 25 MG capsule  Multiple Frequencies        11/30/22 0826           Note:  This document was prepared using Dragon voice recognition software and may include unintentional dictation errors.   Merwyn Katos, MD 11/30/22  [phone]

## 2022-12-04 ENCOUNTER — Emergency Department: Payer: Medicaid Other

## 2022-12-04 ENCOUNTER — Emergency Department
Admission: EM | Admit: 2022-12-04 | Discharge: 2022-12-04 | Disposition: A | Discharge status: Home / Self Care | Payer: Medicaid Other | Attending: Emergency Medicine | Admitting: Emergency Medicine

## 2022-12-04 ENCOUNTER — Other Ambulatory Visit: Payer: Self-pay

## 2022-12-04 DIAGNOSIS — R079 Chest pain, unspecified: Secondary | ICD-10-CM | POA: Diagnosis present

## 2022-12-04 DIAGNOSIS — I10 Essential (primary) hypertension: Secondary | ICD-10-CM | POA: Insufficient documentation

## 2022-12-04 DIAGNOSIS — R000 Tachycardia, unspecified: Secondary | ICD-10-CM | POA: Diagnosis not present

## 2022-12-04 DIAGNOSIS — F419 Anxiety disorder, unspecified: Secondary | ICD-10-CM | POA: Insufficient documentation

## 2022-12-04 DIAGNOSIS — Z79899 Other long term (current) drug therapy: Secondary | ICD-10-CM | POA: Diagnosis not present

## 2022-12-04 LAB — CBC
HCT: 48.2 % (ref 39.0–52.0)
Hemoglobin: 16.4 g/dL (ref 13.0–17.0)
MCH: 29.9 pg (ref 26.0–34.0)
MCHC: 34 g/dL (ref 30.0–36.0)
MCV: 88 fL (ref 80.0–100.0)
Platelets: 368 10*3/uL (ref 150–400)
RBC: 5.48 MIL/uL (ref 4.22–5.81)
RDW: 12.2 % (ref 11.5–15.5)
WBC: 14.6 10*3/uL — ABNORMAL HIGH (ref 4.0–10.5)
nRBC: 0 % (ref 0.0–0.2)

## 2022-12-04 LAB — BASIC METABOLIC PANEL
Anion gap: 10 (ref 5–15)
BUN: 12 mg/dL (ref 6–20)
CO2: 23 mmol/L (ref 22–32)
Calcium: 9.2 mg/dL (ref 8.9–10.3)
Chloride: 105 mmol/L (ref 98–111)
Creatinine, Ser: 0.87 mg/dL (ref 0.61–1.24)
GFR, Estimated: >60 mL/min (ref >60)
Glucose, Bld: 132 mg/dL — ABNORMAL HIGH (ref 70–99)
Potassium: 3.8 mmol/L (ref 3.5–5.1)
Sodium: 138 mmol/L (ref 135–145)

## 2022-12-04 LAB — HEPATIC FUNCTION PANEL
ALT: 42 U/L (ref 0–44)
AST: 28 U/L (ref 15–41)
Albumin: 3.9 g/dL (ref 3.5–5.0)
Alkaline Phosphatase: 89 U/L (ref 38–126)
Bilirubin, Direct: <0.1 mg/dL (ref 0.0–0.2)
Total Bilirubin: 0.6 mg/dL (ref 0.3–1.2)
Total Protein: 7.1 g/dL (ref 6.5–8.1)

## 2022-12-04 LAB — URINALYSIS, ROUTINE W REFLEX MICROSCOPIC
Bilirubin Urine: NEGATIVE
Glucose, UA: NEGATIVE mg/dL
Hgb urine dipstick: NEGATIVE
Ketones, ur: NEGATIVE mg/dL
Leukocytes,Ua: NEGATIVE
Nitrite: NEGATIVE
Protein, ur: NEGATIVE mg/dL
Specific Gravity, Urine: 1.019 (ref 1.005–1.030)
pH: 5 (ref 5.0–8.0)

## 2022-12-04 LAB — LIPASE, BLOOD: Lipase: 36 U/L (ref 11–51)

## 2022-12-04 LAB — TROPONIN I (HIGH SENSITIVITY)
Troponin I (High Sensitivity): 3 ng/L (ref <18)
Troponin I (High Sensitivity): 3 ng/L (ref <18)

## 2022-12-04 MED ORDER — HYDROXYZINE PAMOATE 50 MG PO CAPS: 50.0000 mg | ORAL_CAPSULE | Freq: Three times a day (TID) | ORAL | 0 refills | Status: DC | PRN

## 2022-12-04 MED ORDER — AMLODIPINE BESYLATE 5 MG PO TABS: 5.0000 mg | ORAL_TABLET | Freq: Every day | ORAL | 2 refills | Status: DC

## 2022-12-04 NOTE — Discharge Instructions (Addendum)
Start amlodipine to help with your blood pressure and to the hydroxyzine as needed to help with anxiety.  Return to the ER if you develop worsening symptoms fevers or any other concerns otherwise you to follow-up with cardiology which I placed a referral as well as psychiatry.

## 2022-12-04 NOTE — ED Triage Notes (Signed)
Pt arrives via POV. Pt reports sudden onset of chest pain about 45 minutes ago. Pt also states he feels like his extremities are locking up. Pt reports elevated BP. Pt appears very anxious in triage, states  he can't calm down. Pt is AxOX4.

## 2022-12-04 NOTE — ED Provider Notes (Signed)
[facility] Provider Note    Event Date/Time   First MD Initiated Contact with Patient 12/04/22 1459     (approximate)   History   Chest Pain   HPI  Ronald [name]. is a 47 y.o. male with history of anxiety with chronic benzodiazepines who comes in with concern for chest pain.  Patient reports previously he was on clonazepam but had to stop taking it by his psychiatrist.  Patient was seen on 10/25 and given Librium.    He comes in today for chest pain that started about 45 minutes prior to arrival.  Patient reports that he has had significant issues with anxiety in the past.  He reports having ECT therapy previously and had been on clonazepam for years.  He states that a few months ago he was taken off the clonazepam and tried bupropion but he had a reaction and they would switch him back to the clonazepam.  He now he gets clonazepam from a friend.  He states that he felt very anxious today and felt he was having a lot of chest pain and he called EMS.  When he was in the EMS car he felt like even worse and he went to get out when he got here he was very upset.  He reports that he took 1 mg of his own Klonopin today.  He reports over the past month he is only taken about 4 mg total of Klonopin.  He also reports being very concerned about his blood pressure being elevated with these episodes.  But he states that even when he is not having an episode he reports elevated blood pressure and he wants to make sure that that is addressed today as well.  He denies any history of blood clots, swelling in his legs, recent long travel recent surgery.   Physical Exam   Triage Vital Signs: ED Triage Vitals  Encounter Vitals Group     BP 12/04/22 1420 (!) 181/122     Systolic BP Percentile --      Diastolic BP Percentile --      Pulse Rate 12/04/22 1420 (!) 115     Resp 12/04/22 1418 (!) 23     Temp 12/04/22 1418 98.1 F (36.7 C)     Temp src --      SpO2 12/04/22 1420  95 %     Weight --      Height --      Head Circumference --      Peak Flow --      Pain Score 12/04/22 1419 7     Pain Loc --      Pain Education --      Exclude from Growth Chart --     Most recent vital signs: Vitals:   12/04/22 1420 12/04/22 1425  BP: (!) 181/122 (!) 160/127  Pulse: (!) 115   Resp:    Temp:    SpO2: 95%      General: Awake, no distress.  Comfortably in bed CV:  Good peripheral perfusion.  Resp:  Normal effort.  Abd:  No distention.  Soft nontender Other:  No swelling in legs.  No calf tenderness   ED Results / Procedures / Treatments   Labs (all labs ordered are listed, but only abnormal results are displayed) Labs Reviewed  BASIC METABOLIC PANEL - Abnormal; Notable for the following components:      Result Value   Glucose, Bld 132 (*)  All other components within normal limits  CBC - Abnormal; Notable for the following components:   WBC 14.6 (*)    All other components within normal limits  TROPONIN I (HIGH SENSITIVITY)     EKG  My interpretation of EKG:  Sinus tachycardia rate of 119 without any ST elevation or T wave inversions, normal intervals  RADIOLOGY I have reviewed the xray personally and interpreted no evidence of any pneumonia PROCEDURES:  Critical Care performed: No  .1-3 Lead EKG Interpretation  Performed by: Concha Se, MD Authorized by: Concha Se, MD     Interpretation: normal     ECG rate:  90   ECG rate assessment: normal     Rhythm: sinus rhythm     Ectopy: none     Conduction: normal      MEDICATIONS ORDERED IN ED: Medications - No data to display   IMPRESSION / MDM / ASSESSMENT AND PLAN / ED COURSE  I reviewed the triage vital signs and the nursing notes.   Patient's presentation is most consistent with acute presentation with potential threat to life or bodily function.   Patient comes in initially tachycardic but after taking his own Klonopin his heart rate and blood pressure have  normalized and he is much more calm laying in bed.  Discussed with patient that his blood pressure is going to elevate when he is anxious. However I did review and he has had some elevated Blood pressures over the last 2 years even when not coming for anxiety. We discussed getting cardiac markers to evaluate for ACS, chest x-ray to evaluate for any pneumonia, pneumothorax.  Doubt PE given resolution of symptoms with Klonopin, no risk factors for such.  Initial troponin was negative.  BMP reassuring CBC shows slightly elevated white count  Lengthy conversation with patient about his anxiety.  I suspect he could have some mild withdrawal but he really has not been taking that much Klonopin over the past month so I suspect this is most likely just secondary to severe anxiety.  We discussed other measures to treat his anxiety including hydroxyzine.  We discussed needing to be on a long-term medication such as SSRI and the importance of following up with a psychiatrist.  I did consult TTS to see if they get him some resources from getting into [facility].  He denies any SI I do not see any indication for IVC at this time.  He does need close follow-up with psychiatry however.  He does report having some family history of heart disease so I will refer him to cardiology.  We will start him on amlodipine for his hypertension  The patient is on the cardiac monitor to evaluate for evidence of arrhythmia and/or significant heart rate changes.      FINAL CLINICAL IMPRESSION(S) / ED DIAGNOSES   Final diagnoses:  Chest pain, unspecified type  Anxiety  Hypertension, unspecified type     Rx / DC Orders   ED Discharge Orders          Ordered    Ambulatory referral to Cardiology        12/04/22 1747    amLODipine (NORVASC) 5 MG tablet  Daily        12/04/22 1748    hydrOXYzine (VISTARIL) 50 MG capsule  3 times daily PRN        12/04/22 1748             Note:  This document was prepared using Dragon  voice recognition software and may include unintentional dictation errors.   Concha Se, MD 12/04/22 [phone]

## 2022-12-04 NOTE — ED Notes (Signed)
Patient to restroom at this time. Patient able to ambulate independently with steady gait. Patient placed back on monitor with call bell in reach.

## 2022-12-04 NOTE — BH Assessment (Signed)
Writer provided patient with outpatient resources.

## 2022-12-12 ENCOUNTER — Ambulatory Visit: Payer: Self-pay | Admitting: Cardiology

## 2022-12-18 ENCOUNTER — Other Ambulatory Visit: Payer: Self-pay

## 2022-12-18 MED ORDER — HYDROXYZINE HCL 50 MG PO TABS
50.0000 mg | ORAL_TABLET | Freq: Three times a day (TID) | ORAL | 0 refills | Status: DC
  Filled 2022-12-18: qty 90, 30d supply, fill #0

## 2022-12-19 ENCOUNTER — Other Ambulatory Visit: Payer: Self-pay

## 2022-12-26 ENCOUNTER — Encounter: Payer: Self-pay | Admitting: Psychiatry

## 2022-12-26 ENCOUNTER — Other Ambulatory Visit: Payer: Self-pay

## 2022-12-26 ENCOUNTER — Emergency Department
Admission: EM | Admit: 2022-12-26 | Discharge: 2022-12-26 | Disposition: A | Discharge status: Other Acute Inpatient Hospital | Payer: 59 | Attending: Emergency Medicine | Admitting: Emergency Medicine

## 2022-12-26 ENCOUNTER — Inpatient Hospital Stay
Admission: AD | Admit: 2022-12-26 | Discharge: 2023-01-07 | DRG: 885 | Disposition: A | Discharge status: Home / Self Care | Payer: 59 | Source: Intra-hospital | Attending: Psychiatry | Admitting: Psychiatry

## 2022-12-26 DIAGNOSIS — F17210 Nicotine dependence, cigarettes, uncomplicated: Secondary | ICD-10-CM | POA: Diagnosis present

## 2022-12-26 DIAGNOSIS — F332 Major depressive disorder, recurrent severe without psychotic features: Principal | ICD-10-CM | POA: Diagnosis present

## 2022-12-26 DIAGNOSIS — F13939 Sedative, hypnotic or anxiolytic use, unspecified with withdrawal, unspecified: Secondary | ICD-10-CM

## 2022-12-26 DIAGNOSIS — R4587 Impulsiveness: Secondary | ICD-10-CM | POA: Diagnosis present

## 2022-12-26 DIAGNOSIS — F13230 Sedative, hypnotic or anxiolytic dependence with withdrawal, uncomplicated: Secondary | ICD-10-CM | POA: Insufficient documentation

## 2022-12-26 DIAGNOSIS — R9431 Abnormal electrocardiogram [ECG] [EKG]: Secondary | ICD-10-CM

## 2022-12-26 DIAGNOSIS — F4310 Post-traumatic stress disorder, unspecified: Secondary | ICD-10-CM | POA: Diagnosis present

## 2022-12-26 DIAGNOSIS — F410 Panic disorder [episodic paroxysmal anxiety] without agoraphobia: Secondary | ICD-10-CM | POA: Diagnosis present

## 2022-12-26 DIAGNOSIS — F411 Generalized anxiety disorder: Secondary | ICD-10-CM | POA: Diagnosis present

## 2022-12-26 DIAGNOSIS — F419 Anxiety disorder, unspecified: Secondary | ICD-10-CM

## 2022-12-26 DIAGNOSIS — Z5986 Financial insecurity: Secondary | ICD-10-CM

## 2022-12-26 DIAGNOSIS — K219 Gastro-esophageal reflux disease without esophagitis: Secondary | ICD-10-CM | POA: Diagnosis present

## 2022-12-26 DIAGNOSIS — R45850 Homicidal ideations: Secondary | ICD-10-CM | POA: Diagnosis present

## 2022-12-26 DIAGNOSIS — F17290 Nicotine dependence, other tobacco product, uncomplicated: Secondary | ICD-10-CM | POA: Diagnosis present

## 2022-12-26 DIAGNOSIS — F13239 Sedative, hypnotic or anxiolytic dependence with withdrawal, unspecified: Secondary | ICD-10-CM | POA: Diagnosis present

## 2022-12-26 DIAGNOSIS — R030 Elevated blood-pressure reading, without diagnosis of hypertension: Secondary | ICD-10-CM | POA: Diagnosis present

## 2022-12-26 DIAGNOSIS — G4700 Insomnia, unspecified: Secondary | ICD-10-CM | POA: Diagnosis present

## 2022-12-26 DIAGNOSIS — Z888 Allergy status to other drugs, medicaments and biological substances status: Secondary | ICD-10-CM

## 2022-12-26 DIAGNOSIS — R45851 Suicidal ideations: Secondary | ICD-10-CM

## 2022-12-26 DIAGNOSIS — I10 Essential (primary) hypertension: Secondary | ICD-10-CM | POA: Diagnosis present

## 2022-12-26 DIAGNOSIS — Z79899 Other long term (current) drug therapy: Secondary | ICD-10-CM

## 2022-12-26 DIAGNOSIS — F13930 Sedative, hypnotic or anxiolytic use, unspecified with withdrawal, uncomplicated: Secondary | ICD-10-CM | POA: Diagnosis not present

## 2022-12-26 LAB — COMPREHENSIVE METABOLIC PANEL
ALT: 35 U/L (ref 0–44)
AST: 21 U/L (ref 15–41)
Albumin: 4.7 g/dL (ref 3.5–5.0)
Alkaline Phosphatase: 75 U/L (ref 38–126)
Anion gap: 8 (ref 5–15)
BUN: 12 mg/dL (ref 6–20)
CO2: 19 mmol/L — ABNORMAL LOW (ref 22–32)
Calcium: 8.7 mg/dL — ABNORMAL LOW (ref 8.9–10.3)
Chloride: 107 mmol/L (ref 98–111)
Creatinine, Ser: 0.84 mg/dL (ref 0.61–1.24)
GFR, Estimated: >60 mL/min (ref >60)
Glucose, Bld: 161 mg/dL — ABNORMAL HIGH (ref 70–99)
Potassium: 3.3 mmol/L — ABNORMAL LOW (ref 3.5–5.1)
Sodium: 134 mmol/L — ABNORMAL LOW (ref 135–145)
Total Bilirubin: 0.8 mg/dL (ref <1.2)
Total Protein: 8 g/dL (ref 6.5–8.1)

## 2022-12-26 LAB — URINE DRUG SCREEN, QUALITATIVE (ARMC ONLY)
Amphetamines, Ur Screen: NOT DETECTED
Barbiturates, Ur Screen: NOT DETECTED
Benzodiazepine, Ur Scrn: POSITIVE — AB
Cannabinoid 50 Ng, Ur ~~LOC~~: NOT DETECTED
Cocaine Metabolite,Ur ~~LOC~~: NOT DETECTED
MDMA (Ecstasy)Ur Screen: NOT DETECTED
Methadone Scn, Ur: NOT DETECTED
Opiate, Ur Screen: NOT DETECTED
Phencyclidine (PCP) Ur S: NOT DETECTED
Tricyclic, Ur Screen: POSITIVE — AB

## 2022-12-26 LAB — ACETAMINOPHEN LEVEL: Acetaminophen (Tylenol), Serum: <10 ug/mL — ABNORMAL LOW (ref 10–30)

## 2022-12-26 LAB — CBC
HCT: 47 % (ref 39.0–52.0)
Hemoglobin: 16.1 g/dL (ref 13.0–17.0)
MCH: 30.2 pg (ref 26.0–34.0)
MCHC: 34.3 g/dL (ref 30.0–36.0)
MCV: 88.2 fL (ref 80.0–100.0)
Platelets: 387 10*3/uL (ref 150–400)
RBC: 5.33 MIL/uL (ref 4.22–5.81)
RDW: 12.3 % (ref 11.5–15.5)
WBC: 15.3 10*3/uL — ABNORMAL HIGH (ref 4.0–10.5)
nRBC: 0 % (ref 0.0–0.2)

## 2022-12-26 LAB — ETHANOL: Alcohol, Ethyl (B): <10 mg/dL (ref <10)

## 2022-12-26 LAB — SALICYLATE LEVEL: Salicylate Lvl: <7 mg/dL — ABNORMAL LOW (ref 7.0–30.0)

## 2022-12-26 MED ORDER — HALOPERIDOL 5 MG PO TABS
5.0000 mg | ORAL_TABLET | Freq: Three times a day (TID) | ORAL | Status: DC | PRN
  Administered 2022-12-27 – 2023-01-01 (×4): 5 mg via ORAL
  Filled 2022-12-26 (×4): qty 1

## 2022-12-26 MED ORDER — HYDROXYZINE HCL 25 MG PO TABS
50.0000 mg | ORAL_TABLET | Freq: Four times a day (QID) | ORAL | Status: DC | PRN
  Administered 2022-12-26: 50 mg via ORAL
  Filled 2022-12-26: qty 2

## 2022-12-26 MED ORDER — TRAZODONE HCL 50 MG PO TABS
50.0000 mg | ORAL_TABLET | Freq: Every evening | ORAL | Status: DC | PRN
  Administered 2022-12-30 – 2023-01-06 (×8): 50 mg via ORAL
  Filled 2022-12-26 (×9): qty 1

## 2022-12-26 MED ORDER — LORAZEPAM 1 MG PO TABS: 1.0000 mg | ORAL_TABLET | Freq: Four times a day (QID) | ORAL | Status: DC | PRN

## 2022-12-26 MED ORDER — LORAZEPAM 1 MG PO TABS
1.0000 mg | ORAL_TABLET | Freq: Once | ORAL | Status: AC
  Administered 2022-12-26: 1 mg via ORAL
  Filled 2022-12-26: qty 1

## 2022-12-26 MED ORDER — LOPERAMIDE HCL 2 MG PO CAPS: 2.0000 mg | ORAL_CAPSULE | ORAL | Status: DC | PRN

## 2022-12-26 MED ORDER — LOPERAMIDE HCL 2 MG PO CAPS: 2.0000 mg | ORAL_CAPSULE | ORAL | Status: AC | PRN

## 2022-12-26 MED ORDER — ONDANSETRON 4 MG PO TBDP: 4.0000 mg | ORAL_TABLET | Freq: Four times a day (QID) | ORAL | Status: DC | PRN

## 2022-12-26 MED ORDER — ACETAMINOPHEN 325 MG PO TABS
650.0000 mg | ORAL_TABLET | Freq: Four times a day (QID) | ORAL | Status: DC | PRN
  Administered 2023-01-06: 650 mg via ORAL
  Filled 2022-12-26: qty 2

## 2022-12-26 MED ORDER — ONDANSETRON 4 MG PO TBDP: 4.0000 mg | ORAL_TABLET | Freq: Four times a day (QID) | ORAL | Status: AC | PRN

## 2022-12-26 MED ORDER — DIPHENHYDRAMINE HCL 25 MG PO CAPS
50.0000 mg | ORAL_CAPSULE | Freq: Three times a day (TID) | ORAL | Status: DC | PRN
  Administered 2022-12-27 – 2023-01-01 (×4): 50 mg via ORAL
  Filled 2022-12-26 (×4): qty 2

## 2022-12-26 MED ORDER — HYDROXYZINE HCL 50 MG PO TABS
50.0000 mg | ORAL_TABLET | Freq: Four times a day (QID) | ORAL | Status: DC | PRN
  Administered 2022-12-27: 50 mg via ORAL
  Filled 2022-12-26: qty 1

## 2022-12-26 MED ORDER — ALUM & MAG HYDROXIDE-SIMETH 200-200-20 MG/5ML PO SUSP
30.0000 mL | ORAL | Status: DC | PRN
Start: 2022-12-26 — End: 2023-01-07
  Administered 2022-12-29: 30 mL via ORAL
  Filled 2022-12-26: qty 30

## 2022-12-26 MED ORDER — MAGNESIUM HYDROXIDE 400 MG/5ML PO SUSP
30.0000 mL | Freq: Every day | ORAL | Status: DC | PRN
Start: 2022-12-26 — End: 2023-01-07

## 2022-12-26 NOTE — BH Assessment (Signed)
Comprehensive Clinical Assessment (CCA) Note  12/26/2022 Ronald [name] [id number]  [name]., 47 year old male who presents to [facility] ED voluntarily for treatment. Per triage note, Pt to ED VOL, Pt reports he was just seen at [facility] psych 2 weeks ago. Pt reports being worried over everything recently and "feels like he is going to lose control" pt reports feeling anxious over having blood pressure taken and anxious over leaving his house. Pt calm and cooperative. Pt states tonight he had a rush of thoughts where he was scared, he was going to hurt himself or others.   During TTS assessment pt presents alert and oriented x 4, restless but cooperative, and mood-congruent with affect. The pt does not appear to be responding to internal or external stimuli. Neither is the pt presenting with any delusional thinking. Pt verified the information provided to triage RN.   Pt identifies his main complaint to be that he has this "overwhelming feeling like he is about to lose his mind." Patient states his symptoms have worsened in the past 4 weeks and last night he could not take it anymore. Patient reports he has been taking Klonopin for the past 9 years. For first 5 years, patient was prescribed this medication by his provider; however, he wanted to try Buspar but had an allergic reaction to it so he discontinued use. The outpatient provider would not put him back on the Klonopin, so for the past 4 years, patient has been purchasing it off the street. Patient reports he last used it on Tuesday of last week. Since that that time, patient has been taking Xanax and Valium which was prescribed to him. Patient states his body feels tense. Patient presents tremulous, anxious, and tearful. Patient has a hx of anxiety and depression. He reports prior ECT with Dr. Toni Amend was extremely helpful. Patient is unemployed and lives with his mom, daughter, daughter's boyfriend, and his granddaughter. Patient was recently  discharged from [facility] about 2 weeks ago. Patient follow up with [facility] and has a scheduled Zoom meeting tomm at 2:00pm. Pt denies current SI/HI/AH/VH. Pt provided his mom, [name] as a collateral contact.   Per Annice Pih, NP: Pt gives verbal consent to speak with mother, [name], [phone]. Spoke w/ [name] who reports pt was in significant distress yesterday and told her as well as police who came to pick him up that he was having thoughts about wanting to hurt himself and others. States she has significant concerns to his safety. She believes he needs to be psychiatrically admitted and had been considering pursuing IVC if needed. Reports pt has distance history of 7 ECT treatments, had been recommended for 13 at the time. She states "he is a harm right now". She states pt has also been more agitated recently.   Per Annice Pih, NP, pt is recommended for inpatient psychiatric admission.  Chief Complaint:  Chief Complaint  Patient presents with   Psychiatric Evaluation   Visit Diagnosis: Benzodiazepine withdrawal    CCA Screening, Triage and Referral (STR)  Patient Reported Information How did you hear about [place]? Self  Referral name: No data recorded Referral phone number: No data recorded  Whom do you see for routine medical problems? No data recorded Practice/Facility Name: No data recorded Practice/Facility Phone Number: No data recorded Name of Contact: No data recorded Contact Number: No data recorded Contact Fax Number: No data recorded Prescriber Name: No data recorded Prescriber Address (if known): No data recorded  What Is the Reason  for Your Visit/Call Today? Severe anxiety and depression  How Long Has This Been Causing You Problems? 1-6 months  What Do You Feel Would Help You the Most Today? Treatment for Depression or other mood problem; Medication(s)   Have You Recently Been in Any Inpatient Treatment (Hospital/Detox/Crisis Center/28-Day Program)? No data recorded Name/Location  of Program/Hospital:No data recorded How Long Were You There? No data recorded When Were You Discharged? No data recorded  Have You Ever Received Services From [facility] Before? No data recorded Who Do You See at [facility]? No data recorded  Have You Recently Had Any Thoughts About Hurting Yourself? Yes  Are You Planning to Commit Suicide/Harm Yourself At This time? No   Have you Recently Had Thoughts About Hurting Someone [name]? No  Explanation: No data recorded  Have You Used Any Alcohol or Drugs in the Past 24 Hours? No  How Long Ago Did You Use Drugs or Alcohol? No data recorded What Did You Use and How Much? No data recorded  Do You Currently Have a Therapist/Psychiatrist? Yes  Name of Therapist/Psychiatrist: [facility]   Have You Been Recently Discharged From Any Office Practice or Programs? Yes  Explanation of Discharge From Practice/Program: [facility]     CCA Screening Triage Referral Assessment Type of Contact: Face-to-Face  Is this Initial or Reassessment? No data recorded Date Telepsych consult ordered in [facility]:  No data recorded Time Telepsych consult ordered in [facility]:  No data recorded  Patient Reported Information Reviewed? No data recorded Patient Left Without Being Seen? No data recorded Reason for Not Completing Assessment: No data recorded  Collateral Involvement: Mom- [name]   Does Patient Have a Automotive engineer Guardian? No data recorded Name and Contact of Legal Guardian: No data recorded If Minor and Not Living with Parent(s), Who has Custody? No data recorded Is CPS involved or ever been involved? Never  Is APS involved or ever been involved? Never   Patient Determined To Be At Risk for Harm To Self or Others Based on Review of Patient Reported Information or Presenting Complaint? No  Method: No Plan  Availability of Means: No access or NA  Intent: Vague intent or NA  Notification Required: No need or identified  person  Additional Information for Danger to Others Potential: No data recorded Additional Comments for Danger to Others Potential: No data recorded Are There Guns or Other Weapons in Your Home? Yes  Types of Guns/Weapons: No data recorded Are These Weapons Safely Secured?                            Yes  Who Could Verify You Are Able To Have These Secured: No data recorded Do You Have any Outstanding Charges, Pending Court Dates, Parole/Probation? No data recorded Contacted To Inform of Risk of Harm To Self or Others: No data recorded  Location of Assessment: [facility] ED   Does Patient Present under Involuntary Commitment? No  IVC Papers Initial File Date: No data recorded  [place] of Residence: [place]   Patient Currently Receiving the Following Services: Individual Therapy; Medication Management   Determination of Need: Emergent (2 hours)   Options For Referral: ED Visit; Inpatient Hospitalization; Medication Management; Outpatient Therapy     CCA Biopsychosocial Intake/Chief Complaint:  No data recorded Current Symptoms/Problems: No data recorded  Patient Reported Schizophrenia/Schizoaffective Diagnosis in Past: No   Strengths: Patient is able to communicate and verbalize his needs.  Preferences: No data  recorded Abilities: No data recorded  Type of Services Patient Feels are Needed: No data recorded  Initial Clinical Notes/Concerns: No data recorded  Mental Health Symptoms Depression:   Change in energy/activity; Difficulty Concentrating; Tearfulness; Irritability   Duration of Depressive symptoms:  Greater than two weeks   Mania:   N/A   Anxiety:    Difficulty concentrating; Irritability; Restlessness; Tension; Worrying   Psychosis:   None   Duration of Psychotic symptoms: No data recorded  Trauma:   N/A   Obsessions:   N/A   Compulsions:   N/A   Inattention:   N/A   Hyperactivity/Impulsivity:   N/A   Oppositional/Defiant Behaviors:    N/A   Emotional Irregularity:   N/A   Other Mood/Personality Symptoms:  No data recorded   Mental Status Exam Appearance and self-care  Stature:   Average   Weight:   Average weight   Clothing:   Casual   Grooming:   Normal   Cosmetic use:   None   Posture/gait:   Tense   Motor activity:   Tremor   Sensorium  Attention:   Normal   Concentration:   Anxiety interferes; Preoccupied   Orientation:   X5   Recall/memory:   Normal   Affect and Mood  Affect:   Anxious; Depressed; Flat   Mood:   Anxious; Depressed   Relating  Eye contact:   Normal   Facial expression:   Anxious; Tense   Attitude toward examiner:   Cooperative   Thought and Language  Speech flow:  Clear and Coherent; Pressured   Thought content:   Appropriate to Mood and Circumstances   Preoccupation:   None   Hallucinations:   None   Organization:  No data recorded  [employer] of Knowledge:   Average   Intelligence:   Average   Abstraction:   Normal   Judgement:   Impaired   Reality Testing:   Realistic   Insight:   Fair   Decision Making:  No data recorded  Social Functioning  Social Maturity:   Isolates   Social Judgement:   Normal   Stress  Stressors:   Illness; Transitions   Coping Ability:   Human resources officer Deficits:   None   Supports:   Family     Religion:    Leisure/Recreation:    Exercise/Diet: Exercise/Diet Do You Follow a Special Diet?: No Do You Have Any Trouble Sleeping?: No   CCA Employment/Education Employment/Work Situation: Employment / Work Situation Employment Situation: Unemployed  Education:     CCA Family/Childhood History Family and Relationship History: Family history Marital status: Single Does patient have children?: Yes  Childhood History:     Child/Adolescent Assessment:     CCA Substance Use Alcohol/Drug Use: Alcohol / Drug Use Pain Medications: SEE  PTA Prescriptions: SEE PTA Over the Counter: SEE PTA History of alcohol / drug use?: No history of alcohol / drug abuse                         ASAM's:  Six Dimensions of Multidimensional Assessment  Dimension 1:  Acute Intoxication and/or Withdrawal Potential:      Dimension 2:  Biomedical Conditions and Complications:      Dimension 3:  Emotional, Behavioral, or Cognitive Conditions and Complications:     Dimension 4:  Readiness to Change:     Dimension 5:  Relapse, Continued use, or Continued Problem Potential:  Dimension 6:  Recovery/Living Environment:     ASAM Severity Score:    ASAM Recommended Level of Treatment:     Substance use Disorder (SUD)    Recommendations for Services/Supports/Treatments:    DSM5 Diagnoses: Patient Active Problem List   Diagnosis Date Noted   Anxiety 12/26/2022   Benzodiazepine withdrawal (HCC) 12/26/2022    Patient Centered Plan: Patient is on the following Treatment Plan(s):  Anxiety and Depression   Referrals to Alternative Service(s): Referred to Alternative Service(s):   Place:   Date:   Time:    Referred to Alternative Service(s):   Place:   Date:   Time:    Referred to Alternative Service(s):   Place:   Date:   Time:    Referred to Alternative Service(s):   Place:   Date:   Time:      @BHCOLLABOFCARE @  Jacey Pelc R Theatre manager, Counselor, LCAS-A

## 2022-12-26 NOTE — ED Notes (Signed)
Report called to izzy rn bmu nurse.

## 2022-12-26 NOTE — ED Notes (Signed)
Patient asking to watch TV, no signs of distress.

## 2022-12-26 NOTE — ED Notes (Signed)
vol/psych consult ordered/pending.. 

## 2022-12-26 NOTE — BH Assessment (Signed)
Patient is to be admitted to [facility]U tonight 12/26/22 after 8:30pm by Dr.  Marlou Porch .  Attending Physician will be Dr. Marlou Porch.   Patient has been assigned to room 311, by [facility] Charge Nurse Minerva Areola.    ER staff is aware of the admission: Lisa,ER Secretary   Dr. Anner Crete, ER MD  Toniann Fail, Patient's Nurse  Rob, Patient Access.

## 2022-12-26 NOTE — Consult Note (Signed)
[facility] Face-to-Face Psychiatry Consult   Reason for Consult:  Psych consult Referring Physician:  Dr. Delton Prairie Patient Identification: Ronald [name]. MRN:  [id number] Principal Diagnosis: Benzodiazepine withdrawal (HCC) Diagnosis:  Principal Problem:   Benzodiazepine withdrawal (HCC) Active Problems:   Anxiety  Total Time spent with patient: 1 hour  Subjective:  "Something out of the ordinary for me. All of a sudden I had thoughts of not of hurting myself or others but fixing to lose my mind."  HPI:   Pt chart reviewed and assessed face to face. On assessment pt reports he presented to the ED because "Something out of the ordinary for me. All of a sudden I had thoughts of not of hurting myself or others but fixing to lose my mind." He reports he became overwhelmed yesterday and knocked on his mother's door asking her to call 911. He admits making statement that he was concerned of hurting himself or others. Reports he is so anxious, he is having difficulty completing activities such as driving, leaving the home, going to doctor's appointments.   Pt reports he has been taking klonopin for 9 years. The first 5 years pt was prescribed klonopin. He reports he did not want to continue on klonopin and attempted to transition to buspar but had an allergic rxn and his outpatient provider would not put him back on klonopin. He has been using klonopin 0.5mg  BID for the past 4 years unprescribed. He reports last use of klonopin was on Tuesday of last week. However, in the interim he has been using valium and xanax which were prescribed to him.   Pt endorses significant history of anxiety and depression. He has had ECT previously with Dr. Toni Amend.   Pt reports history of cutting, reports he identifies cutting as suicide attempts. Healed scarring noted on pt's left forearm.   Pt reports he is unemployed.   Pt was recently psychiatrically hospitalized at [facility]. Followed up with [facility] at discharge. Had  first appointment for psychiatric services and is supposed to start 1st virtual group therapy tomorrow.   Pt gives verbal consent to speak with mother, [name], [phone]. Spoke w/ [name] who reports pt was in significant distress yesterday and told her as well as police who came to pick him up that he was having thoughts about wanting to hurt himself and others. States she has significant concerns to his safety. She believes he needs to be psychiatrically admitted and had been considering pursuing IVC if needed. Reports pt has distance history of 7 ECT treatments, had been recommended for 13 at the time. She states "he is a harm right now". She states pt has also been more agitated recently.   Past Psychiatric History: unspecific anxiety disorder, mdd, gad  Risk to Self: Denies suicidal ideations although collateral expresses safety concerns Risk to Others: Denies homicidal ideations Prior Inpatient Therapy: Yes Prior Outpatient Therapy: Yes  Past Medical History:  Past Medical History:  Diagnosis Date   Anxiety    Depression    Family history of adverse reaction to anesthesia    difficult to wake up - Mother   GERD (gastroesophageal reflux disease)    PTSD (post-traumatic stress disorder)     Past Surgical History:  Procedure Laterality Date   ELECTROCONVULSIVE THERAPY     FRACTURE SURGERY     wrist left   INSERTION OF MESH  08/25/2021   Procedure: INSERTION OF MESH;  Surgeon: Sung Amabile, DO;  Location: [facility] ORS;  Service: General;;  UMBILICAL HERNIA REPAIR  08/25/2021   Procedure: HERNIA REPAIR UMBILICAL ADULT;  Surgeon: Sung Amabile, DO;  Location: [facility] ORS;  Service: General;;   Family History: History reviewed. No pertinent family history. Family Psychiatric  History: None reported Social History:  Social History   Substance and Sexual Activity  Alcohol Use Yes   Comment: occasional     Social History   Substance and Sexual Activity  Drug Use No    Social History    Socioeconomic History   Marital status: Single    Spouse name: Not on file   Number of children: Not on file   Years of education: Not on file   Highest education level: Not on file  Occupational History   Not on file  Tobacco Use   Smoking status: Every Day    Current packs/day: 1.00    Types: Cigarettes   Smokeless tobacco: Never  Vaping Use   Vaping status: Every Day  Substance and Sexual Activity   Alcohol use: Yes    Comment: occasional   Drug use: No   Sexual activity: Not on file  Other Topics Concern   Not on file  Social History Narrative   Not on file   Social Determinants of Health   Financial Resource Strain: Medium Risk (12/10/2022)   Received from [facility]   Overall Financial Resource Strain (CARDIA)    Difficulty of Paying Living Expenses: Somewhat hard  Food Insecurity: No Food Insecurity (12/10/2022)   Received from [facility]   Hunger Vital Sign    Worried About Running Out of Food in the Last Year: Never true    Ran Out of Food in the Last Year: Never true  Transportation Needs: No Transportation Needs (12/10/2022)   Received from [facility]   PRAPARE - Transportation    Lack of Transportation (Medical): No    Lack of Transportation (Non-Medical): No  Physical Activity: Not on file  Stress: Not on file  Social Connections: Not on file   Additional Social History:    Allergies:   Allergies  Allergen Reactions   Buspar [Buspirone]     Labs:  Results for orders placed or performed during the hospital encounter of 12/26/22 (from the past 48 hour(s))  Comprehensive metabolic panel     Status: Abnormal   Collection Time: 12/26/22  1:42 AM  Result Value Ref Range   Sodium 134 (L) 135 - 145 mmol/L   Potassium 3.3 (L) 3.5 - 5.1 mmol/L   Chloride 107 98 - 111 mmol/L   CO2 19 (L) 22 - 32 mmol/L   Glucose, Bld 161 (H) 70 - 99 mg/dL    Comment: Glucose reference range applies only to samples taken after fasting for at least 8  hours.   BUN 12 6 - 20 mg/dL   Creatinine, Ser 2.95 0.61 - 1.24 mg/dL   Calcium 8.7 (L) 8.9 - 10.3 mg/dL   Total Protein 8.0 6.5 - 8.1 g/dL   Albumin 4.7 3.5 - 5.0 g/dL   AST 21 15 - 41 U/L   ALT 35 0 - 44 U/L   Alkaline Phosphatase 75 38 - 126 U/L   Total Bilirubin 0.8 <1.2 mg/dL   GFR, Estimated >28 >41 mL/min    Comment: (NOTE) Calculated using the CKD-EPI Creatinine Equation (2021)    Anion gap 8 5 - 15    Comment: Performed at [facility], [address] [place], [place] [address]  Ethanol  Status: None   Collection Time: 12/26/22  1:42 AM  Result Value Ref Range   Alcohol, Ethyl (B) <10 <10 mg/dL    Comment: (NOTE) Lowest detectable limit for serum alcohol is 10 mg/dL.  For medical purposes only. Performed at [facility], [address] [place], [place] [address]   Salicylate level     Status: Abnormal   Collection Time: 12/26/22  1:42 AM  Result Value Ref Range   Salicylate Lvl <7.0 (L) 7.0 - 30.0 mg/dL    Comment: Performed at [facility], [address] [place], [place] [address]  Acetaminophen level     Status: Abnormal   Collection Time: 12/26/22  1:42 AM  Result Value Ref Range   Acetaminophen (Tylenol), Serum <10 (L) 10 - 30 ug/mL    Comment: (NOTE) Therapeutic concentrations vary significantly. A range of 10-30 ug/mL  may be an effective concentration for many patients. However, some  are best treated at concentrations outside of this range. Acetaminophen concentrations >150 ug/mL at 4 hours after ingestion  and >50 ug/mL at 12 hours after ingestion are often associated with  toxic reactions.  Performed at [facility], [address] [place], [place] [address]   cbc     Status: Abnormal   Collection Time: 12/26/22  1:42 AM  Result Value Ref Range   WBC 15.3 (H) 4.0 - 10.5 K/uL   RBC 5.33 4.22 - 5.81 MIL/uL   Hemoglobin 16.1 13.0 - 17.0 g/dL   HCT 91.4 78.2 - 95.6 %   MCV 88.2 80.0 - 100.0 fL   MCH  30.2 26.0 - 34.0 pg   MCHC 34.3 30.0 - 36.0 g/dL   RDW 21.3 08.6 - 57.8 %   Platelets 387 150 - 400 K/uL   nRBC 0.0 0.0 - 0.2 %    Comment: Performed at [facility], [address] [place], [place] [address]  Urine Drug Screen, Qualitative     Status: Abnormal   Collection Time: 12/26/22  1:42 AM  Result Value Ref Range   Tricyclic, Ur Screen POSITIVE (A) NONE DETECTED   Amphetamines, Ur Screen NONE DETECTED NONE DETECTED   MDMA (Ecstasy)Ur Screen NONE DETECTED NONE DETECTED   Cocaine Metabolite,Ur [place] NONE DETECTED NONE DETECTED   Opiate, Ur Screen NONE DETECTED NONE DETECTED   Phencyclidine (PCP) Ur S NONE DETECTED NONE DETECTED   Cannabinoid 50 Ng, Ur  NONE DETECTED NONE DETECTED   Barbiturates, Ur Screen NONE DETECTED NONE DETECTED   Benzodiazepine, Ur Scrn POSITIVE (A) NONE DETECTED   Methadone Scn, Ur NONE DETECTED NONE DETECTED    Comment: (NOTE) Tricyclics + metabolites, urine    Cutoff 1000 ng/mL Amphetamines + metabolites, urine  Cutoff 1000 ng/mL MDMA (Ecstasy), urine              Cutoff 500 ng/mL Cocaine Metabolite, urine          Cutoff 300 ng/mL Opiate + metabolites, urine        Cutoff 300 ng/mL Phencyclidine (PCP), urine         Cutoff 25 ng/mL Cannabinoid, urine                 Cutoff 50 ng/mL Barbiturates + metabolites, urine  Cutoff 200 ng/mL Benzodiazepine, urine              Cutoff 200 ng/mL Methadone, urine                   Cutoff 300  ng/mL  The urine drug screen provides only a preliminary, unconfirmed analytical test result and should not be used for non-medical purposes. Clinical consideration and professional judgment should be applied to any positive drug screen result due to possible interfering substances. A more specific alternate chemical method must be used in order to obtain a confirmed analytical result. Gas chromatography / mass spectrometry (GC/MS) is the preferred confirm atory method. Performed at [facility], [address] [place], [place] [address]     No current facility-administered medications for this encounter.   Current Outpatient Medications  Medication Sig Dispense Refill   amLODipine (NORVASC) 5 MG tablet Take 1 tablet (5 mg total) by mouth daily. 30 tablet 2   clonazePAM (KLONOPIN) 0.5 MG tablet Take 0.5 mg by mouth 2 (two) times daily as needed for anxiety.     famotidine (PEPCID) 20 MG tablet Take 20 mg by mouth 2 (two) times daily as needed for heartburn or indigestion.     hydrOXYzine (ATARAX) 50 MG tablet Take 1 tablet by mouth 3 times daily as needed. 90 tablet 0   nicotine (NICODERM CQ - DOSED IN MG/24 HOURS) 14 mg/24hr patch Place 14 mg onto the skin every 14 (fourteen) days.     nicotine polacrilex (NICORETTE) 4 MG gum Place 4 mg inside cheek as needed for smoking cessation.     sertraline (ZOLOFT) 50 MG tablet Take 1 tablet by mouth daily.     acetaminophen (TYLENOL) 325 MG tablet Take 2 tablets (650 mg total) by mouth every 8 (eight) hours as needed for up to 30 doses for mild pain, moderate pain, fever or headache. (Patient not taking: Reported on 12/26/2022) 30 tablet 0   ALPRAZolam (XANAX) 0.5 MG tablet Take 1 mg by mouth 3 (three) times daily as needed. (Patient not taking: Reported on 12/26/2022)     HYDROcodone-acetaminophen (NORCO/VICODIN) 5-325 MG tablet Take 1 tablet by mouth every 8 (eight) hours as needed for up to 6 doses for severe pain (not relieved by other meds). (Patient not taking: Reported on 12/26/2022) 6 tablet 0   hydrOXYzine (VISTARIL) 50 MG capsule Take 1 capsule (50 mg total) by mouth 3 (three) times daily as needed. (Patient not taking: Reported on 12/26/2022) 30 capsule 0   ibuprofen (ADVIL) 800 MG tablet Take 1 tablet (800 mg total) by mouth every 8 (eight) hours as needed for up to 30 doses for mild pain or moderate pain. (Patient not taking: Reported on 12/26/2022) 30 tablet 0   meloxicam (MOBIC) 15 MG tablet Take 1 tablet (15 mg total) by mouth  daily. (Patient not taking: Reported on 12/26/2022) 30 tablet 1   methylPREDNISolone (MEDROL DOSEPAK) 4 MG TBPK tablet 6 day dose pack - take as directed (Patient not taking: Reported on 12/26/2022) 21 tablet 0    Musculoskeletal: Strength & Muscle Tone: within normal limits Gait & Station: normal Patient leans: N/A            Psychiatric Specialty Exam:  Presentation  General Appearance:  Appropriate for Environment  Eye Contact: Fair  Speech: Clear and Coherent; Normal Rate  Speech Volume: Normal  Handedness: Right   Mood and Affect  Mood: Anxious  Affect: Tearful   Thought Process  Thought Processes: Coherent; Goal Directed; Linear  Descriptions of Associations:Intact  Orientation:Full (Time, Place and Person)  Thought Content:Logical  History of Schizophrenia/Schizoaffective disorder:No Duration of Psychotic Symptoms:No Hallucinations:Hallucinations: None  Ideas of Reference:None  Suicidal Thoughts:Suicidal Thoughts: No  Homicidal Thoughts:Homicidal Thoughts: No  Sensorium  Memory: Immediate Fair  Judgment: Fair  Insight: Fair   Chartered certified accountant: Fair  Attention Span: Fair  Recall: [employer] of Knowledge: Fair  Language: Fair   Psychomotor Activity  Psychomotor Activity: Psychomotor Activity: Normal   Assets  Assets: Communication Skills; Desire for Improvement; Financial Resources/Insurance; Housing; Resilience; Physical Health; Social Support   Sleep  Sleep: Sleep: Poor   Physical Exam: Physical Exam Constitutional:      General: He is not in acute distress.    Appearance: He is not ill-appearing, toxic-appearing or diaphoretic.  Eyes:     General: No scleral icterus. Cardiovascular:     Rate and Rhythm: Normal rate.  Pulmonary:     Effort: Pulmonary effort is normal. No respiratory distress.  Neurological:     Mental Status: He is alert and oriented to person, place,  and time.  Psychiatric:        Attention and Perception: Attention and perception normal.        Mood and Affect: Mood is anxious. Affect is tearful.        Speech: Speech normal.        Behavior: Behavior normal. Behavior is cooperative.        Thought Content: Thought content normal.        Cognition and Memory: Cognition and memory normal.    Review of Systems  Constitutional:  Negative for chills and fever.  Respiratory:  Negative for shortness of breath.   Cardiovascular:  Negative for chest pain and palpitations.  Gastrointestinal:  Negative for abdominal pain.  Neurological:  Negative for headaches.  Psychiatric/Behavioral:  Positive for substance abuse. The patient is nervous/anxious and has insomnia.    Blood pressure (!) 144/92, pulse 72, temperature (!) 97.5 F (36.4 C), temperature source Oral, resp. rate 18, height 5\' 8"  (1.727 m), weight 92.9 kg, SpO2 98%. Body mass index is 31.14 kg/m.  Treatment Plan Summary: Daily contact with patient to assess and evaluate symptoms and progress in treatment, Medication management, and Plan    47 y/o male w/ history of unspecified anxiety disorder, mdd, gad, presenting to [facility] after making statements about being concerned he would hurt himself or others. At present he denies suicidal, homicidal ideations, auditory visual hallucinations or paranoia. Endorses significant anxiety that is affecting daily functioning. Collateral called for pt, his mother, who expresses significant safety concerns. Pt recommended for inpatient psychiatric admission.   -Start pt on CIWA -Start hydroxyzine 50mg  oral every 6 hours Prn anxiety/agitation or CIWA < or =10 for 72 hours -Start imodium 2-4mg  oral as need diarrhea or loose stools for 72 hours -Start ativan 1mg  oral every 6 hours PRN, CIWA >10 for 72 hours -Start zofran-odt 4mg  oral every 6 hours PRN nausea vomiting for 72 hours  Disposition: Recommend psychiatric Inpatient admission when medically  cleared. Supportive therapy provided about ongoing stressors.  Lauree Chandler, NP 12/26/2022 1:24 PM

## 2022-12-26 NOTE — ED Provider Notes (Signed)
[facility] Provider Note    Event Date/Time   First MD Initiated Contact with Patient 12/26/22 0143     (approximate)   History   Psychiatric Evaluation   HPI  Ronald Holz. is a 47 y.o. male who presents to the ED for evaluation of Psychiatric Evaluation   Patient presents to the ED due to concerns for withdrawing from his Xanax, racing thoughts and suicidal thoughts.  No particular plan and has not done anything yet to harm himself  Reports being on benzodiazepines chronically for more than 9 years.  It has been about a week or 2 since his last dose.   Physical Exam   Triage Vital Signs: ED Triage Vitals  Encounter Vitals Group     BP 12/26/22 0140 (!) 176/125     Systolic BP Percentile --      Diastolic BP Percentile --      Pulse Rate 12/26/22 0140 (!) 105     Resp 12/26/22 0140 20     Temp 12/26/22 0140 98.2 F (36.8 C)     Temp src --      SpO2 12/26/22 0140 96 %     Weight 12/26/22 0140 204 lb 12.9 oz (92.9 kg)     Height 12/26/22 0140 5\' 8"  (1.727 m)     Head Circumference --      Peak Flow --      Pain Score 12/26/22 0140 0     Pain Loc --      Pain Education --      Exclude from Growth Chart --     Most recent vital signs: Vitals:   12/26/22 0140  BP: (!) 176/125  Pulse: (!) 105  Resp: 20  Temp: 98.2 F (36.8 C)  SpO2: 96%    General: Awake, no distress.  Does seem somewhat anxious.  Has linear thoughts  CV:  Good peripheral perfusion.  Resp:  Normal effort.  Abd:  No distention.  MSK:  No deformity noted.  Neuro:  No focal deficits appreciated. Other:     ED Results / Procedures / Treatments   Labs (all labs ordered are listed, but only abnormal results are displayed) Labs Reviewed  COMPREHENSIVE METABOLIC PANEL - Abnormal; Notable for the following components:      Result Value   Sodium 134 (*)    Potassium 3.3 (*)    CO2 19 (*)    Glucose, Bld 161 (*)    Calcium 8.7 (*)    All other components  within normal limits  SALICYLATE LEVEL - Abnormal; Notable for the following components:   Salicylate Lvl <7.0 (*)    All other components within normal limits  ACETAMINOPHEN LEVEL - Abnormal; Notable for the following components:   Acetaminophen (Tylenol), Serum <10 (*)    All other components within normal limits  CBC - Abnormal; Notable for the following components:   WBC 15.3 (*)    All other components within normal limits  URINE DRUG SCREEN, QUALITATIVE ([facility] ONLY) - Abnormal; Notable for the following components:   Tricyclic, Ur Screen POSITIVE (*)    Benzodiazepine, Ur Scrn POSITIVE (*)    All other components within normal limits  ETHANOL    EKG   RADIOLOGY   Official radiology report(s): No results found.  PROCEDURES and INTERVENTIONS:  Procedures  Medications  LORazepam (ATIVAN) tablet 1 mg (1 mg Oral Given 12/26/22 0241)     IMPRESSION / MDM / ASSESSMENT AND PLAN /  ED COURSE  I reviewed the triage vital signs and the nursing notes.  Differential diagnosis includes, but is not limited to, malingering, withdrawals from benzodiazepines, polysubstance abuse or acute intoxication  {Patient presents with symptoms of an acute illness or injury that is potentially life-threatening.  Patient presents with vague suicidal thoughts and anxiety in the setting of withdrawing from benzodiazepines.  No clear plan to harm himself so we will keep him voluntary for now, provide a small dose of oral Ativan and consult with psychiatry.  No signs of medical pathology to preclude psychiatric evaluation.  Nonspecific leukocytosis but no infectious symptoms or features on exam.  Minimal non-anion gap metabolic acidosis.  Negative ethanol level.  Awaiting psychiatric evaluation      FINAL CLINICAL IMPRESSION(S) / ED DIAGNOSES   Final diagnoses:  Suicidal thoughts  Benzodiazepine withdrawal with complication (HCC)  Anxiety     Rx / DC Orders   ED Discharge Orders     None         Note:  This document was prepared using Dragon voice recognition software and may include unintentional dictation errors.   Delton Prairie, MD 12/26/22 [phone]

## 2022-12-26 NOTE — ED Notes (Signed)
Pt belongings:  Cigarettes Vape Grey sweatshirt White tshirt Blue pants [employer] Black socks Black boxers

## 2022-12-26 NOTE — ED Notes (Signed)
Pt anxious, tearful.  Meds given.

## 2022-12-26 NOTE — ED Notes (Addendum)
Pt denied shower this morning when asked.Nurse Toniann Fail was Notified.

## 2022-12-26 NOTE — ED Notes (Signed)
VOL  GOING TO  [facility]  TONIGHT 

## 2022-12-26 NOTE — Group Note (Signed)
Date:  12/27/2022 Time:  12:03 AM  Group Topic/Focus:  Wrap-Up Group:   The focus of this group is to help patients review their daily goal of treatment and discuss progress on daily workbooks.    Participation Level:  Did Not Attend   Lenore Cordia 12/27/2022, 12:03 AM

## 2022-12-26 NOTE — ED Notes (Signed)
Np Annice Pih ) talking with Patient at this time, no signs of distress.

## 2022-12-26 NOTE — ED Triage Notes (Signed)
Pt to ED VOL, Pt reports he was just seen at [facility] psych 2 weeks ago. Pt reports being worried over everything recently and "feels like he is going to lose control" pt reports feeling anxious over having blood pressure taken and anxious over leaving his house. Pt calm and cooperative. Pt states tonight he had a rush of thoughts where he was scared he was going to hurt himself or others.

## 2022-12-26 NOTE — ED Notes (Signed)
Patient received lunch tray and beverage. 

## 2022-12-26 NOTE — ED Notes (Addendum)
Nurse spoke with Patient and He states that His anxiety starting going to the roof 4 weeks ago when he stopped His clonazepam because He wanted to get off of them, He did not know to wean off of this medication and went cold [place], He voices that He was taking .5mg  BID and now He feels anxious and depressed, does not want to leave the house because He is afraid something bad is going to happen. Nurse listened, showed empathy and let him know the NP would be in to talk with him and devise a plan for His care.

## 2022-12-26 NOTE — ED Provider Notes (Signed)
-----------------------------------------   5:15 PM on 12/26/2022 -----------------------------------------   Blood pressure 138/89, pulse 77, temperature 97.9 F (36.6 C), temperature source Oral, resp. rate 20, height 5\' 8"  (1.727 m), weight 92.9 kg, SpO2 98%.  The patient is calm and cooperative at this time.  Patient accepted to [facility].   Janith Lima, MD 12/26/22 [phone]

## 2022-12-26 NOTE — ED Notes (Signed)
Patient received breakfast and beverage, no signs of distress, will continue to monitor for safety.

## 2022-12-26 NOTE — ED Notes (Signed)
Patient is calm and cooperative, no signs of distress.

## 2022-12-27 ENCOUNTER — Encounter: Payer: Self-pay | Admitting: Psychiatry

## 2022-12-27 DIAGNOSIS — F411 Generalized anxiety disorder: Secondary | ICD-10-CM | POA: Diagnosis present

## 2022-12-27 DIAGNOSIS — F332 Major depressive disorder, recurrent severe without psychotic features: Principal | ICD-10-CM

## 2022-12-27 MED ORDER — THIAMINE MONONITRATE 100 MG PO TABS
100.0000 mg | ORAL_TABLET | Freq: Every day | ORAL | Status: DC
  Administered 2022-12-28 – 2023-01-07 (×11): 100 mg via ORAL
  Filled 2022-12-27 (×10): qty 1

## 2022-12-27 MED ORDER — LORAZEPAM 1 MG PO TABS
1.0000 mg | ORAL_TABLET | Freq: Two times a day (BID) | ORAL | Status: AC
  Administered 2022-12-29 – 2022-12-30 (×2): 1 mg via ORAL
  Filled 2022-12-27 (×2): qty 1

## 2022-12-27 MED ORDER — NICOTINE 21 MG/24HR TD PT24
21.0000 mg | MEDICATED_PATCH | Freq: Every day | TRANSDERMAL | Status: DC
  Administered 2022-12-27 – 2023-01-07 (×6): 21 mg via TRANSDERMAL
  Filled 2022-12-27 (×12): qty 1

## 2022-12-27 MED ORDER — LORAZEPAM 1 MG PO TABS: 1.0000 mg | ORAL_TABLET | Freq: Four times a day (QID) | ORAL | Status: AC | PRN

## 2022-12-27 MED ORDER — THIAMINE HCL 100 MG/ML IJ SOLN
100.0000 mg | Freq: Once | INTRAMUSCULAR | Status: AC
  Administered 2022-12-27: 100 mg via INTRAMUSCULAR
  Filled 2022-12-27: qty 2

## 2022-12-27 MED ORDER — ESCITALOPRAM OXALATE 10 MG PO TABS
10.0000 mg | ORAL_TABLET | Freq: Every day | ORAL | Status: DC
  Administered 2022-12-27 – 2023-01-07 (×12): 10 mg via ORAL
  Filled 2022-12-27 (×12): qty 1

## 2022-12-27 MED ORDER — LORAZEPAM 1 MG PO TABS
1.0000 mg | ORAL_TABLET | Freq: Three times a day (TID) | ORAL | Status: AC
  Administered 2022-12-28 – 2022-12-29 (×3): 1 mg via ORAL
  Filled 2022-12-27 (×3): qty 1

## 2022-12-27 MED ORDER — LORAZEPAM 1 MG PO TABS
1.0000 mg | ORAL_TABLET | Freq: Every day | ORAL | Status: AC
  Administered 2022-12-31: 1 mg via ORAL
  Filled 2022-12-27: qty 1

## 2022-12-27 MED ORDER — QUETIAPINE FUMARATE 25 MG PO TABS
50.0000 mg | ORAL_TABLET | Freq: Every day | ORAL | Status: DC
  Administered 2022-12-27: 50 mg via ORAL
  Filled 2022-12-27: qty 2

## 2022-12-27 MED ORDER — LORAZEPAM 1 MG PO TABS
1.0000 mg | ORAL_TABLET | Freq: Four times a day (QID) | ORAL | Status: AC
  Administered 2022-12-27 – 2022-12-28 (×4): 1 mg via ORAL
  Filled 2022-12-27 (×4): qty 1

## 2022-12-27 MED ORDER — AMLODIPINE BESYLATE 5 MG PO TABS
5.0000 mg | ORAL_TABLET | Freq: Every day | ORAL | Status: DC
  Administered 2022-12-27 – 2023-01-07 (×12): 5 mg via ORAL
  Filled 2022-12-27 (×12): qty 1

## 2022-12-27 MED ORDER — ADULT MULTIVITAMIN W/MINERALS CH
1.0000 | ORAL_TABLET | Freq: Every day | ORAL | Status: DC
  Administered 2022-12-27 – 2023-01-07 (×12): 1 via ORAL
  Filled 2022-12-27 (×12): qty 1

## 2022-12-27 MED ORDER — HYDROXYZINE HCL 25 MG PO TABS
25.0000 mg | ORAL_TABLET | Freq: Four times a day (QID) | ORAL | Status: AC | PRN
  Administered 2022-12-29 – 2022-12-30 (×3): 25 mg via ORAL
  Filled 2022-12-27 (×3): qty 1

## 2022-12-27 NOTE — Progress Notes (Signed)
   12/27/22 1200  CIWA-Ar  Nausea and Vomiting 0  Tactile Disturbances 0  Tremor 2  Auditory Disturbances 0  Paroxysmal Sweats 0  Visual Disturbances 0  Anxiety 3  Headache, Fullness in Head 0  Agitation 0  Orientation and Clouding of Sensorium 0  CIWA-Ar Total 5

## 2022-12-27 NOTE — Progress Notes (Signed)
ADMISSION NOTE:   Pt is a 47 y.o. [place] male who voluntarily admitted to BMU.  Pt reported that he's here for a psych evaluation r/t " I feel like I'm losing control."  Pt is alert and oriented x 4.  Pt present with labile affect and sad mood. Pt denies SI, and/or AVH, reports occasional HI. Pt rates in anxiety/depression 7/10. Pt denied using illicit drugs.    RN and pt discussed pt's admission plan of care and consents signed.  Pt verbalized understanding of plan of care.  RN oriented pt to the staff, unit, and room.  Skin assessment was completed.  No negative finding noted. Pt's belongings secured in locker. Routine safety checks initiated.  Pt is safe on the unit.

## 2022-12-27 NOTE — Progress Notes (Signed)
Patient stated to this writer that he had a panic attack when coming out for breakfast. Patient also stated that he was having thoughts, "that aren't me and I don't want to hurt nobody. Everything about me is off". Patient was given PRN medication for agitation/anxiety. Patient tolerated medication well, without any issues. Patient remains safe on the unit. Patient will continue to be monitored. Provider was notified face-to-face of patient concerns.

## 2022-12-27 NOTE — Plan of Care (Signed)

## 2022-12-27 NOTE — [facility] Suicide Risk Assessment (Signed)
[facility] Admission Suicide Risk Assessment   Nursing information obtained from:  Patient Demographic factors:  Male, Caucasian Current Mental Status:  Thoughts of violence towards others Loss Factors:  NA Historical Factors:  NA Risk Reduction Factors:  Positive social support  Total Time spent with patient: 1 hour Principal Problem: Major depressive disorder, recurrent severe without psychotic features (HCC) Diagnosis:  Principal Problem:   Major depressive disorder, recurrent severe without psychotic features (HCC) Active Problems:   Benzodiazepine withdrawal (HCC)   Generalized anxiety disorder  Subjective Data: Suicidal thoughts with a plan  History of Present Illness: 47 y/o male admitted for benzodiazepine withdrawal with major depressive disorder. "I was feeling like I was going to die. I have been taking Klonopin for 9 years. 5 years it was prescribed to me then I was taking it unprescribed for 4 years, but taking it like it was prescribed. I would take two 0.5mg  per day." Client was prescribed Klonopin by [facility], 5 years ago he tried to take Buspar (prescribed) and had an allergic reaction. At that time the provided did not prescribe him Klonopin after the failed Buspar and began buying Klonopin unprescribed. Reports last Klonopin was taken 11/12 and took his last Valium this past Saturday. Reports being admitted to [facility] two weeks ago for the same reason where they gave him Xanax. Client denies taking other benzodiazepines from off of the street despite what his past notes say. When informing client that the notes state he has gotten medications from the streets recently, he immediately gets defensive and blaming others, stating they are lying. Reports being to the ER 12/13 times within the last two months with panic attacks. Client reports being diagnosed with depression, anxiety, PTSD and insomnia. Family psychiatric history is unknown. Client received ECT at [facility] years ago. In his  teenage years  client reports self harming by cutting himself, has not self harmed since. "When I cut myself it always involved alcohol". Was admitted to an alcohol rehab center when he was 47 years old. Reports struggling with alcohol in the past but quit cold [place] in July without any cravings or withdrawal symptoms. Denies using substances other than benzodiazepines. Client smokes cigarettes and vapes nicotine. When not vaping he goes through a pack of cigarettes per day, when vaping he will only smoke 3 cigarettes in a day. Sleep is not good most nights, he doses on and off. Loss of appetite and reports losing weight since last admission (2 weeks ago).  Reports serve depression, denies suicidal ideation. Anxiety is severe with 3/4 panic attacks per day, "I feel like I am going to die". These attacks present with chest tightness, increased in BP and feeling like a heart attack, he reports this feeling lasts for hours. Denies any traumas, denies flashbacks or nightmares. Client is tearful and expresses his fear that he is going to hurt people. "I feel like I have no self control. I don't want to hurt anyone. I get these thoughts in my head and go back to my room. I asked the nurse last night, if I asked to be alone and locked in a room can they do that for me". Client reports not having homicidal ideations, just feels as if he can't control his reactions at this time. Denies hallucinations and paranoia. Client is positive for tremors. Denies benzodiazepine cravings.    Mikelle lives with his mother. He got his GED, had a job 2 months ago but the company closed and has not been in  the right head space to get another job due to fear of being around people and not controlling his actions. Reports not having insurance but is in the process of getting Medicaid. Wants his Mother [name] [phone]) updated on the situation. He fears he will be kicked out of the house and have no where to go if she knows what  is happening.     Continued Clinical Symptoms:    The "Alcohol Use Disorders Identification Test", Guidelines for Use in Primary Care, Second Edition.  World Science writer [facility]). Score between 0-7:  no or low risk or alcohol related problems. Score between 8-15:  moderate risk of alcohol related problems. Score between 16-19:  high risk of alcohol related problems. Score 20 or above:  warrants further diagnostic evaluation for alcohol dependence and treatment.   CLINICAL FACTORS:   Depression:   Severe   Musculoskeletal: Strength & Muscle Tone: within normal limits Gait & Station: normal Patient leans: N/A  Psychiatric Specialty Exam: Physical Exam Vitals and nursing note reviewed.  Constitutional:      Appearance: Normal appearance.  HENT:     Head: Normocephalic.     Nose: Nose normal.  Pulmonary:     Effort: Pulmonary effort is normal.  Musculoskeletal:        General: Normal range of motion.     Cervical back: Normal range of motion.  Neurological:     General: No focal deficit present.     Mental Status: He is alert and oriented to person, place, and time.  Psychiatric:        Mood and Affect: Mood normal.       Review of Systems  Psychiatric/Behavioral:  Positive for depression and substance abuse. The patient is nervous/anxious.   All other systems reviewed and are negative.    Blood pressure (!) 141/98, pulse 62, temperature 98.2 F (36.8 C), resp. rate 16, height 5\' 7"  (1.702 m), weight 87.1 kg, SpO2 94%.Body mass index is 30.07 kg/m.  General Appearance: Fairly Groomed  Eye Contact:  Fair  Speech:  Clear and Coherent and Normal Rate  Volume:  Normal  Mood:  Anxious and Depressed  Affect:  Depressed and Tearful  Thought Process:  Coherent  Orientation:  Full (Time, Place, and Person)  Thought Content:  WDL  Suicidal Thoughts:  No  Homicidal Thoughts:  No  Memory:  Immediate;   Fair Recent;   Fair Remote;   Fair  Judgement:  Fair  Insight:   Fair  Psychomotor Activity:  Tremor  Concentration:  Concentration: Fair and Attention Span: Fair  Recall:  [employer] of Knowledge:  Fair  Language:  Good  Akathisia:  Negative  Handed:  Right  AIMS (if indicated):     Assets:  Desire for Improvement Housing Resilience  ADL's:  Intact  Cognition:  WNL  Sleep:  Poor     Physical Exam: Physical Exam Vitals and nursing note reviewed.  Constitutional:      Appearance: Normal appearance.  HENT:     Head: Normocephalic.     Nose: Nose normal.  Pulmonary:     Effort: Pulmonary effort is normal.  Musculoskeletal:        General: Normal range of motion.     Cervical back: Normal range of motion.  Neurological:     General: No focal deficit present.     Mental Status: He is alert and oriented to person, place, and time.    Review of Systems  Psychiatric/Behavioral:  Positive for depression and substance abuse. The patient is nervous/anxious.   All other systems reviewed and are negative.  Blood pressure (!) 141/98, pulse 62, temperature 98.2 F (36.8 C), resp. rate 16, height 5\' 7"  (1.702 m), weight 87.1 kg, SpO2 94%. Body mass index is 30.07 kg/m.   COGNITIVE FEATURES THAT CONTRIBUTE TO RISK:  None    SUICIDE RISK:   Minimal: No identifiable suicidal ideation.  Patients presenting with no risk factors but with morbid ruminations; may be classified as minimal risk based on the severity of the depressive symptoms  PLAN OF CARE:  Major depressive disorder, recurrent, severe without psychosis: Started Lexapro 10 mg daily  Benzodiazepine withdrawal: Ativan detox protocol started  Insomnia: Trazodone 50 mg daily at bedtime PRN  I certify that inpatient services furnished can reasonably be expected to improve the patient's condition.   Nanine Means, NP 12/27/2022, 6:42 AM

## 2022-12-27 NOTE — Group Note (Signed)
Recreation Therapy Group Note   Group Topic:Relaxation  Group Date: 12/27/2022 Start Time: 1000 End Time: 1050 Facilitators: Rosina Lowenstein, LRT, CTRS Location:  C[facility] Room  Group Description: PMR (Progressive Muscle Relaxation). LRT asks patients their current level of stress/anxiety from 1-10, with 10 being the highest. LRT educates patients on what PMR is and the benefits that come from it. Patients are asked to sit with their feet flat on the floor while sitting up and all the way back in their chair, if possible. LRT and pts follow a prompt through a speaker that requires you to tense and release different muscles in their body and focus on their breathing. During session, lights are off and soft music is being played. Pts are given a stress ball to use if needed. At the end of the prompt, LRT asks patients to rank their current levels of stress/anxiety from 1-10, 10 being the highest. LRT provides patients with an education handout on PMR.   Goal Area(s) Addressed:  Patients will be able to describe progressive muscle relaxation.  Patient will practice using relaxation technique. Patient will identify a new coping skill.  Patient will follow multistep directions to reduce anxiety and stress.   Affect/Mood: N/A   Participation Level: Did not attend    Clinical Observations/Individualized Feedback: Ronald [name] did not attend group.   Plan: Continue to engage patient in RT group sessions 2-3x/week.   Rosina Lowenstein, LRT, CTRS 12/27/2022 11:35 AM
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
## 2022-12-27 NOTE — H&P (Signed)
Psychiatric Admission Assessment Adult  Patient Identification: Ronald [name]. MRN:  [id number] Date of Evaluation:  12/27/2022 Chief Complaint:  Benzodiazepine withdrawal (HCC) [F13.939] Principal Diagnosis: Major depressive disorder, recurrent severe without psychotic features (HCC) Diagnosis:  Principal Problem:   Major depressive disorder, recurrent severe without psychotic features (HCC) Active Problems:   Benzodiazepine withdrawal (HCC)   Generalized anxiety disorder  History of Present Illness: 47 y/o male admitted for benzodiazepine withdrawal with major depressive disorder. "I was feeling like I was going to die. I have been taking Klonopin for 9 years. 5 years it was prescribed to me then I was taking it unprescribed for 4 years, but taking it like it was prescribed. I would take two 0.5mg  per day." Client was prescribed Klonopin by [facility], 5 years ago he tried to take Buspar (prescribed) and had an allergic reaction. At that time the provided did not prescribe him Klonopin after the failed Buspar and began buying Klonopin unprescribed. Reports last Klonopin was taken 11/12 and took his last Valium this past Saturday. Reports being admitted to [facility] two weeks ago for the same reason where they gave him Xanax. Client denies taking other benzodiazepines from off of the street despite what his past notes say. When informing client that the notes state he has gotten medications from the streets recently, he immediately gets defensive and blaming others, stating they are lying. Reports being to the ER 12/13 times within the last two months with panic attacks. Client reports being diagnosed with depression, anxiety, PTSD and insomnia. Family psychiatric history is unknown. Client received ECT at [employer] years ago. In his teenage years  client reports self harming by cutting himself, has not self harmed since. "When I cut myself it always involved alcohol". Was admitted to an alcohol rehab  center when he was 47 years old. Reports struggling with alcohol in the past but quit cold [place] in July without any cravings or withdrawal symptoms. Denies using substances other than benzodiazepines. Client smokes cigarettes and vapes nicotine. When not vaping he goes through a pack of cigarettes per day, when vaping he will only smoke 3 cigarettes in a day. Sleep is not good most nights, he doses on and off. Loss of appetite and reports losing weight since last admission (2 weeks ago).  Reports serve depression, denies suicidal ideation. Anxiety is severe with 3/4 panic attacks per day, "I feel like I am going to die". These attacks present with chest tightness, increased in BP and feeling like a heart attack, he reports this feeling lasts for hours. Denies any traumas, denies flashbacks or nightmares. Client is tearful and expresses his fear that he is going to hurt people. "I feel like I have no self control. I don't want to hurt anyone. I get these thoughts in my head and go back to my room. I asked the nurse last night, if I asked to be alone and locked in a room can they do that for me". Client reports not having homicidal ideations, just feels as if he can't control his reactions at this time. Denies hallucinations and paranoia. Client is positive for tremors. Denies benzodiazepine cravings.   Dmorea lives with his mother. He got his GED, had a job 2 months ago but the company closed and has not been in the right head space to get another job due to fear of being around people and not controlling his actions. Reports not having insurance but is in the process of getting Medicaid. Wants  his Mother Ronald [name] [phone]) updated on the situation. He fears he will be kicked out of the house and have no where to go if she knows what is happening.   Associated Signs/Symptoms: Depression Symptoms:  depressed mood, insomnia, fatigue, feelings of worthlessness/guilt, hopelessness, anxiety, panic  attacks, loss of energy/fatigue, weight loss, (Hypo) Manic Symptoms:   None Anxiety Symptoms:  Excessive Worry, Panic Symptoms, Social Anxiety, Psychotic Symptoms:   None PTSD Symptoms: Negative  Total Time spent with patient: 1 hour  Past Psychiatric History: Major depressive disorder, anxiety, PTSD, insomnia   Is the patient at risk to self? Yes.    Has the patient been a risk to self in the past 6 months? Yes.    Has the patient been a risk to self within the distant past? Yes.    Is the patient a risk to others? No.  Has the patient been a risk to others in the past 6 months? No.  Has the patient been a risk to others within the distant past? No.   [place] Scale:  Flowsheet Row Admission (Current) from 12/26/2022 in [facility] INPAT[facility]ICINE Most recent reading at 12/26/2022  9:00 PM ED from 12/26/2022 in [facility] Emergency Department at [facility] Most recent reading at 12/26/2022  1:41 AM ED from 12/04/2022 in [facility] Emergency Department at [facility] Most recent reading at 12/04/2022  2:21 PM  C-SSRS RISK CATEGORY No Risk No Risk No Risk        Prior Inpatient Therapy: Yes.   If yes, [facility], [facility] Psych inpatient  Prior Outpatient Therapy: Yes.   If yes, describe [facility]    Alcohol Screening: Patient refused Alcohol Screening Tool: Yes 1. How often do you have a drink containing alcohol?: Never 2. How many drinks containing alcohol do you have on a typical day when you are drinking?: 1 or 2 3. How often do you have six or more drinks on one occasion?: Never AUDIT-C Score: 0 Alcohol Brief Interventions/Follow-up: Patient Refused Substance Abuse History in the last 12 months:  Yes.   Consequences of Substance Abuse: Family Consequences:  Living arrangement may be revoked  Withdrawal Symptoms:   Tremors Previous Psychotropic Medications: Yes  Psychological Evaluations: Yes  Past Medical History:  Past Medical History:  Diagnosis Date    Anxiety    Depression    Family history of adverse reaction to anesthesia    difficult to wake up - Mother   GERD (gastroesophageal reflux disease)    PTSD (post-traumatic stress disorder)     Past Surgical History:  Procedure Laterality Date   ELECTROCONVULSIVE THERAPY     FRACTURE SURGERY     wrist left   INSERTION OF MESH  08/25/2021   Procedure: INSERTION OF MESH;  Surgeon: Sung Amabile, DO;  Location: [facility] ORS;  Service: General;;   UMBILICAL HERNIA REPAIR  08/25/2021   Procedure: HERNIA REPAIR UMBILICAL ADULT;  Surgeon: Sung Amabile, DO;  Location: [facility] ORS;  Service: General;;   Family History: History reviewed. No pertinent family history. Family Psychiatric  History: Unknown Tobacco Screening:  Social History   Tobacco Use  Smoking Status Every Day   Current packs/day: 1.00   Types: Cigarettes  Smokeless Tobacco Never    BH Tobacco Counseling     Are you interested in Tobacco Cessation Medications?  No value filed. Counseled patient on smoking cessation:  No value filed. Reason Tobacco Screening Not Completed: No value filed.       Social  History:  Social History   Substance and Sexual Activity  Alcohol Use Yes   Comment: occasional     Social History   Substance and Sexual Activity  Drug Use No    Additional Social History:  GED Lives with Mother   Allergies:   Allergies  Allergen Reactions   Buspar [Buspirone]    Lab Results:  Results for orders placed or performed during the hospital encounter of 12/26/22 (from the past 48 hour(s))  Comprehensive metabolic panel     Status: Abnormal   Collection Time: 12/26/22  1:42 AM  Result Value Ref Range   Sodium 134 (L) 135 - 145 mmol/L   Potassium 3.3 (L) 3.5 - 5.1 mmol/L   Chloride 107 98 - 111 mmol/L   CO2 19 (L) 22 - 32 mmol/L   Glucose, Bld 161 (H) 70 - 99 mg/dL    Comment: Glucose reference range applies only to samples taken after fasting for at least 8 hours.   BUN 12 6 - 20 mg/dL    Creatinine, Ser 1.61 0.61 - 1.24 mg/dL   Calcium 8.7 (L) 8.9 - 10.3 mg/dL   Total Protein 8.0 6.5 - 8.1 g/dL   Albumin 4.7 3.5 - 5.0 g/dL   AST 21 15 - 41 U/L   ALT 35 0 - 44 U/L   Alkaline Phosphatase 75 38 - 126 U/L   Total Bilirubin 0.8 <1.2 mg/dL   GFR, Estimated >09 >60 mL/min    Comment: (NOTE) Calculated using the CKD-EPI Creatinine Equation (2021)    Anion gap 8 5 - 15    Comment: Performed at [facility], [address] [place], [place] [address]  Ethanol     Status: None   Collection Time: 12/26/22  1:42 AM  Result Value Ref Range   Alcohol, Ethyl (B) <10 <10 mg/dL    Comment: (NOTE) Lowest detectable limit for serum alcohol is 10 mg/dL.  For medical purposes only. Performed at [facility], [address] [place], [place] [address]   Salicylate level     Status: Abnormal   Collection Time: 12/26/22  1:42 AM  Result Value Ref Range   Salicylate Lvl <7.0 (L) 7.0 - 30.0 mg/dL    Comment: Performed at [facility], [address] [place], [place] [address]  Acetaminophen level     Status: Abnormal   Collection Time: 12/26/22  1:42 AM  Result Value Ref Range   Acetaminophen (Tylenol), Serum <10 (L) 10 - 30 ug/mL    Comment: (NOTE) Therapeutic concentrations vary significantly. A range of 10-30 ug/mL  may be an effective concentration for many patients. However, some  are best treated at concentrations outside of this range. Acetaminophen concentrations >150 ug/mL at 4 hours after ingestion  and >50 ug/mL at 12 hours after ingestion are often associated with  toxic reactions.  Performed at [facility], [address] [place], [place] [address]   cbc     Status: Abnormal   Collection Time: 12/26/22  1:42 AM  Result Value Ref Range   WBC 15.3 (H) 4.0 - 10.5 K/uL   RBC 5.33 4.22 - 5.81 MIL/uL   Hemoglobin 16.1 13.0 - 17.0 g/dL   HCT 08.6 57.8 - 46.9 %   MCV 88.2 80.0 - 100.0 fL   MCH 30.2 26.0 - 34.0 pg   MCHC 34.3  30.0 - 36.0 g/dL   RDW 62.9 52.8 - 41.3 %   Platelets 387 150 - 400 K/uL  nRBC 0.0 0.0 - 0.2 %    Comment: Performed at [facility], [address] [place], [place] [address]  Urine Drug Screen, Qualitative     Status: Abnormal   Collection Time: 12/26/22  1:42 AM  Result Value Ref Range   Tricyclic, Ur Screen POSITIVE (A) NONE DETECTED   Amphetamines, Ur Screen NONE DETECTED NONE DETECTED   MDMA (Ecstasy)Ur Screen NONE DETECTED NONE DETECTED   Cocaine Metabolite,Ur [place] NONE DETECTED NONE DETECTED   Opiate, Ur Screen NONE DETECTED NONE DETECTED   Phencyclidine (PCP) Ur S NONE DETECTED NONE DETECTED   Cannabinoid 50 Ng, Ur [place] NONE DETECTED NONE DETECTED   Barbiturates, Ur Screen NONE DETECTED NONE DETECTED   Benzodiazepine, Ur Scrn POSITIVE (A) NONE DETECTED   Methadone Scn, Ur NONE DETECTED NONE DETECTED    Comment: (NOTE) Tricyclics + metabolites, urine    Cutoff 1000 ng/mL Amphetamines + metabolites, urine  Cutoff 1000 ng/mL MDMA (Ecstasy), urine              Cutoff 500 ng/mL Cocaine Metabolite, urine          Cutoff 300 ng/mL Opiate + metabolites, urine        Cutoff 300 ng/mL Phencyclidine (PCP), urine         Cutoff 25 ng/mL Cannabinoid, urine                 Cutoff 50 ng/mL Barbiturates + metabolites, urine  Cutoff 200 ng/mL Benzodiazepine, urine              Cutoff 200 ng/mL Methadone, urine                   Cutoff 300 ng/mL  The urine drug screen provides only a preliminary, unconfirmed analytical test result and should not be used for non-medical purposes. Clinical consideration and professional judgment should be applied to any positive drug screen result due to possible interfering substances. A more specific alternate chemical method must be used in order to obtain a confirmed analytical result. Gas chromatography / mass spectrometry (GC/MS) is the preferred confirm atory method. Performed at [facility], [address] [place], [place]  [address]     Blood Alcohol level:  Lab Results  Component Value Date   [facility] <10 12/26/2022   ETH 196 (H) 09/08/2018    Metabolic Disorder Labs:  No results found for: "HGBA1C", "MPG" No results found for: "PROLACTIN" No results found for: "CHOL", "TRIG", "HDL", "CHOLHDL", "VLDL", "LDLCALC"  Current Medications: Current Facility-Administered Medications  Medication Dose Route Frequency Provider Last Rate Last Admin   acetaminophen (TYLENOL) tablet 650 mg  650 mg Oral Q6H PRN Lauree Chandler, NP       alum & mag hydroxide-simeth (MAALOX/MYLANTA) 200-200-20 MG/5ML suspension 30 mL  30 mL Oral Q4H PRN Lauree Chandler, NP       amLODipine (NORVASC) tablet 5 mg  5 mg Oral Daily Charm Rings, NP       haloperidol (HALDOL) tablet 5 mg  5 mg Oral TID PRN Lauree Chandler, NP       And   diphenhydrAMINE (BENADRYL) capsule 50 mg  50 mg Oral TID PRN Lauree Chandler, NP       escitalopram (LEXAPRO) tablet 10 mg  10 mg Oral Daily Charm Rings, NP       hydrOXYzine (ATARAX) tablet 50 mg  50 mg Oral Q6H PRN Lauree Chandler, NP  loperamide (IMODIUM) capsule 2-4 mg  2-4 mg Oral PRN Lauree Chandler, NP       LORazepam (ATIVAN) tablet 1 mg  1 mg Oral Q6H PRN Lauree Chandler, NP       magnesium hydroxide (MILK OF MAGNESIA) suspension 30 mL  30 mL Oral Daily PRN Lauree Chandler, NP       ondansetron (ZOFRAN-ODT) disintegrating tablet 4 mg  4 mg Oral Q6H PRN Lauree Chandler, NP       traZODone (DESYREL) tablet 50 mg  50 mg Oral QHS PRN Lauree Chandler, NP       PTA Medications: Medications Prior to Admission  Medication Sig Dispense Refill Last Dose   acetaminophen (TYLENOL) 325 MG tablet Take 2 tablets (650 mg total) by mouth every 8 (eight) hours as needed for up to 30 doses for mild pain, moderate pain, fever or headache. (Patient not taking: Reported on 12/26/2022) 30 tablet 0    ALPRAZolam (XANAX) 0.5 MG tablet Take 1 mg by mouth 3 (three) times daily  as needed. (Patient not taking: Reported on 12/26/2022)      amLODipine (NORVASC) 5 MG tablet Take 1 tablet (5 mg total) by mouth daily. 30 tablet 2    clonazePAM (KLONOPIN) 0.5 MG tablet Take 0.5 mg by mouth 2 (two) times daily as needed for anxiety.      famotidine (PEPCID) 20 MG tablet Take 20 mg by mouth 2 (two) times daily as needed for heartburn or indigestion.      HYDROcodone-acetaminophen (NORCO/VICODIN) 5-325 MG tablet Take 1 tablet by mouth every 8 (eight) hours as needed for up to 6 doses for severe pain (not relieved by other meds). (Patient not taking: Reported on 12/26/2022) 6 tablet 0    hydrOXYzine (ATARAX) 50 MG tablet Take 1 tablet by mouth 3 times daily as needed. 90 tablet 0    hydrOXYzine (VISTARIL) 50 MG capsule Take 1 capsule (50 mg total) by mouth 3 (three) times daily as needed. (Patient not taking: Reported on 12/26/2022) 30 capsule 0    ibuprofen (ADVIL) 800 MG tablet Take 1 tablet (800 mg total) by mouth every 8 (eight) hours as needed for up to 30 doses for mild pain or moderate pain. (Patient not taking: Reported on 12/26/2022) 30 tablet 0    meloxicam (MOBIC) 15 MG tablet Take 1 tablet (15 mg total) by mouth daily. (Patient not taking: Reported on 12/26/2022) 30 tablet 1    methylPREDNISolone (MEDROL DOSEPAK) 4 MG TBPK tablet 6 day dose pack - take as directed (Patient not taking: Reported on 12/26/2022) 21 tablet 0    nicotine (NICODERM CQ - DOSED IN MG/24 HOURS) 14 mg/24hr patch Place 14 mg onto the skin every 14 (fourteen) days.      nicotine polacrilex (NICORETTE) 4 MG gum Place 4 mg inside cheek as needed for smoking cessation.      sertraline (ZOLOFT) 50 MG tablet Take 1 tablet by mouth daily.       Musculoskeletal: Strength & Muscle Tone: within normal limits Gait & Station: normal Patient leans: N/A  Psychiatric Specialty Exam: Physical Exam Vitals and nursing note reviewed.  Constitutional:      Appearance: Normal appearance.  HENT:     Head:  Normocephalic.     Nose: Nose normal.  Pulmonary:     Effort: Pulmonary effort is normal.  Musculoskeletal:        General: Normal range of motion.     Cervical back: Normal range  of motion.  Neurological:     General: No focal deficit present.     Mental Status: He is alert and oriented to person, place, and time.  Psychiatric:        Mood and Affect: Mood normal.     Review of Systems  Psychiatric/Behavioral:  Positive for depression and substance abuse. The patient is nervous/anxious.   All other systems reviewed and are negative.   Blood pressure (!) 141/98, pulse 62, temperature 98.2 F (36.8 C), resp. rate 16, height 5\' 7"  (1.702 m), weight 87.1 kg, SpO2 94%.Body mass index is 30.07 kg/m.  General Appearance: Fairly Groomed  Eye Contact:  Fair  Speech:  Clear and Coherent and Normal Rate  Volume:  Normal  Mood:  Anxious and Depressed  Affect:  Depressed and Tearful  Thought Process:  Coherent  Orientation:  Full (Time, Place, and Person)  Thought Content:  WDL  Suicidal Thoughts:  No  Homicidal Thoughts:  No  Memory:  Immediate;   Fair Recent;   Fair Remote;   Fair  Judgement:  Fair  Insight:  Fair  Psychomotor Activity:  Tremor  Concentration:  Concentration: Fair and Attention Span: Fair  Recall:  [employer] of Knowledge:  Fair  Language:  Good  Akathisia:  Negative  Handed:  Right  AIMS (if indicated):     Assets:  Desire for Improvement Housing Resilience  ADL's:  Intact  Cognition:  WNL  Sleep:  Poor    Physical Exam: Physical Exam Vitals and nursing note reviewed.  Constitutional:      Appearance: Normal appearance.  HENT:     Head: Normocephalic.     Nose: Nose normal.  Pulmonary:     Effort: Pulmonary effort is normal.  Musculoskeletal:        General: Normal range of motion.     Cervical back: Normal range of motion.  Neurological:     General: No focal deficit present.     Mental Status: He is alert and oriented to person, place, and  time.  Psychiatric:        Mood and Affect: Mood normal.    Review of Systems  Psychiatric/Behavioral:  Positive for depression and substance abuse. The patient is nervous/anxious.   All other systems reviewed and are negative.  Blood pressure (!) 141/98, pulse 62, temperature 98.2 F (36.8 C), resp. rate 16, height 5\' 7"  (1.702 m), weight 87.1 kg, SpO2 94%. Body mass index is 30.07 kg/m.  Treatment Plan Summary: Daily contact with patient to assess and evaluate symptoms and progress in treatment, Medication management, and Plan : Major depressive disorder, recurrent, severe without psychosis: Started Lexapro 10 mg daily   Benzodiazepine withdrawal: Ativan detox protocol started   Insomnia: Trazodone 50 mg daily at bedtime PRN  Observation Level/Precautions:  15 minute checks  Laboratory:  Completed, reviewed, stable  Psychotherapy:  Individual and group therapy  Medications:  See above  Consultations:  None  Discharge Concerns:  None  Estimated LOS:  3-6 days  Other:     Physician Treatment Plan for Primary Diagnosis: Major depressive disorder, recurrent severe without psychotic features (HCC) Long Term Goal(s): Improvement in symptoms so as ready for discharge  Short Term Goals: Ability to identify changes in lifestyle to reduce recurrence of condition will improve, Ability to verbalize feelings will improve, Ability to disclose and discuss suicidal ideas, Ability to demonstrate self-control will improve, Ability to identify and develop effective coping behaviors will improve, Ability to maintain clinical measurements  within normal limits will improve, Compliance with prescribed medications will improve, and Ability to identify triggers associated with substance abuse/mental health issues will improve  Physician Treatment Plan for Secondary Diagnosis: Principal Problem:   Major depressive disorder, recurrent severe without psychotic features (HCC) Active Problems:    Benzodiazepine withdrawal (HCC)   Generalized anxiety disorder  Long Term Goal(s): Improvement in symptoms so as ready for discharge  Short Term Goals: Ability to identify changes in lifestyle to reduce recurrence of condition will improve, Ability to verbalize feelings will improve, Ability to disclose and discuss suicidal ideas, Ability to demonstrate self-control will improve, Ability to identify and develop effective coping behaviors will improve, Ability to maintain clinical measurements within normal limits will improve, Compliance with prescribed medications will improve, and Ability to identify triggers associated with substance abuse/mental health issues will improve  Major depressive disorder: Lexapro 10mg  Daily  Benzodiazepine Abuse: Ativan taper Protocol   Anxiety: Hydroxyzine 25mg , PRN TID   I certify that inpatient services furnished can reasonably be expected to improve the patient's condition.    Nanine Means, NP 11/21/20246:47 AM

## 2022-12-27 NOTE — Plan of Care (Signed)

## 2022-12-27 NOTE — Group Note (Signed)
Date:  12/27/2022 Time:  4:30 PM  Group Topic/Focus:  Activity Group:  The focus of the group is to promote activity outside in the courtyard to get some fresh air and some exercise.    Participation Level:  Did Not Attend   Mary Sella Emmalee Solivan 12/27/2022, 4:30 PM

## 2022-12-27 NOTE — Progress Notes (Signed)
   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 Denies  Danger to Others  Danger to Others None reported or observed

## 2022-12-27 NOTE — Group Note (Signed)
Date:  12/27/2022 Time:  9:41 PM  Group Topic/Focus:  Orientation:   The focus of this group is to educate the patient on the purpose and policies of crisis stabilization and provide a format to answer questions about their admission.  The group details unit policies and expectations of patients while admitted.    Participation Level:  Active  Participation Quality:  Appropriate and Attentive  Affect:  Appropriate  Cognitive:  Alert and Appropriate  Insight: Appropriate  Engagement in Group:  Developing/Improving  Modes of Intervention:  Clarification, Education, Orientation, and Support  Additional Comments:     Ronald [name] 12/27/2022, 9:41 PM

## 2022-12-28 DIAGNOSIS — F332 Major depressive disorder, recurrent severe without psychotic features: Secondary | ICD-10-CM | POA: Diagnosis not present

## 2022-12-28 MED ORDER — QUETIAPINE FUMARATE 25 MG PO TABS
25.0000 mg | ORAL_TABLET | Freq: Every day | ORAL | Status: DC
  Administered 2022-12-28: 25 mg via ORAL
  Filled 2022-12-28: qty 1

## 2022-12-28 NOTE — Progress Notes (Signed)
   12/28/22 0930  Psych Admission Type (Psych Patients Only)  Admission Status Voluntary  Psychosocial Assessment  Patient Complaints Anxiety;Depression;Hopelessness  Eye Contact Fair;Other (Comment) (patient states he slightly drowsy and will be going to lay back down after receiving medication)  Facial Expression Other (Comment) (appropriate)  Affect Appropriate to circumstance  Speech Logical/coherent  Interaction Assertive;Isolative (patient isolates to room, except for meals and medication)  Motor Activity Slow  Appearance/Hygiene In scrubs;Unremarkable  Behavior Characteristics Cooperative;Appropriate to situation  Mood Pleasant  Aggressive Behavior  Effect No apparent injury  Thought Process  Coherency Circumstantial  Content WDL  Delusions None reported or observed  Perception WDL  Hallucination None reported or observed  Judgment WDL  Confusion Severe  Danger to Self  Current suicidal ideation? Denies  Danger to Others  Danger to Others None reported or observed

## 2022-12-28 NOTE — [facility] Suicide Risk Assessment (Signed)
[facility] INPATIENT:  Family/Significant Other Suicide Prevention Education  Suicide Prevention Education:  Education Completed; Dewayne Hatch, mom, [phone], has been identified by the patient as the family member/significant other with whom the patient will be residing, and identified as the person(s) who will aid the patient in the event of a mental health crisis (suicidal ideations/suicide attempt).  With written consent from the patient, the family member/significant other has been provided the following suicide prevention education, prior to the and/or following the discharge of the patient.  The suicide prevention education provided includes the following: Suicide risk factors Suicide prevention and interventions National Suicide Hotline telephone number [facility] assessment telephone number [facility] Emergency Assistance 911 [facility] and/or Residential Mobile Crisis Unit telephone number  Request made of family/significant other to: Remove weapons (e.g., guns, rifles, knives), all items previously/currently identified as safety concern.   Remove drugs/medications (over-the-counter, prescriptions, illicit drugs), all items previously/currently identified as a safety concern.  The family member/significant other verbalizes understanding of the suicide prevention education information provided.  The family member/significant other agrees to remove the items of safety concern listed above.  Lowry Ram 12/28/2022, 2:21 PM

## 2022-12-28 NOTE — Group Note (Signed)
Date:  12/28/2022 Time:  11:04 PM  Group Topic/Focus:  Wrap-Up Group:   The focus of this group is to help patients review their daily goal of treatment and discuss progress on daily workbooks.    Participation Level:  Active  Participation Quality:  Appropriate  Affect:  Appropriate  Cognitive:  Appropriate  Insight: Appropriate  Engagement in Group:  Engaged  Modes of Intervention:  Discussion   Ronald [name] 12/28/2022, 11:04 PM

## 2022-12-28 NOTE — Plan of Care (Signed)

## 2022-12-28 NOTE — Progress Notes (Signed)
[facility] MD Progress Note  12/28/2022 4:18 PM Ronald [name].  MRN:  [id number]  Subjective:   Notes, vital signs, labs, and treatment team notes reviewed.  "Depression feels better, don't feel a whole lot", no suicidal ideations. "I feel pretty damn good". Anxiety is "great" right now on assessment, no panic attacks.  Appetite is "great".  Sleep was "really good, I think that was part of the problem".  Some sleepiness from his Seroquel, will reduce the amount to assist with this side effect.  His homicidal ideations are better also, discussed strategies to divert these obsessive thoughts. He played basketball yesterday and "that's the best I've felt in a long time."  Per his and his mother's request, this provider contacted his mother, Dewayne Hatch. She had concerns there is something wrong with his brain because of his behaviors prior to admission that have resolved, fortunately.  Other concerns around him losing his job and she does not feel he is going to be able to work again,  Museum/gallery curator provided and let her know the [facility] representative has met with him.  The work question would need to be addressed at an outpatient level.    Principal Problem: Major depressive disorder, recurrent severe without psychotic features (HCC) Diagnosis: Principal Problem:   Major depressive disorder, recurrent severe without psychotic features (HCC) Active Problems:   Benzodiazepine withdrawal (HCC)   Generalized anxiety disorder  Total Time spent with patient: 30 minutes  Past Psychiatric History: depression, anxiety  Past Medical History:  Past Medical History:  Diagnosis Date   Anxiety    Depression    Family history of adverse reaction to anesthesia    difficult to wake up - Mother   GERD (gastroesophageal reflux disease)    PTSD (post-traumatic stress disorder)     Past Surgical History:  Procedure Laterality Date   ELECTROCONVULSIVE THERAPY     FRACTURE SURGERY     wrist left   INSERTION  OF MESH  08/25/2021   Procedure: INSERTION OF MESH;  Surgeon: Sung Amabile, DO;  Location: [facility] ORS;  Service: General;;   UMBILICAL HERNIA REPAIR  08/25/2021   Procedure: HERNIA REPAIR UMBILICAL ADULT;  Surgeon: Sung Amabile, DO;  Location: [facility] ORS;  Service: General;;   Family History: History reviewed. No pertinent family history. Family Psychiatric  History: none Social History:  Social History   Substance and Sexual Activity  Alcohol Use Yes   Comment: occasional     Social History   Substance and Sexual Activity  Drug Use No    Social History   Socioeconomic History   Marital status: Single    Spouse name: Not on file   Number of children: Not on file   Years of education: Not on file   Highest education level: Not on file  Occupational History   Not on file  Tobacco Use   Smoking status: Every Day    Current packs/day: 1.00    Types: Cigarettes   Smokeless tobacco: Never  Vaping Use   Vaping status: Every Day  Substance and Sexual Activity   Alcohol use: Yes    Comment: occasional   Drug use: No   Sexual activity: Not on file  Other Topics Concern   Not on file  Social History Narrative   Not on file   Social Determinants of Health   Financial Resource Strain: Medium Risk (12/10/2022)   Received from [facility]   Overall Financial Resource Strain (CARDIA)    Difficulty of  Paying Living Expenses: Somewhat hard  Food Insecurity: No Food Insecurity (12/26/2022)   Hunger Vital Sign    Worried About Running Out of Food in the Last Year: Never true    Ran Out of Food in the Last Year: Never true  Transportation Needs: No Transportation Needs (12/26/2022)   PRAPARE - Administrator, Civil Service (Medical): No    Lack of Transportation (Non-Medical): No  Physical Activity: Not on file  Stress: Not on file  Social Connections: Not on file   Additional Social History: lives with his mother       Sleep: Good  Appetite:  Good  Current  Medications: Current Facility-Administered Medications  Medication Dose Route Frequency Provider Last Rate Last Admin   acetaminophen (TYLENOL) tablet 650 mg  650 mg Oral Q6H PRN Lauree Chandler, NP       alum & mag hydroxide-simeth (MAALOX/MYLANTA) 200-200-20 MG/5ML suspension 30 mL  30 mL Oral Q4H PRN Lauree Chandler, NP       amLODipine (NORVASC) tablet 5 mg  5 mg Oral Daily Charm Rings, NP   5 mg at 12/28/22 0857   haloperidol (HALDOL) tablet 5 mg  5 mg Oral TID PRN Lauree Chandler, NP   5 mg at 12/27/22 0900   And   diphenhydrAMINE (BENADRYL) capsule 50 mg  50 mg Oral TID PRN Lauree Chandler, NP   50 mg at 12/27/22 0900   escitalopram (LEXAPRO) tablet 10 mg  10 mg Oral Daily Charm Rings, NP   10 mg at 12/28/22 0857   hydrOXYzine (ATARAX) tablet 25 mg  25 mg Oral Q6H PRN Charm Rings, NP       loperamide (IMODIUM) capsule 2-4 mg  2-4 mg Oral PRN Lauree Chandler, NP       LORazepam (ATIVAN) tablet 1 mg  1 mg Oral Q6H PRN Charm Rings, NP       LORazepam (ATIVAN) tablet 1 mg  1 mg Oral TID Charm Rings, NP   1 mg at 12/28/22 1202   Followed by   Melene Muller ON 12/29/2022] LORazepam (ATIVAN) tablet 1 mg  1 mg Oral BID Charm Rings, NP       Followed by   Melene Muller ON 12/31/2022] LORazepam (ATIVAN) tablet 1 mg  1 mg Oral Daily Keiry Kowal Y, NP       magnesium hydroxide (MILK OF MAGNESIA) suspension 30 mL  30 mL Oral Daily PRN Lauree Chandler, NP       multivitamin with minerals tablet 1 tablet  1 tablet Oral Daily Charm Rings, NP   1 tablet at 12/28/22 0857   nicotine (NICODERM CQ - dosed in mg/24 hours) patch 21 mg  21 mg Transdermal Daily Charm Rings, NP   21 mg at 12/27/22 1252   ondansetron (ZOFRAN-ODT) disintegrating tablet 4 mg  4 mg Oral Q6H PRN Lauree Chandler, NP       QUEtiapine (SEROQUEL) tablet 50 mg  50 mg Oral QHS Charm Rings, NP   50 mg at 12/27/22 2054   thiamine (VITAMIN B1) tablet 100 mg  100 mg Oral Daily Charm Rings, NP   100 mg at 12/28/22 0857   traZODone (DESYREL) tablet 50 mg  50 mg Oral QHS PRN Lauree Chandler, NP        Lab Results: No results found for this or any previous visit (from the past 48 hour(s)).  Blood  Alcohol level:  Lab Results  Component Value Date   ETH <10 12/26/2022   ETH 196 (H) 09/08/2018    Metabolic Disorder Labs: No results found for: "HGBA1C", "MPG" No results found for: "PROLACTIN" No results found for: "CHOL", "TRIG", "HDL", "CHOLHDL", "VLDL", "LDLCALC"  Physical Findings: AIMS:  , ,  ,  ,    CIWA:  CIWA-Ar Total: 0 COWS:     Musculoskeletal: Strength & Muscle Tone: within normal limits Gait & Station: normal Patient leans: N/A  Psychiatric Specialty Exam: Physical Exam Vitals and nursing note reviewed.  Constitutional:      Appearance: Normal appearance.  HENT:     Head: Normocephalic.     Nose: Nose normal.  Pulmonary:     Effort: Pulmonary effort is normal.  Musculoskeletal:        General: Normal range of motion.     Cervical back: Normal range of motion.  Neurological:     General: No focal deficit present.     Mental Status: He is alert and oriented to person, place, and time.     Review of Systems  Psychiatric/Behavioral:  Positive for depression. The patient is nervous/anxious.   All other systems reviewed and are negative.   Blood pressure 124/88, pulse 66, temperature (!) 97.5 F (36.4 C), temperature source Oral, resp. rate 16, height 5\' 7"  (1.702 m), weight 87.1 kg, SpO2 93%.Body mass index is 30.07 kg/m.  General Appearance: Casual  Eye Contact:  Good  Speech:  Clear and Coherent  Volume:  Normal  Mood:  Anxious and Depressed  Affect:  Congruent  Thought Process:  Coherent  Orientation:  Full (Time, Place, and Person)  Thought Content:  WDL and Logical  Suicidal Thoughts:  No  Homicidal Thoughts:  Yes.  without intent/plan, occasionally  Memory:  Immediate;   Fair Recent;   Fair Remote;   Fair  Judgement:  Fair   Insight:  Fair  Psychomotor Activity:  Normal  Concentration:  Concentration: Fair and Attention Span: Fair  Recall:  Fair  Fund of Knowledge:  Good  Language:  Good  Akathisia:  No  Handed:  Right  AIMS (if indicated):     Assets:  Housing Leisure Time Physical Health Resilience Social Support  ADL's:  Intact  Cognition:  WNL  Sleep:         Physical Exam: Physical Exam Vitals and nursing note reviewed.  Constitutional:      Appearance: Normal appearance.  HENT:     Head: Normocephalic.     Nose: Nose normal.  Pulmonary:     Effort: Pulmonary effort is normal.  Musculoskeletal:        General: Normal range of motion.     Cervical back: Normal range of motion.  Neurological:     General: No focal deficit present.     Mental Status: He is alert and oriented to person, place, and time.    Review of Systems  Psychiatric/Behavioral:  Positive for depression. The patient is nervous/anxious.   All other systems reviewed and are negative.  Blood pressure 124/88, pulse 66, temperature (!) 97.5 F (36.4 C), temperature source Oral, resp. rate 16, height 5\' 7"  (1.702 m), weight 87.1 kg, SpO2 93%. Body mass index is 30.07 kg/m.   Treatment Plan Summary: Daily contact with patient to assess and evaluate symptoms and progress in treatment, Medication management, and Plan : Major depressive disorder, recurrent, severe without psychosis: Lexapro 10 mg daily   Benzodiazepine withdrawal: Ativan detox protocol started  Insomnia: Trazodone 50 mg daily at bedtime PRN Seroquel 50 mg daily at bedtime started yesterday afternoon per his request, decreased today to 25 mg daily.  TSH, A1C, and lipid panel ordered.  Nanine Means, NP 12/28/2022, 4:18 PM

## 2022-12-28 NOTE — Group Note (Signed)
Recreation Therapy Group Note   Group Topic:Leisure Education  Group Date: 12/28/2022 Start Time: 1000 End Time: 1100 Facilitators: Rosina Lowenstein, LRT, CTRS Location:  Craft Room  Group Description: Leisure. Patients were given the option to choose from singing karaoke, coloring mandalas, using oil pastels, journaling, or playing with play-doh. LRT and pts discussed the meaning of leisure, the importance of participating in leisure during their free time/when they're outside of the hospital, as well as how our leisure interests can also serve as coping skills.   Goal Area(s) Addressed:  Patient will identify a current leisure interest.  Patient will learn the definition of "leisure". Patient will practice making a positive decision. Patient will have the opportunity to try a new leisure activity. Patient will communicate with peers and LRT.    Affect/Mood: N/A   Participation Level: Did not attend    Clinical Observations/Individualized Feedback: Ronald [name] did not attend group.   Plan: Continue to engage patient in RT group sessions 2-3x/week.   Rosina Lowenstein, LRT, CTRS 12/28/2022 11:54 AM

## 2022-12-28 NOTE — Group Note (Signed)
Date:  12/28/2022 Time:  6:47 PM  Group Topic/Focus:  Activity Group:  The focus of the group is to promote activity for the patients to receive some fresh air and get some exercise out in the courtyard.    Participation Level:  Did Not Attend   Mary Sella Shakesha Soltau 12/28/2022, 6:47 PM

## 2022-12-28 NOTE — Progress Notes (Signed)
Patient was found in bed, sleeping, showing no signs of acute distress. Patient remains safe on the unit and will continue to be monitored.    12/28/22 1200  CIWA-Ar  Nausea and Vomiting 0  Tactile Disturbances 0  Tremor 0  Auditory Disturbances 0  Paroxysmal Sweats 0  Visual Disturbances 0  Anxiety 0  Headache, Fullness in Head 0  Agitation 0  Orientation and Clouding of Sensorium 0  CIWA-Ar Total 0

## 2022-12-28 NOTE — [facility] IP Treatment Plan (Signed)
Interdisciplinary Treatment and Diagnostic Plan Update  12/28/2022 Time of Session: 9:49AM Ronald [name]. MRN: [id number]  Principal Diagnosis: Major depressive disorder, recurrent severe without psychotic features (HCC)  Secondary Diagnoses: Principal Problem:   Major depressive disorder, recurrent severe without psychotic features (HCC) Active Problems:   Benzodiazepine withdrawal (HCC)   Generalized anxiety disorder   Current Medications:  Current Facility-Administered Medications  Medication Dose Route Frequency Provider Last Rate Last Admin   acetaminophen (TYLENOL) tablet 650 mg  650 mg Oral Q6H PRN Lauree Chandler, NP       alum & mag hydroxide-simeth (MAALOX/MYLANTA) 200-200-20 MG/5ML suspension 30 mL  30 mL Oral Q4H PRN Lauree Chandler, NP       amLODipine (NORVASC) tablet 5 mg  5 mg Oral Daily Charm Rings, NP   5 mg at 12/28/22 0857   haloperidol (HALDOL) tablet 5 mg  5 mg Oral TID PRN Lauree Chandler, NP   5 mg at 12/27/22 0900   And   diphenhydrAMINE (BENADRYL) capsule 50 mg  50 mg Oral TID PRN Lauree Chandler, NP   50 mg at 12/27/22 0900   escitalopram (LEXAPRO) tablet 10 mg  10 mg Oral Daily Charm Rings, NP   10 mg at 12/28/22 0857   hydrOXYzine (ATARAX) tablet 25 mg  25 mg Oral Q6H PRN Charm Rings, NP       loperamide (IMODIUM) capsule 2-4 mg  2-4 mg Oral PRN Lauree Chandler, NP       LORazepam (ATIVAN) tablet 1 mg  1 mg Oral Q6H PRN Charm Rings, NP       LORazepam (ATIVAN) tablet 1 mg  1 mg Oral TID Charm Rings, NP       Followed by   Melene Muller ON 12/29/2022] LORazepam (ATIVAN) tablet 1 mg  1 mg Oral BID Charm Rings, NP       Followed by   Melene Muller ON 12/31/2022] LORazepam (ATIVAN) tablet 1 mg  1 mg Oral Daily Charm Rings, NP       magnesium hydroxide (MILK OF MAGNESIA) suspension 30 mL  30 mL Oral Daily PRN Lauree Chandler, NP       multivitamin with minerals tablet 1 tablet  1 tablet Oral Daily Charm Rings, NP    1 tablet at 12/28/22 0857   nicotine (NICODERM CQ - dosed in mg/24 hours) patch 21 mg  21 mg Transdermal Daily Charm Rings, NP   21 mg at 12/27/22 1252   ondansetron (ZOFRAN-ODT) disintegrating tablet 4 mg  4 mg Oral Q6H PRN Lauree Chandler, NP       QUEtiapine (SEROQUEL) tablet 50 mg  50 mg Oral QHS Charm Rings, NP   50 mg at 12/27/22 2054   thiamine (VITAMIN B1) tablet 100 mg  100 mg Oral Daily Charm Rings, NP   100 mg at 12/28/22 0857   traZODone (DESYREL) tablet 50 mg  50 mg Oral QHS PRN Lauree Chandler, NP       PTA Medications: Medications Prior to Admission  Medication Sig Dispense Refill Last Dose   acetaminophen (TYLENOL) 325 MG tablet Take 2 tablets (650 mg total) by mouth every 8 (eight) hours as needed for up to 30 doses for mild pain, moderate pain, fever or headache. (Patient not taking: Reported on 12/26/2022) 30 tablet 0    ALPRAZolam (XANAX) 0.5 MG tablet Take 1 mg by mouth 3 (three) times daily as  needed. (Patient not taking: Reported on 12/26/2022)      amLODipine (NORVASC) 5 MG tablet Take 1 tablet (5 mg total) by mouth daily. 30 tablet 2    clonazePAM (KLONOPIN) 0.5 MG tablet Take 0.5 mg by mouth 2 (two) times daily as needed for anxiety.      famotidine (PEPCID) 20 MG tablet Take 20 mg by mouth 2 (two) times daily as needed for heartburn or indigestion.      HYDROcodone-acetaminophen (NORCO/VICODIN) 5-325 MG tablet Take 1 tablet by mouth every 8 (eight) hours as needed for up to 6 doses for severe pain (not relieved by other meds). (Patient not taking: Reported on 12/26/2022) 6 tablet 0    hydrOXYzine (ATARAX) 50 MG tablet Take 1 tablet by mouth 3 times daily as needed. 90 tablet 0    hydrOXYzine (VISTARIL) 50 MG capsule Take 1 capsule (50 mg total) by mouth 3 (three) times daily as needed. (Patient not taking: Reported on 12/26/2022) 30 capsule 0    ibuprofen (ADVIL) 800 MG tablet Take 1 tablet (800 mg total) by mouth every 8 (eight) hours as needed for up  to 30 doses for mild pain or moderate pain. (Patient not taking: Reported on 12/26/2022) 30 tablet 0    meloxicam (MOBIC) 15 MG tablet Take 1 tablet (15 mg total) by mouth daily. (Patient not taking: Reported on 12/26/2022) 30 tablet 1    methylPREDNISolone (MEDROL DOSEPAK) 4 MG TBPK tablet 6 day dose pack - take as directed (Patient not taking: Reported on 12/26/2022) 21 tablet 0    nicotine (NICODERM CQ - DOSED IN MG/24 HOURS) 14 mg/24hr patch Place 14 mg onto the skin every 14 (fourteen) days.      nicotine polacrilex (NICORETTE) 4 MG gum Place 4 mg inside cheek as needed for smoking cessation.      sertraline (ZOLOFT) 50 MG tablet Take 1 tablet by mouth daily.       Patient Stressors:    Patient Strengths:    Treatment Modalities: Medication Management, Group therapy, Case management,  1 to 1 session with clinician, Psychoeducation, Recreational therapy.   Physician Treatment Plan for Primary Diagnosis: Major depressive disorder, recurrent severe without psychotic features (HCC) Long Term Goal(s): Improvement in symptoms so as ready for discharge   Short Term Goals: Ability to identify changes in lifestyle to reduce recurrence of condition will improve Ability to verbalize feelings will improve Ability to disclose and discuss suicidal ideas Ability to demonstrate self-control will improve Ability to identify and develop effective coping behaviors will improve Ability to maintain clinical measurements within normal limits will improve Compliance with prescribed medications will improve Ability to identify triggers associated with substance abuse/mental health issues will improve  Medication Management: Evaluate patient's response, side effects, and tolerance of medication regimen.  Therapeutic Interventions: 1 to 1 sessions, Unit Group sessions and Medication administration.  Evaluation of Outcomes: Not Met  Physician Treatment Plan for Secondary Diagnosis: Principal Problem:    Major depressive disorder, recurrent severe without psychotic features (HCC) Active Problems:   Benzodiazepine withdrawal (HCC)   Generalized anxiety disorder  Long Term Goal(s): Improvement in symptoms so as ready for discharge   Short Term Goals: Ability to identify changes in lifestyle to reduce recurrence of condition will improve Ability to verbalize feelings will improve Ability to disclose and discuss suicidal ideas Ability to demonstrate self-control will improve Ability to identify and develop effective coping behaviors will improve Ability to maintain clinical measurements within normal limits will improve Compliance with  prescribed medications will improve Ability to identify triggers associated with substance abuse/mental health issues will improve     Medication Management: Evaluate patient's response, side effects, and tolerance of medication regimen.  Therapeutic Interventions: 1 to 1 sessions, Unit Group sessions and Medication administration.  Evaluation of Outcomes: Not Met   RN Treatment Plan for Primary Diagnosis: Major depressive disorder, recurrent severe without psychotic features (HCC) Long Term Goal(s): Knowledge of disease and therapeutic regimen to maintain health will improve  Short Term Goals: Ability to demonstrate self-control, Ability to participate in decision making will improve, Ability to verbalize feelings will improve, Ability to disclose and discuss suicidal ideas, Ability to identify and develop effective coping behaviors will improve, and Compliance with prescribed medications will improve  Medication Management: RN will administer medications as ordered by provider, will assess and evaluate patient's response and provide education to patient for prescribed medication. RN will report any adverse and/or side effects to prescribing provider.  Therapeutic Interventions: 1 on 1 counseling sessions, Psychoeducation, Medication administration, Evaluate  responses to treatment, Monitor vital signs and CBGs as ordered, Perform/monitor CIWA, COWS, AIMS and Fall Risk screenings as ordered, Perform wound care treatments as ordered.  Evaluation of Outcomes: Not Met   LCSW Treatment Plan for Primary Diagnosis: Major depressive disorder, recurrent severe without psychotic features (HCC) Long Term Goal(s): Safe transition to appropriate next level of care at discharge, Engage patient in therapeutic group addressing interpersonal concerns.  Short Term Goals: Engage patient in aftercare planning with referrals and resources, Increase social support, Increase ability to appropriately verbalize feelings, Increase emotional regulation, Facilitate acceptance of mental health diagnosis and concerns, Facilitate patient progression through stages of change regarding substance use diagnoses and concerns, Identify triggers associated with mental health/substance abuse issues, and Increase skills for wellness and recovery  Therapeutic Interventions: Assess for all discharge needs, 1 to 1 time with Social worker, Explore available resources and support systems, Assess for adequacy in community support network, Educate family and significant other(s) on suicide prevention, Complete Psychosocial Assessment, Interpersonal group therapy.  Evaluation of Outcomes: Not Met   Progress in Treatment: Attending groups: No. Participating in groups: No. Taking medication as prescribed: Yes. Toleration medication: Yes. Family/Significant other contact made: No, will contact:  once permission has been granted. Patient understands diagnosis: Yes. Discussing patient identified problems/goals with staff: Yes. Medical problems stabilized or resolved: Yes. Denies suicidal/homicidal ideation: No. Issues/concerns per patient self-inventory: No. Other: none  New problem(s) identified: No, Describe:  none  New Short Term/Long Term Goal(s): detox, elimination of symptoms of  psychosis, medication management for mood stabilization; elimination of SI thoughts; development of comprehensive mental wellness/sobriety plan.   Patient Goals:  "I don't want to hurt anybody"  Discharge Plan or Barriers: CSW to assist in the development of appropriate discharge plans.   Reason for Continuation of Hospitalization: Anxiety Depression Medication stabilization Suicidal ideation  Estimated Length of Stay:  1-7 days  Last 3 [place] Suicide Severity Risk Score: Flowsheet Row Admission (Current) from 12/26/2022 in [facility] INPATIENT BEHAV[facility]AL MEDICINE Most recent reading at 12/26/2022  9:00 PM ED from 12/26/2022 in [facility] Emergency Department at [facility] Most recent reading at 12/26/2022  1:41 AM ED from 12/04/2022 in [facility] Emergency Department at [facility] Most recent reading at 12/04/2022  2:21 PM  C-SSRS RISK CATEGORY No Risk No Risk No Risk       Last PHQ 2/9 Scores:     No data to display  Scribe for Treatment Team: Harden Mo, Alexander Mt 12/28/2022 11:54 AM

## 2022-12-29 DIAGNOSIS — F332 Major depressive disorder, recurrent severe without psychotic features: Secondary | ICD-10-CM | POA: Diagnosis not present

## 2022-12-29 MED ORDER — QUETIAPINE FUMARATE 25 MG PO TABS
50.0000 mg | ORAL_TABLET | Freq: Every day | ORAL | Status: DC
  Administered 2022-12-29 – 2022-12-31 (×3): 50 mg via ORAL
  Filled 2022-12-29 (×3): qty 2

## 2022-12-29 MED ORDER — NICOTINE POLACRILEX 2 MG MT GUM
2.0000 mg | CHEWING_GUM | OROMUCOSAL | Status: DC | PRN
  Administered 2022-12-29 – 2023-01-06 (×16): 2 mg via ORAL
  Filled 2022-12-29 (×17): qty 1

## 2022-12-29 NOTE — Plan of Care (Signed)
  Problem: Education: Goal: Emotional status will improve Outcome: Progressing   Problem: Education: Goal: Mental status will improve Outcome: Progressing   

## 2022-12-29 NOTE — Group Note (Signed)
Date:  12/29/2022 Time:  11:50 AM  Group Topic/Focus:  Goals Group:   The focus of this group is to help patients establish daily goals to achieve during treatment and discuss how the patient can incorporate goal setting into their daily lives to aide in recovery. Making Healthy Choices:   The focus of this group is to help patients identify negative/unhealthy choices they were using prior to admission and identify positive/healthier coping strategies to replace them upon discharge.    Participation Level:  Did Not Attend   Lynelle Smoke [facility] 12/29/2022, 11:50 AM

## 2022-12-29 NOTE — Progress Notes (Signed)
[facility] MD Progress Note  12/29/2022 9:48 AM Ronald [name].  MRN:  [id number]  Subjective:   Notes, vital signs, labs, and treatment team notes reviewed.  His depression is "much better", low level, no suicidal ideations.  Anxiety is "ok, mild", no panic attacks.  Appetite is better.  Sleep "took forever to go to sleep, need the medicine to go back to what it was", decreased yesterday as he slept too much.  His homicidal ideations are gone, now "My mind is stuck on getting a cigarette", nicotine replacement in place.  Principal Problem: Major depressive disorder, recurrent severe without psychotic features (HCC) Diagnosis: Principal Problem:   Major depressive disorder, recurrent severe without psychotic features (HCC) Active Problems:   Benzodiazepine withdrawal (HCC)   Generalized anxiety disorder  Total Time spent with patient: 30 minutes  Past Psychiatric History: depression, anxiety  Past Medical History:  Past Medical History:  Diagnosis Date   Anxiety    Depression    Family history of adverse reaction to anesthesia    difficult to wake up - Mother   GERD (gastroesophageal reflux disease)    PTSD (post-traumatic stress disorder)     Past Surgical History:  Procedure Laterality Date   ELECTROCONVULSIVE THERAPY     FRACTURE SURGERY     wrist left   INSERTION OF MESH  08/25/2021   Procedure: INSERTION OF MESH;  Surgeon: Sung Amabile, DO;  Location: [facility] ORS;  Service: General;;   UMBILICAL HERNIA REPAIR  08/25/2021   Procedure: HERNIA REPAIR UMBILICAL ADULT;  Surgeon: Sung Amabile, DO;  Location: [facility] ORS;  Service: General;;   Family History: History reviewed. No pertinent family history. Family Psychiatric  History: none Social History:  Social History   Substance and Sexual Activity  Alcohol Use Yes   Comment: occasional     Social History   Substance and Sexual Activity  Drug Use No    Social History   Socioeconomic History   Marital status: Single     Spouse name: Not on file   Number of children: Not on file   Years of education: Not on file   Highest education level: Not on file  Occupational History   Not on file  Tobacco Use   Smoking status: Every Day    Current packs/day: 1.00    Types: Cigarettes   Smokeless tobacco: Never  Vaping Use   Vaping status: Every Day  Substance and Sexual Activity   Alcohol use: Yes    Comment: occasional   Drug use: No   Sexual activity: Not on file  Other Topics Concern   Not on file  Social History Narrative   Not on file   Social Determinants of Health   Financial Resource Strain: Medium Risk (12/10/2022)   Received from [facility]   Overall Financial Resource Strain (CARDIA)    Difficulty of Paying Living Expenses: Somewhat hard  Food Insecurity: No Food Insecurity (12/26/2022)   Hunger Vital Sign    Worried About Running Out of Food in the Last Year: Never true    Ran Out of Food in the Last Year: Never true  Transportation Needs: No Transportation Needs (12/26/2022)   PRAPARE - Administrator, Civil Service (Medical): No    Lack of Transportation (Non-Medical): No  Physical Activity: Not on file  Stress: Not on file  Social Connections: Not on file   Additional Social History: lives with his mother       Sleep: Good  Appetite:  Good  Current Medications: Current Facility-Administered Medications  Medication Dose Route Frequency Provider Last Rate Last Admin   acetaminophen (TYLENOL) tablet 650 mg  650 mg Oral Q6H PRN Lauree Chandler, NP       alum & mag hydroxide-simeth (MAALOX/MYLANTA) 200-200-20 MG/5ML suspension 30 mL  30 mL Oral Q4H PRN Lauree Chandler, NP       amLODipine (NORVASC) tablet 5 mg  5 mg Oral Daily Charm Rings, NP   5 mg at 12/29/22 0820   haloperidol (HALDOL) tablet 5 mg  5 mg Oral TID PRN Lauree Chandler, NP   5 mg at 12/27/22 0900   And   diphenhydrAMINE (BENADRYL) capsule 50 mg  50 mg Oral TID PRN Lauree Chandler, NP   50 mg at 12/27/22 0900   escitalopram (LEXAPRO) tablet 10 mg  10 mg Oral Daily Charm Rings, NP   10 mg at 12/29/22 0820   hydrOXYzine (ATARAX) tablet 25 mg  25 mg Oral Q6H PRN Charm Rings, NP       loperamide (IMODIUM) capsule 2-4 mg  2-4 mg Oral PRN Lauree Chandler, NP       LORazepam (ATIVAN) tablet 1 mg  1 mg Oral Q6H PRN Charm Rings, NP       LORazepam (ATIVAN) tablet 1 mg  1 mg Oral BID Charm Rings, NP       Followed by   Melene Muller ON 12/31/2022] LORazepam (ATIVAN) tablet 1 mg  1 mg Oral Daily Orly Quimby Y, NP       magnesium hydroxide (MILK OF MAGNESIA) suspension 30 mL  30 mL Oral Daily PRN Lauree Chandler, NP       multivitamin with minerals tablet 1 tablet  1 tablet Oral Daily Charm Rings, NP   1 tablet at 12/29/22 0820   nicotine (NICODERM CQ - dosed in mg/24 hours) patch 21 mg  21 mg Transdermal Daily Charm Rings, NP   21 mg at 12/29/22 0818   ondansetron (ZOFRAN-ODT) disintegrating tablet 4 mg  4 mg Oral Q6H PRN Lauree Chandler, NP       QUEtiapine (SEROQUEL) tablet 25 mg  25 mg Oral QHS Charm Rings, NP   25 mg at 12/28/22 2104   thiamine (VITAMIN B1) tablet 100 mg  100 mg Oral Daily Charm Rings, NP   100 mg at 12/29/22 0820   traZODone (DESYREL) tablet 50 mg  50 mg Oral QHS PRN Lauree Chandler, NP        Lab Results: No results found for this or any previous visit (from the past 48 hour(s)).  Blood Alcohol level:  Lab Results  Component Value Date   ETH <10 12/26/2022   ETH 196 (H) 09/08/2018    Metabolic Disorder Labs: No results found for: "HGBA1C", "MPG" No results found for: "PROLACTIN" No results found for: "CHOL", "TRIG", "HDL", "CHOLHDL", "VLDL", "LDLCALC"  Physical Findings: AIMS:  , ,  ,  ,    CIWA:  CIWA-Ar Total: 0 COWS:     Musculoskeletal: Strength & Muscle Tone: within normal limits Gait & Station: normal Patient leans: N/A  Psychiatric Specialty Exam: Physical Exam Vitals and nursing note  reviewed.  Constitutional:      Appearance: Normal appearance.  HENT:     Head: Normocephalic.     Nose: Nose normal.  Pulmonary:     Effort: Pulmonary effort is normal.  Musculoskeletal:  General: Normal range of motion.     Cervical back: Normal range of motion.  Neurological:     General: No focal deficit present.     Mental Status: He is alert and oriented to person, place, and time.     Review of Systems  Psychiatric/Behavioral:  Positive for depression. The patient is nervous/anxious.   All other systems reviewed and are negative.   Blood pressure (!) 138/95, pulse 72, temperature 98 F (36.7 C), temperature source Oral, resp. rate 16, height 5\' 7"  (1.702 m), weight 87.1 kg, SpO2 94%.Body mass index is 30.07 kg/m.  General Appearance: Casual  Eye Contact:  Good  Speech:  Clear and Coherent  Volume:  Normal  Mood:  Anxious and Depressed  Affect:  Congruent  Thought Process:  Coherent  Orientation:  Full (Time, Place, and Person)  Thought Content:  WDL and Logical  Suicidal Thoughts:  No  Homicidal Thoughts:  Yes.  without intent/plan, occasionally  Memory:  Immediate;   Fair Recent;   Fair Remote;   Fair  Judgement:  Fair  Insight:  Fair  Psychomotor Activity:  Normal  Concentration:  Concentration: Fair and Attention Span: Fair  Recall:  Fair  Fund of Knowledge:  Good  Language:  Good  Akathisia:  No  Handed:  Right  AIMS (if indicated):     Assets:  Housing Leisure Time Physical Health Resilience Social Support  ADL's:  Intact  Cognition:  WNL  Sleep:         Physical Exam: Physical Exam Vitals and nursing note reviewed.  Constitutional:      Appearance: Normal appearance.  HENT:     Head: Normocephalic.     Nose: Nose normal.  Pulmonary:     Effort: Pulmonary effort is normal.  Musculoskeletal:        General: Normal range of motion.     Cervical back: Normal range of motion.  Neurological:     General: No focal deficit present.      Mental Status: He is alert and oriented to person, place, and time.    Review of Systems  Psychiatric/Behavioral:  Positive for depression. The patient is nervous/anxious.   All other systems reviewed and are negative.  Blood pressure (!) 138/95, pulse 72, temperature 98 F (36.7 C), temperature source Oral, resp. rate 16, height 5\' 7"  (1.702 m), weight 87.1 kg, SpO2 94%. Body mass index is 30.07 kg/m.   Treatment Plan Summary: Daily contact with patient to assess and evaluate symptoms and progress in treatment, Medication management, and Plan : Major depressive disorder, recurrent, severe without psychosis: Lexapro 10 mg daily   Benzodiazepine withdrawal: Ativan detox protocol    Insomnia: Trazodone 50 mg daily at bedtime PRN Seroquel 50 mg daily at bedtime, increased from 25 mg  TSH, A1C, and lipid panel ordered.  Nanine Means, NP 12/29/2022, 9:48 AM

## 2022-12-29 NOTE — Progress Notes (Signed)
Patient states he did not sleep so well last night and requested his seroquel to be increased back to 50mg . Provider aware. Patient denies SI,HI, and A/V/H with no plan or intent and states depression is a 7 out of 10, anxiety 5 out of 10. Patient claims he is still having some night sweats and feels very anxious. Patient received hydroxyzine po prn inbetween his scheduled ativan. Patient states his goal for today is "controlling my anxiety and my mind." He claims he will do this by " listening to my peers and doctors and trusting them." No major s/s of withdrawal or distress.

## 2022-12-29 NOTE — Progress Notes (Signed)
Nursing Shift Note:  1900-0700  Attended Evening Group: No Medication Compliant:  Yes Behavior: anxious; cooperative Sleep Quality: good Significant Changes: none noted.     12/29/22 2300  Psych Admission Type (Psych Patients Only)  Admission Status Voluntary  Psychosocial Assessment  Patient Complaints Anxiety  Eye Contact Darting  Facial Expression Flat  Affect Appropriate to circumstance  Speech Logical/coherent  Interaction Assertive  Motor Activity Slow  Appearance/Hygiene Unremarkable  Behavior Characteristics Cooperative  Mood Anxious  Thought Process  Coherency WDL  Content WDL  Delusions None reported or observed  Perception WDL  Hallucination None reported or observed  Judgment Limited  Confusion None  Danger to Self  Current suicidal ideation? Denies  Danger to Others  Danger to Others None reported or observed

## 2022-12-29 NOTE — Group Note (Signed)
Date:  12/29/2022 Time:  4:05 PM  Group Topic/Focus:  Wellness Toolbox:   The focus of this group is to discuss various aspects of wellness, balancing those aspects and exploring ways to increase the ability to experience wellness.  Patients will create Ronald wellness toolbox for use upon discharge. Outdoor Recreation Activity    Participation Level:  Active  Participation Quality:  Appropriate  Affect:  Appropriate  Cognitive:  Appropriate  Insight: Appropriate  Engagement in Group:  Engaged  Modes of Intervention:  Activity  Additional Comments:    Ronald [name] Ronald [name] 12/29/2022, 4:05 PM

## 2022-12-29 NOTE — Plan of Care (Signed)

## 2022-12-29 NOTE — Plan of Care (Signed)
  Problem: Education: Goal: Verbalization of understanding the information provided will improve Outcome: Progressing   Problem: Education: Goal: Mental status will improve Outcome: Progressing

## 2022-12-29 NOTE — Group Note (Signed)
[facility] LCSW Group Therapy Note   Group Date: 12/29/2022 Start Time: 1315 End Time: 1350   Type of Therapy/Topic:  Group Therapy:  Balance in Life  Participation Level:  Active   Description of Group:    This group will address the concept of balance and how it feels and looks when one is unbalanced. Patients will be encouraged to process areas in their lives that are out of balance, and identify reasons for remaining unbalanced. Facilitators will guide patients utilizing problem- solving interventions to address and correct the stressor making their life unbalanced. Understanding and applying boundaries will be explored and addressed for obtaining  and maintaining a balanced life. Patients will be encouraged to explore ways to assertively make their unbalanced needs known to significant others in their lives, using other group members and facilitator for support and feedback.  Therapeutic Goals: Patient will identify two or more emotions or situations they have that consume much of in their lives. Patient will identify signs/triggers that life has become out of balance:  Patient will identify two ways to set boundaries in order to achieve balance in their lives:  Patient will demonstrate ability to communicate their needs through discussion and/or role plays  Summary of Patient Progress:  The patient attended group. Patient proved open to input from peers and feedback from [facility]. The patient was respectful of peers and participated throughout the entire session. The patient participated during today's icebreaker question. The patient stated that he is learning to not give up so easily.        Marshell Levan, LCSW

## 2022-12-30 DIAGNOSIS — F332 Major depressive disorder, recurrent severe without psychotic features: Secondary | ICD-10-CM | POA: Diagnosis not present

## 2022-12-30 LAB — LIPID PANEL
Cholesterol: 145 mg/dL (ref 0–200)
HDL: 27 mg/dL — ABNORMAL LOW (ref >40)
LDL Cholesterol: 68 mg/dL (ref 0–99)
Total CHOL/HDL Ratio: 5.4 {ratio}
Triglycerides: 249 mg/dL — ABNORMAL HIGH (ref <150)
VLDL: 50 mg/dL — ABNORMAL HIGH (ref 0–40)

## 2022-12-30 LAB — HEMOGLOBIN A1C
Hgb A1c MFr Bld: 5.2 % (ref 4.8–5.6)
Mean Plasma Glucose: 102.54 mg/dL

## 2022-12-30 LAB — TSH: TSH: 1.892 u[IU]/mL (ref 0.350–4.500)

## 2022-12-30 MED ORDER — GABAPENTIN 300 MG PO CAPS
300.0000 mg | ORAL_CAPSULE | Freq: Three times a day (TID) | ORAL | Status: DC
  Administered 2022-12-30 – 2023-01-07 (×23): 300 mg via ORAL
  Filled 2022-12-30 (×23): qty 1

## 2022-12-30 MED ORDER — HYDROXYZINE HCL 25 MG PO TABS
25.0000 mg | ORAL_TABLET | Freq: Three times a day (TID) | ORAL | Status: DC | PRN
  Administered 2022-12-30: 25 mg via ORAL
  Filled 2022-12-30 (×2): qty 1

## 2022-12-30 NOTE — Progress Notes (Signed)
[facility] MD Progress Note  12/30/2022 9:25 AM Ronald [name].  MRN:  [id number]  Subjective:   Notes, vital signs, labs, and treatment team notes reviewed. "I woke up a lot during the night and experienced nightmares. I'm feeling better now although I woke up with racing thoughts." Anxiety is high, no panic attacks. No other withdrawal symptoms.  Appetite is making himself eat.  Tinnie Gens denies depression, hallucinations, delusions. He also denies suicidal or homicidal ideation. No side effects from his medications.  Principal Problem: Major depressive disorder, recurrent severe without psychotic features (HCC) Diagnosis: Principal Problem:   Major depressive disorder, recurrent severe without psychotic features (HCC) Active Problems:   Benzodiazepine withdrawal (HCC)   Generalized anxiety disorder  Total Time spent with patient: 30 minutes  Past Psychiatric History: depression, anxiety  Past Medical History:  Past Medical History:  Diagnosis Date   Anxiety    Depression    Family history of adverse reaction to anesthesia    difficult to wake up - Mother   GERD (gastroesophageal reflux disease)    PTSD (post-traumatic stress disorder)     Past Surgical History:  Procedure Laterality Date   ELECTROCONVULSIVE THERAPY     FRACTURE SURGERY     wrist left   INSERTION OF MESH  08/25/2021   Procedure: INSERTION OF MESH;  Surgeon: Sung Amabile, DO;  Location: [facility] ORS;  Service: General;;   UMBILICAL HERNIA REPAIR  08/25/2021   Procedure: HERNIA REPAIR UMBILICAL ADULT;  Surgeon: Sung Amabile, DO;  Location: [facility] ORS;  Service: General;;   Family History: History reviewed. No pertinent family history. Family Psychiatric  History: none Social History:  Social History   Substance and Sexual Activity  Alcohol Use Yes   Comment: occasional     Social History   Substance and Sexual Activity  Drug Use No    Social History   Socioeconomic History   Marital status: Single    Spouse  name: Not on file   Number of children: Not on file   Years of education: Not on file   Highest education level: Not on file  Occupational History   Not on file  Tobacco Use   Smoking status: Every Day    Current packs/day: 1.00    Types: Cigarettes   Smokeless tobacco: Never  Vaping Use   Vaping status: Every Day  Substance and Sexual Activity   Alcohol use: Yes    Comment: occasional   Drug use: No   Sexual activity: Not on file  Other Topics Concern   Not on file  Social History Narrative   Not on file   Social Determinants of Health   Financial Resource Strain: Medium Risk (12/10/2022)   Received from [facility]   Overall Financial Resource Strain (CARDIA)    Difficulty of Paying Living Expenses: Somewhat hard  Food Insecurity: No Food Insecurity (12/26/2022)   Hunger Vital Sign    Worried About Running Out of Food in the Last Year: Never true    Ran Out of Food in the Last Year: Never true  Transportation Needs: No Transportation Needs (12/26/2022)   PRAPARE - Administrator, Civil Service (Medical): No    Lack of Transportation (Non-Medical): No  Physical Activity: Not on file  Stress: Not on file  Social Connections: Not on file   Additional Social History: lives with his mother   Sleep: Fair  Appetite:  Good  Current Medications: Current Facility-Administered Medications  Medication Dose Route  Frequency Provider Last Rate Last Admin   acetaminophen (TYLENOL) tablet 650 mg  650 mg Oral Q6H PRN Lauree Chandler, NP       alum & mag hydroxide-simeth (MAALOX/MYLANTA) 200-200-20 MG/5ML suspension 30 mL  30 mL Oral Q4H PRN Lauree Chandler, NP   30 mL at 12/29/22 1702   amLODipine (NORVASC) tablet 5 mg  5 mg Oral Daily Charm Rings, NP   5 mg at 12/30/22 0825   haloperidol (HALDOL) tablet 5 mg  5 mg Oral TID PRN Lauree Chandler, NP   5 mg at 12/30/22 4098   And   diphenhydrAMINE (BENADRYL) capsule 50 mg  50 mg Oral TID PRN Lauree Chandler, NP   50 mg at 12/30/22 1191   escitalopram (LEXAPRO) tablet 10 mg  10 mg Oral Daily Charm Rings, NP   10 mg at 12/30/22 0825   hydrOXYzine (ATARAX) tablet 25 mg  25 mg Oral Q6H PRN Charm Rings, NP   25 mg at 12/30/22 0653   LORazepam (ATIVAN) tablet 1 mg  1 mg Oral Q6H PRN Charm Rings, NP       Melene Muller ON 12/31/2022] LORazepam (ATIVAN) tablet 1 mg  1 mg Oral Daily Charm Rings, NP       magnesium hydroxide (MILK OF MAGNESIA) suspension 30 mL  30 mL Oral Daily PRN Lauree Chandler, NP       multivitamin with minerals tablet 1 tablet  1 tablet Oral Daily Charm Rings, NP   1 tablet at 12/30/22 4782   nicotine (NICODERM CQ - dosed in mg/24 hours) patch 21 mg  21 mg Transdermal Daily Charm Rings, NP   21 mg at 12/29/22 0818   nicotine polacrilex (NICORETTE) gum 2 mg  2 mg Oral PRN Charm Rings, NP   2 mg at 12/29/22 2129   QUEtiapine (SEROQUEL) tablet 50 mg  50 mg Oral QHS Charm Rings, NP   50 mg at 12/29/22 2122   thiamine (VITAMIN B1) tablet 100 mg  100 mg Oral Daily Charm Rings, NP   100 mg at 12/30/22 0825   traZODone (DESYREL) tablet 50 mg  50 mg Oral QHS PRN Lauree Chandler, NP        Lab Results:  Results for orders placed or performed during the hospital encounter of 12/26/22 (from the past 48 hour(s))  Lipid panel     Status: Abnormal   Collection Time: 12/30/22  5:55 AM  Result Value Ref Range   Cholesterol 145 0 - 200 mg/dL   Triglycerides 956 (H) <150 mg/dL   HDL 27 (L) >21 mg/dL   Total CHOL/HDL Ratio 5.4 RATIO   VLDL 50 (H) 0 - 40 mg/dL   LDL Cholesterol 68 0 - 99 mg/dL    Comment:        Total Cholesterol/HDL:CHD Risk Coronary Heart Disease Risk Table                     Men   Women  1/2 Average Risk   3.4   3.3  Average Risk       5.0   4.4  2 X Average Risk   9.6   7.1  3 X Average Risk  23.4   11.0        Use the calculated Patient Ratio above and the CHD Risk Table to determine the patient's CHD Risk.  ATP III CLASSIFICATION (LDL):  <100     mg/dL   Optimal  119-147  mg/dL   Near or Above                    Optimal  130-159  mg/dL   Borderline  829-562  mg/dL   High  >130     mg/dL   Very High Performed at [facility], [address] [place], [place] [address]   Hemoglobin A1c     Status: None   Collection Time: 12/30/22  5:55 AM  Result Value Ref Range   Hgb A1c MFr Bld 5.2 4.8 - 5.6 %    Comment: (NOTE) Pre diabetes:          5.7%-6.4%  Diabetes:              >6.4%  Glycemic control for   <7.0% adults with diabetes    Mean Plasma Glucose 102.54 mg/dL    Comment: Performed at [facility], [address], [place], [place] [address]    Blood Alcohol level:  Lab Results  Component Value Date   ETH <10 12/26/2022   ETH 196 (H) 09/08/2018    Metabolic Disorder Labs: Lab Results  Component Value Date   HGBA1C 5.2 12/30/2022   MPG 102.54 12/30/2022   No results found for: "PROLACTIN" Lab Results  Component Value Date   CHOL 145 12/30/2022   TRIG 249 (H) 12/30/2022   HDL 27 (L) 12/30/2022   CHOLHDL 5.4 12/30/2022   VLDL 50 (H) 12/30/2022   LDLCALC 68 12/30/2022    Physical Findings: AIMS:  , ,  ,  ,    CIWA:  CIWA-Ar Total: 4 COWS:     Musculoskeletal: Strength & Muscle Tone: within normal limits Gait & Station: normal Patient leans: N/A  Psychiatric Specialty Exam: Physical Exam Vitals and nursing note reviewed.  Constitutional:      Appearance: Normal appearance.  HENT:     Head: Normocephalic.     Nose: Nose normal.  Pulmonary:     Effort: Pulmonary effort is normal.  Musculoskeletal:        General: Normal range of motion.     Cervical back: Normal range of motion.  Neurological:     General: No focal deficit present.     Mental Status: He is alert and oriented to person, place, and time.     Review of Systems  Psychiatric/Behavioral:  The patient is nervous/anxious.   All other systems reviewed and are negative.    Blood pressure (!) 144/97, pulse 69, temperature 98.3 F (36.8 C), temperature source Oral, resp. rate 16, height 5\' 7"  (1.702 m), weight 87.1 kg, SpO2 95%.Body mass index is 30.07 kg/m.  General Appearance: Casual  Eye Contact:  Good  Speech:  Clear and Coherent  Volume:  Normal  Mood:  Anxious  Affect:  Congruent  Thought Process:  Coherent  Orientation:  Full (Time, Place, and Person)  Thought Content:  WDL and Logical  Suicidal Thoughts:  No  Homicidal Thoughts:  Yes.  without intent/plan, occasionally  Memory:  Immediate;   Fair Recent;   Fair Remote;   Fair  Judgement:  Fair  Insight:  Fair  Psychomotor Activity:  Normal  Concentration:  Concentration: Fair and Attention Span: Fair  Recall:  [employer] of Knowledge:  Good  Language:  Good  Akathisia:  No  Handed:  Right  AIMS (if indicated):     Assets:  Housing Leisure Time Physical Health Resilience Social Support  ADL's:  Intact  Cognition:  WNL  Sleep:  Poor      Physical Exam: Physical Exam Vitals and nursing note reviewed.  Constitutional:      Appearance: Normal appearance.  HENT:     Head: Normocephalic.     Nose: Nose normal.  Pulmonary:     Effort: Pulmonary effort is normal.  Musculoskeletal:        General: Normal range of motion.     Cervical back: Normal range of motion.  Neurological:     General: No focal deficit present.     Mental Status: He is alert and oriented to person, place, and time.    Review of Systems  Psychiatric/Behavioral:  The patient is nervous/anxious.   All other systems reviewed and are negative.  Blood pressure (!) 144/97, pulse 69, temperature 98.3 F (36.8 C), temperature source Oral, resp. rate 16, height 5\' 7"  (1.702 m), weight 87.1 kg, SpO2 95%. Body mass index is 30.07 kg/m.   Treatment Plan Summary: Daily contact with patient to assess and evaluate symptoms and progress in treatment, Medication management, and Plan : Major depressive disorder,  recurrent, severe without psychosis: Lexapro 10 mg daily   Benzodiazepine withdrawal: Ativan detox protocol    Insomnia: Trazodone 50 mg daily at bedtime PRN Seroquel 50 mg daily at bedtime, increased from 25 mg  TSH ordered, A1C 5.2, and lipid panel WDL with HDL L at 27, triglycerides 249 H, and VLDL 50 H; EKG with no AT prolongation  Nanine Means, NP 12/30/2022, 9:25 AM

## 2022-12-30 NOTE — Group Note (Unsigned)
Date:  12/30/2022 Time:  2:26 AM  Group Topic/Focus:  Wrap-Up Group:   The focus of this group is to help patients review their daily goal of treatment and discuss progress on daily workbooks.     Participation Level:  {BHH PARTICIPATION ZOXWR:60454}  Participation Quality:  {BHH PARTICIPATION QUALITY:22265}  Affect:  {BHH AFFECT:22266}  Cognitive:  {BHH COGNITIVE:22267}  Insight: {BHH Insight2:20797}  Engagement in Group:  {BHH ENGAGEMENT IN UJWJX:91478}  Modes of Intervention:  {BHH MODES OF INTERVENTION:22269}  Additional Comments:  ***  Lenore Cordia 12/30/2022, 2:26 AM

## 2022-12-30 NOTE — Group Note (Signed)
Date:  12/30/2022 Time:  9:31 PM  Group Topic/Focus:  Building Self Esteem:   The Focus of this group is helping patients become aware of the effects of self-esteem on their lives, the things they and others do that enhance or undermine their self-esteem, seeing the relationship between their level of self-esteem and the choices they make and learning ways to enhance self-esteem.    Participation Level:  Did Not Attend  Participation Quality:   none  Affect:   none  Cognitive:   none  Insight: None  Engagement in Group:   none  Modes of Intervention:   none  Additional Comments:  none   Tyreke Kaeser 12/30/2022, 9:31 PM

## 2022-12-30 NOTE — Progress Notes (Addendum)
Patient is med compliant and denies SI,HI, and A/V/H with no plan or intent. Patient appeared very anxious this morning aware that it is his last dose of ativan today. Patient stated this made him concerned. Patient stated he had trouble sleeping last night and has had racing thoughts. Patient denied wanting to hurt anyone but stated he was feeling irritable and like a "rage" inside that was making him want to act out or explode. Patient was given benadryl and haldol po prn along with his scheduled ativan this morning as patient insisted he was afraid he would act out and not be able to control his racing thoughts. Patient has remained cooperative throughout day with no issues. No s/s of current distress.

## 2022-12-30 NOTE — Group Note (Signed)
Date:  12/30/2022 Time:  6:06 PM  Group Topic/Focus:  OUTDOOR RECREATION STRUCTURED ACTIVITY    Participation Level:  Did Not Attend  Orvill Coulthard 12/30/2022, 6:06 PM

## 2022-12-30 NOTE — Progress Notes (Signed)
Patient reporting increasing anxiety. NP aware and patient given scheduled  gabapentin and 2nd dose of hydroxyzine po prn. Patient's bp noted to be elevated. BP reassessed. BP still noted to be elevated. NP notified. Patient in no current distress.   BP (!) 153/101 (BP Location: Right Arm)   Pulse 92   Temp 97.7 F (36.5 C)   Resp 18   Ht 5\' 7"  (1.702 m)   Wt 87.1 kg   SpO2 97%   BMI 30.07 kg/m

## 2022-12-30 NOTE — Plan of Care (Signed)
  Problem: Education: Goal: Verbalization of understanding the information provided will improve Outcome: Progressing   Problem: Health Behavior/Discharge Planning: Goal: Compliance with treatment plan for underlying cause of condition will improve Outcome: Progressing   Problem: Safety: Goal: Periods of time without injury will increase Outcome: Progressing

## 2022-12-30 NOTE — Group Note (Signed)
Date:  12/30/2022 Time:  1:21 PM  Group Topic/Focus:  Goals Group:   The focus of this group is to help patients establish daily goals to achieve during treatment and discuss how the patient can incorporate goal setting into their daily lives to aide in recovery. Group community meeting    Participation Level:  Did Not Attend   Ronald [name] 12/30/2022, 1:21 PM

## 2022-12-31 DIAGNOSIS — F332 Major depressive disorder, recurrent severe without psychotic features: Secondary | ICD-10-CM | POA: Diagnosis not present

## 2022-12-31 MED ORDER — CLONIDINE HCL 0.1 MG PO TABS
0.1000 mg | ORAL_TABLET | Freq: Every day | ORAL | Status: DC
  Administered 2022-12-31 – 2023-01-01 (×2): 0.1 mg via ORAL
  Filled 2022-12-31 (×2): qty 1

## 2022-12-31 NOTE — Group Note (Signed)
Date:  12/31/2022 Time:  5:54 PM  Group Topic/Focus:  Healthy Communication:   The focus of this group is to discuss communication, barriers to communication, as well as healthy ways to communicate with others. Outdoor recreation structured group activity    Participation Level:  Active  Participation Quality:  Appropriate  Affect:  Appropriate  Cognitive:  Appropriate  Insight: Appropriate  Engagement in Group:  Developing/Improving and Engaged  Modes of Intervention:  Activity  Additional Comments:    Aveline Daus 12/31/2022, 5:54 PM

## 2022-12-31 NOTE — Group Note (Signed)
Date:  12/31/2022 Time:  5:47 PM  Group Topic/Focus:  Dimensions of Wellness:   The focus of this group is to introduce the topic of wellness and discuss the role each dimension of wellness plays in total health. Goals Group:   The focus of this group is to help patients establish daily goals to achieve during treatment and discuss how the patient can incorporate goal setting into their daily lives to aide in recovery.    Participation Level:  Did Not Attend  Ronald [name] 12/31/2022, 5:47 PM

## 2022-12-31 NOTE — Group Note (Signed)
Recreation Therapy Group Note   Group Topic:Coping Skills  Group Date: 12/31/2022 Start Time: 1000 End Time: 1100 Facilitators: Rosina Lowenstein, LRT, CTRS Location:  Craft Room  Group Description: Mind Map.  Patient was provided a blank template of a diagram with 32 blank boxes in a tiered system, branching from the center (similar to a bubble chart). LRT directed patients to label the middle of the diagram "Coping Skills". LRT and patients then came up with 8 different coping skills as examples. Pt were directed to record their coping skills in the 2nd tier boxes closest to the center.  Patients would then share their coping skills with the group as LRT wrote them out. LRT gave a handout of 99 different coping skills at the end of group.   Goal Area(s) Addressed: Patients will be able to define "coping skills". Patient will identify new coping skills.  Patient will increase communication.   Affect/Mood: N/A   Participation Level: Did not attend    Clinical Observations/Individualized Feedback: Hudsyn did not attend group.   Plan: Continue to engage patient in RT group sessions 2-3x/week.   Rosina Lowenstein, LRT, CTRS 12/31/2022 11:38 AM

## 2022-12-31 NOTE — Plan of Care (Signed)

## 2022-12-31 NOTE — Plan of Care (Signed)
Denies si,hi and avh. Patient received atrax for anxiety. Took scheduled pm medications. Problem: Education: Goal: Knowledge of [facility] Education information/materials will improve Outcome: Progressing Goal: Emotional status will improve Outcome: Progressing Goal: Mental status will improve Outcome: Progressing Goal: Verbalization of understanding the information provided will improve Outcome: Progressing   Problem: Activity: Goal: Interest or engagement in activities will improve Outcome: Progressing

## 2022-12-31 NOTE — Progress Notes (Signed)
D- Patient alert and oriented x4. Affect flat/mood irritable. Denies SI/ HI/ AVH. Patient denies pain. Patient endorses depression and anxiety. He states he "feels like hitting the wall". He does not know what is triggering him to feel this way, but maybe "coming off of the benzo's". A- Scheduled medications administered to patient, per MD orders. Support and encouragement provided.  Routine safety checks conducted every 15 minutes without incident.  Patient informed to notify staff with problems or concerns and verbalizes understanding. R- No adverse drug reactions noted.  Patient compliant with medications and treatment plan. Patient receptive and cooperative and interacts well with others on the unit.  Patient contracts for safety and  remains safe on the unit at this time.

## 2022-12-31 NOTE — Group Note (Signed)
LCSW Group Therapy Note  Group Date: 12/31/2022 Start Time: 1330 End Time: 1430   Type of Therapy and Topic:  Group Therapy - Healthy vs Unhealthy Coping Skills  Participation Level:  Did Not Attend   Description of Group The focus of this group was to determine what unhealthy coping techniques typically are used by group members and what healthy coping techniques would be helpful in coping with various problems. Patients were guided in becoming aware of the differences between healthy and unhealthy coping techniques. Patients were asked to identify 2-3 healthy coping skills they would like to learn to use more effectively.  Therapeutic Goals Patients learned that coping is what human beings do all day long to deal with various situations in their lives Patients defined and discussed healthy vs unhealthy coping techniques Patients identified their preferred coping techniques and identified whether these were healthy or unhealthy Patients determined 2-3 healthy coping skills they would like to become more familiar with and use more often. Patients provided support and ideas to each other   Summary of Patient Progress:   X  Therapeutic Modalities Cognitive Behavioral Therapy Motivational Interviewing  Harden Mo, Theresia Majors 12/31/2022  3:04 PM

## 2022-12-31 NOTE — Group Note (Signed)
Date:  12/31/2022 Time:  10:08 PM  Group Topic/Focus:  Wrap-Up Group:   The focus of this group is to help patients review their daily goal of treatment and discuss progress on daily workbooks.    Participation Level:  Did Not Attend   Maglione,Clodagh Odenthal E 12/31/2022, 10:08 PM

## 2023-01-01 DIAGNOSIS — F332 Major depressive disorder, recurrent severe without psychotic features: Secondary | ICD-10-CM | POA: Diagnosis not present

## 2023-01-01 MED ORDER — PROPRANOLOL HCL 20 MG PO TABS
10.0000 mg | ORAL_TABLET | Freq: Every day | ORAL | Status: DC
  Administered 2023-01-01 – 2023-01-07 (×7): 10 mg via ORAL
  Filled 2023-01-01 (×7): qty 1

## 2023-01-01 MED ORDER — HYDROXYZINE HCL 50 MG PO TABS
50.0000 mg | ORAL_TABLET | Freq: Three times a day (TID) | ORAL | Status: DC | PRN
  Administered 2023-01-01 – 2023-01-06 (×14): 50 mg via ORAL
  Filled 2023-01-01 (×14): qty 1

## 2023-01-01 MED ORDER — CLONIDINE HCL 0.1 MG PO TABS
0.2000 mg | ORAL_TABLET | Freq: Every day | ORAL | Status: DC
  Administered 2023-01-02 – 2023-01-04 (×3): 0.2 mg via ORAL
  Filled 2023-01-01 (×4): qty 2

## 2023-01-01 MED ORDER — QUETIAPINE FUMARATE 100 MG PO TABS
100.0000 mg | ORAL_TABLET | Freq: Every day | ORAL | Status: DC
  Administered 2023-01-01 – 2023-01-02 (×2): 100 mg via ORAL
  Filled 2023-01-01 (×2): qty 1

## 2023-01-01 NOTE — Progress Notes (Signed)
Patient endorsed "racing thoughts" and requested medication. Patient given haldol and benadryl at 1326. A few hours later patient again came up stating he had racing thoughts and asked for the same medication once more. Patient educated on times of prn medications. Patient also given hydroxyzine po prn at 1220. Patient cooperative on unit.     01/01/23 0827  Psych Admission Type (Psych Patients Only)  Admission Status Voluntary  Psychosocial Assessment  Patient Complaints Anxiety  Eye Contact Fair  Facial Expression Flat  Affect Anxious  Speech Logical/coherent  Interaction Assertive  Motor Activity Fidgety;Slow  Appearance/Hygiene Unremarkable  Behavior Characteristics Cooperative;Anxious  Mood Anxious  Thought Process  Coherency WDL  Content WDL  Delusions None reported or observed  Perception WDL  Hallucination None reported or observed  Judgment Limited  Confusion None  Danger to Self  Current suicidal ideation? Denies  Agreement Not to Harm Self Yes  Description of Agreement verbal  Danger to Others  Danger to Others None reported or observed

## 2023-01-01 NOTE — Plan of Care (Signed)
  Problem: Activity: Goal: Sleeping patterns will improve Outcome: Progressing   Problem: Health Behavior/Discharge Planning: Goal: Compliance with treatment plan for underlying cause of condition will improve Outcome: Progressing   Problem: Safety: Goal: Periods of time without injury will increase Outcome: Progressing   

## 2023-01-01 NOTE — Group Note (Signed)
Recreation Therapy Group Note   Group Topic:Goal Setting  Group Date: 01/01/2023 Start Time: 1000 End Time: 1100 Facilitators: Rosina Lowenstein, LRT, CTRS Location:  Craft Room  Group Description: Product/process development scientist. Patients were given many different magazines, a glue stick, markers, and a piece of cardstock paper. LRT and pts discussed the importance of having goals in life. LRT and pts discussed the difference between short-term and long-term goals, as well as what a SMART goal is. LRT encouraged pts to create a vision board, with images they picked and then cut out with safety scissors from the magazine, for themselves, that capture their short and long-term goals. LRT encouraged pts to show and explain their vision board to the group.   Goal Area(s) Addressed:  Patient will gain knowledge of short vs. long term goals.  Patient will identify goals for themselves. Patient will practice setting SMART goals. Patient will verbalize their goals to LRT and peers.   Affect/Mood: N/A   Participation Level: Did not attend    Clinical Observations/Individualized Feedback: Emmery did not attend group.   Plan: Continue to engage patient in RT group sessions 2-3x/week.   Rosina Lowenstein, LRT, CTRS 01/01/2023 11:56 AM

## 2023-01-01 NOTE — Group Note (Signed)
Date:  01/01/2023 Time:  6:25 PM  Group Topic/Focus:  Healthy Communication:   The focus of this group is to discuss communication, barriers to communication, as well as healthy ways to communicate with others. Outdoor recreation structured activity    Participation Level:  Active  Participation Quality:  Appropriate  Affect:  Appropriate  Cognitive:  Appropriate  Insight: Appropriate  Engagement in Group:  Developing/Improving  Modes of Intervention:  Activity  Additional Comments:    Kada Friesen 01/01/2023, 6:25 PM

## 2023-01-01 NOTE — Group Note (Signed)
Date:  01/01/2023 Time:  1:51 PM  Group Topic/Focus:  Emotional Education:   The focus of this group is to discuss what feelings/emotions are, and how they are experienced. Recovery Goals:   The focus of this group is to identify appropriate goals for recovery and establish a plan to achieve them.    Participation Level:  Active  Participation Quality:  Appropriate  Affect:  Appropriate  Cognitive:  Appropriate  Insight: Appropriate  Engagement in Group:  Developing/Improving  Modes of Intervention:  Activity and Discussion  Additional Comments:    Ronald [name] 01/01/2023, 1:51 PM

## 2023-01-01 NOTE — Progress Notes (Addendum)
[facility] MD Progress Note  01/01/2023 4:06 PM Ronald [name].  MRN:  [id number] Subjective:  47yo Caucansian male I don't know what to do." "I am not suicidal, but I have thoughts."Reports intrusive thoughts of self-harm: "on hurting self." States, "I don't want to be around people, voices bother me, I get so irritated and angry." Reports using clonazepam for 9 years ("I got it off the street"), now tapered off with the last dose 48 hours ago.Alternates between crying and laughing.History: History of ECT treatment in the past for severe depression.hand tremors observed. Intrusive thoughts reported, with no active suicidal ideation but distressing preoccupation with self-harm Principal Problem: Major depressive disorder, recurrent severe without psychotic features (HCC) Diagnosis: Principal Problem:   Major depressive disorder, recurrent severe without psychotic features (HCC) Active Problems:   Benzodiazepine withdrawal (HCC)   Generalized anxiety disorder  Total Time spent with patient: 45 minutes  Past Psychiatric History: Depression Polysubstance Abuse  Past Medical History:  Past Medical History:  Diagnosis Date   Anxiety    Depression    Family history of adverse reaction to anesthesia    difficult to wake up - Mother   GERD (gastroesophageal reflux disease)    PTSD (post-traumatic stress disorder)     Past Surgical History:  Procedure Laterality Date   ELECTROCONVULSIVE THERAPY     FRACTURE SURGERY     wrist left   INSERTION OF MESH  08/25/2021   Procedure: INSERTION OF MESH;  Surgeon: Sung Amabile, DO;  Location: [facility] ORS;  Service: General;;   UMBILICAL HERNIA REPAIR  08/25/2021   Procedure: HERNIA REPAIR UMBILICAL ADULT;  Surgeon: Sung Amabile, DO;  Location: [facility] ORS;  Service: General;;   Family History: History reviewed. No pertinent family history. Family Psychiatric  History: none reported Social History:  Social History   Substance and Sexual Activity  Alcohol  Use Yes   Comment: occasional     Social History   Substance and Sexual Activity  Drug Use No    Social History   Socioeconomic History   Marital status: Single    Spouse name: Not on file   Number of children: Not on file   Years of education: Not on file   Highest education level: Not on file  Occupational History   Not on file  Tobacco Use   Smoking status: Every Day    Current packs/day: 1.00    Types: Cigarettes   Smokeless tobacco: Never  Vaping Use   Vaping status: Every Day  Substance and Sexual Activity   Alcohol use: Yes    Comment: occasional   Drug use: No   Sexual activity: Not on file  Other Topics Concern   Not on file  Social History Narrative   Not on file   Social Determinants of Health   Financial Resource Strain: Medium Risk (12/10/2022)   [facility] from [facility]   Overall Financial Resource Strain (CARDIA)    Difficulty of Paying Living Expenses: Somewhat hard  Food Insecurity: No Food Insecurity (12/26/2022)   Hunger Vital Sign    Worried About Running Out of Food in the Last Year: Never true    Ran Out of Food in the Last Year: Never true  Transportation Needs: No Transportation Needs (12/26/2022)   PRAPARE - Administrator, Civil Service (Medical): No    Lack of Transportation (Non-Medical): No  Physical Activity: Not on file  Stress: Not on file  Social Connections: Not on file  Additional Social History:                         Sleep: Good  Appetite:  Good  Current Medications: Current Facility-Administered Medications  Medication Dose Route Frequency Provider Last Rate Last Admin   acetaminophen (TYLENOL) tablet 650 mg  650 mg Oral Q6H PRN Lauree Chandler, NP       alum & mag hydroxide-simeth (MAALOX/MYLANTA) 200-200-20 MG/5ML suspension 30 mL  30 mL Oral Q4H PRN Lauree Chandler, NP   30 mL at 12/29/22 1702   amLODipine (NORVASC) tablet 5 mg  5 mg Oral Daily Charm Rings, NP   5 mg at  01/01/23 0827   [START ON 01/02/2023] cloNIDine (CATAPRES) tablet 0.2 mg  0.2 mg Oral Daily Myriam Forehand, NP       haloperidol (HALDOL) tablet 5 mg  5 mg Oral TID PRN Lauree Chandler, NP   5 mg at 01/01/23 1326   And   diphenhydrAMINE (BENADRYL) capsule 50 mg  50 mg Oral TID PRN Lauree Chandler, NP   50 mg at 01/01/23 1326   escitalopram (LEXAPRO) tablet 10 mg  10 mg Oral Daily Charm Rings, NP   10 mg at 01/01/23 0827   gabapentin (NEURONTIN) capsule 300 mg  300 mg Oral TID Myriam Forehand, NP   300 mg at 01/01/23 1220   hydrOXYzine (ATARAX) tablet 50 mg  50 mg Oral TID PRN Myriam Forehand, NP   50 mg at 01/01/23 1220   magnesium hydroxide (MILK OF MAGNESIA) suspension 30 mL  30 mL Oral Daily PRN Lauree Chandler, NP       multivitamin with minerals tablet 1 tablet  1 tablet Oral Daily Charm Rings, NP   1 tablet at 01/01/23 0827   nicotine (NICODERM CQ - dosed in mg/24 hours) patch 21 mg  21 mg Transdermal Daily Charm Rings, NP   21 mg at 12/29/22 0818   nicotine polacrilex (NICORETTE) gum 2 mg  2 mg Oral PRN Charm Rings, NP   2 mg at 12/30/22 1524   QUEtiapine (SEROQUEL) tablet 100 mg  100 mg Oral QHS Myriam Forehand, NP       thiamine (VITAMIN B1) tablet 100 mg  100 mg Oral Daily Charm Rings, NP   100 mg at 01/01/23 0827   traZODone (DESYREL) tablet 50 mg  50 mg Oral QHS PRN Lauree Chandler, NP   50 mg at 12/31/22 2115    Lab Results: No results found for this or any previous visit (from the past 48 hour(s)).  Blood Alcohol level:  Lab Results  Component Value Date   ETH <10 12/26/2022   ETH 196 (H) 09/08/2018    Metabolic Disorder Labs: Lab Results  Component Value Date   HGBA1C 5.2 12/30/2022   MPG 102.54 12/30/2022   No results found for: "PROLACTIN" Lab Results  Component Value Date   CHOL 145 12/30/2022   TRIG 249 (H) 12/30/2022   HDL 27 (L) 12/30/2022   CHOLHDL 5.4 12/30/2022   VLDL 50 (H) 12/30/2022   LDLCALC 68 12/30/2022    Physical  Findings: AIMS:  , ,  ,  ,    CIWA:  CIWA-Ar Total: 2 COWS:     Musculoskeletal: Strength & Muscle Tone: within normal limits Gait & Station: normal Patient leans: N/A  Psychiatric Specialty Exam:  Presentation  General Appearance:  Appropriate for Environment  Eye Contact: Minimal  Speech: Blocked; Other (comment) (tearful)  Speech Volume: Increased  Handedness: Right   Mood and Affect  Mood: Anxious; Irritable  Affect: Labile   Thought Process  Thought Processes: Coherent  Descriptions of Associations:Intact  Orientation:Full (Time, Place and Person) (and situation)  Thought Content:Logical  History of Schizophrenia/Schizoaffective disorder:No  Duration of Psychotic Symptoms:none reported Hallucinations:Hallucinations: None  Ideas of Reference:None (social anxiety)  Suicidal Thoughts:Suicidal Thoughts: No  Homicidal Thoughts:Homicidal Thoughts: No   Sensorium  Memory: Immediate Good; Remote Good  Judgment: Poor  Insight: Poor   Executive Functions  Concentration: Good  Attention Span: Poor  Recall: Good  Fund of Knowledge: Good  Language: Good   Psychomotor Activity  Psychomotor Activity:Psychomotor Activity: Normal   Assets  Assets: Communication Skills   Sleep  Sleep:Sleep: Good Number of Hours of Sleep: 7    Physical Exam: Physical Exam Vitals and nursing note reviewed.  Constitutional:      Appearance: Normal appearance.  HENT:     Head: Normocephalic and atraumatic.     Nose: Nose normal.  Cardiovascular:     Rate and Rhythm: Normal rate.  Pulmonary:     Effort: Pulmonary effort is normal.  Musculoskeletal:        General: Normal range of motion.  Neurological:     General: No focal deficit present.     Mental Status: He is alert and oriented to person, place, and time.  Psychiatric:        Attention and Perception: Attention and perception normal.        Mood and Affect: Mood is anxious.  Affect is labile.        Speech: Speech normal.        Behavior: Behavior is withdrawn. Behavior is cooperative.        Cognition and Memory: Cognition and memory normal.        Judgment: Judgment normal.     Comments: Intrusive thoughts    Review of Systems  Neurological:  Positive for tremors.  Psychiatric/Behavioral:  Positive for depression. The patient is nervous/anxious and has insomnia.   All other systems reviewed and are negative.  Blood pressure (!) 137/92, pulse 84, temperature (!) 97.3 F (36.3 C), resp. rate 18, height 5\' 7"  (1.702 m), weight 87.1 kg, SpO2 97%. Body mass index is 30.07 kg/m.   Treatment Plan Summary: Daily contact with patient to assess and evaluate symptoms and progress in treatment and Medication management Propranolol 10 mg BID PRN for tremors and irritability associated with benzodiazepine withdrawal. Gabapentin 300mg  TID for anxiety and withdrawal management, titrating to response. Quetiapine 100mg  nightly to address labile mood and auditory disturbances. Closely monitor for worsening withdrawal symptoms, such as increased agitation, tremors, or hallucinations. Myriam Forehand, NP 01/01/2023, 4:06 PM

## 2023-01-01 NOTE — Group Note (Signed)
Date:  01/01/2023 Time:  10:29 PM  Group Topic/Focus:  Personal Choices and Values:   The focus of this group is to help patients assess and explore the importance of values in their lives, how their values affect their decisions, how they express their values and what opposes their expression.    Participation Level:  Did Not Attend  Participation Quality:   none  Affect:   none  Cognitive:   none  Insight: None  Engagement in Group:   none  Modes of Intervention:   none  Additional Comments:     [name] 01/01/2023, 10:29 PM

## 2023-01-01 NOTE — Group Note (Addendum)
[facility] LCSW Group Therapy Note   Group Date: 01/01/2023 Start Time: 1300 End Time: 1410  Type of Therapy/Topic:  Group Therapy:  Feelings about Diagnosis  Participation Level:  Active   Mood: Appropriate    Description of Group:    This group will allow patients to explore their thoughts and feelings about diagnoses they have received. Patients will be guided to explore their level of understanding and acceptance of these diagnoses. Facilitator will encourage patients to process their thoughts and feelings about the reactions of others to their diagnosis, and will guide patients in identifying ways to discuss their diagnosis with significant others in their lives. This group will be process-oriented, with patients participating in exploration of their own experiences as well as giving and receiving support and challenge from other group members.   Therapeutic Goals: 1. Patient will demonstrate understanding of diagnosis as evidence by identifying two or more symptoms of the disorder:  2. Patient will be able to express two feelings regarding the diagnosis 3. Patient will demonstrate ability to communicate their needs through discussion and/or role plays  Summary of Patient Progress: During group patient was forthcoming with information and offered insight and perspective to his peers. Patient was attentive and engaged appropriately in conversation. Patient shared his personal experiences which offered encouragement to peers. Patient was respectful and empathetic to peers. Patient did well adding to the positive group dynamic and receiving feedback from group facilitator and peers.   Therapeutic Modalities:   Cognitive Behavioral Therapy Brief Therapy Feelings Identification    Lowry Ram, LCSW

## 2023-01-01 NOTE — Progress Notes (Signed)
[facility] MD Progress Note  12/31/2022 4:16 PM Ronald [name].  MRN:  [id number] Subjective:  47yo caucasian male endorses depression and anxiety, stating he "feels like hitting the wall" but is unsure of the trigger, possibly related to "coming off of the benzos." Denials: Denies suicidal ideation (SI), homicidal ideation (HI), auditory or visual hallucinations (AVH), and pain.Patient compliant with medications and treatment plan.  Principal Problem: Major depressive disorder, recurrent severe without psychotic features (HCC) Diagnosis: Principal Problem:   Major depressive disorder, recurrent severe without psychotic features (HCC) Active Problems:   Benzodiazepine withdrawal (HCC)   Generalized anxiety disorder  Total Time spent with patient: 45 minutes  Past Psychiatric History: Polysubstance Abuse, Anxiety Depression  Past Medical History:  Past Medical History:  Diagnosis Date   Anxiety    Depression    Family history of adverse reaction to anesthesia    difficult to wake up - Mother   GERD (gastroesophageal reflux disease)    PTSD (post-traumatic stress disorder)     Past Surgical History:  Procedure Laterality Date   ELECTROCONVULSIVE THERAPY     FRACTURE SURGERY     wrist left   INSERTION OF MESH  08/25/2021   Procedure: INSERTION OF MESH;  Surgeon: Sung Amabile, DO;  Location: [facility] ORS;  Service: General;;   UMBILICAL HERNIA REPAIR  08/25/2021   Procedure: HERNIA REPAIR UMBILICAL ADULT;  Surgeon: Sung Amabile, DO;  Location: [facility] ORS;  Service: General;;   Family History: History reviewed. No pertinent family history. Family Psychiatric  History: none reported Social History:  Social History   Substance and Sexual Activity  Alcohol Use Yes   Comment: occasional     Social History   Substance and Sexual Activity  Drug Use No    Social History   Socioeconomic History   Marital status: Single    Spouse name: Not on file   Number of children: Not on file    Years of education: Not on file   Highest education level: Not on file  Occupational History   Not on file  Tobacco Use   Smoking status: Every Day    Current packs/day: 1.00    Types: Cigarettes   Smokeless tobacco: Never  Vaping Use   Vaping status: Every Day  Substance and Sexual Activity   Alcohol use: Yes    Comment: occasional   Drug use: No   Sexual activity: Not on file  Other Topics Concern   Not on file  Social History Narrative   Not on file   Social Determinants of Health   Financial Resource Strain: Medium Risk (12/10/2022)   Received from [facility]   Overall Financial Resource Strain (CARDIA)    Difficulty of Paying Living Expenses: Somewhat hard  Food Insecurity: No Food Insecurity (12/26/2022)   Hunger Vital Sign    Worried About Running Out of Food in the Last Year: Never true    Ran Out of Food in the Last Year: Never true  Transportation Needs: No Transportation Needs (12/26/2022)   PRAPARE - Administrator, Civil Service (Medical): No    Lack of Transportation (Non-Medical): No  Physical Activity: Not on file  Stress: Not on file  Social Connections: Not on file   Additional Social History:                         Sleep: Good  Appetite:  Good  Current Medications: Current Facility-Administered Medications  Medication  Dose Route Frequency Provider Last Rate Last Admin   acetaminophen (TYLENOL) tablet 650 mg  650 mg Oral Q6H PRN Lauree Chandler, NP       alum & mag hydroxide-simeth (MAALOX/MYLANTA) 200-200-20 MG/5ML suspension 30 mL  30 mL Oral Q4H PRN Lauree Chandler, NP   30 mL at 12/29/22 1702   amLODipine (NORVASC) tablet 5 mg  5 mg Oral Daily Charm Rings, NP   5 mg at 01/01/23 0827   [START ON 01/02/2023] cloNIDine (CATAPRES) tablet 0.2 mg  0.2 mg Oral Daily Myriam Forehand, NP       haloperidol (HALDOL) tablet 5 mg  5 mg Oral TID PRN Lauree Chandler, NP   5 mg at 01/01/23 1326   And    diphenhydrAMINE (BENADRYL) capsule 50 mg  50 mg Oral TID PRN Lauree Chandler, NP   50 mg at 01/01/23 1326   escitalopram (LEXAPRO) tablet 10 mg  10 mg Oral Daily Charm Rings, NP   10 mg at 01/01/23 0827   gabapentin (NEURONTIN) capsule 300 mg  300 mg Oral TID Myriam Forehand, NP   300 mg at 01/01/23 1220   hydrOXYzine (ATARAX) tablet 50 mg  50 mg Oral TID PRN Myriam Forehand, NP   50 mg at 01/01/23 1220   magnesium hydroxide (MILK OF MAGNESIA) suspension 30 mL  30 mL Oral Daily PRN Lauree Chandler, NP       multivitamin with minerals tablet 1 tablet  1 tablet Oral Daily Charm Rings, NP   1 tablet at 01/01/23 0827   nicotine (NICODERM CQ - dosed in mg/24 hours) patch 21 mg  21 mg Transdermal Daily Charm Rings, NP   21 mg at 12/29/22 0818   nicotine polacrilex (NICORETTE) gum 2 mg  2 mg Oral PRN Charm Rings, NP   2 mg at 12/30/22 1524   propranolol (INDERAL) tablet 10 mg  10 mg Oral Daily Myriam Forehand, NP       QUEtiapine (SEROQUEL) tablet 100 mg  100 mg Oral QHS Myriam Forehand, NP       thiamine (VITAMIN B1) tablet 100 mg  100 mg Oral Daily Charm Rings, NP   100 mg at 01/01/23 0827   traZODone (DESYREL) tablet 50 mg  50 mg Oral QHS PRN Lauree Chandler, NP   50 mg at 12/31/22 2115    Lab Results: No results found for this or any previous visit (from the past 48 hour(s)).  Blood Alcohol level:  Lab Results  Component Value Date   ETH <10 12/26/2022   ETH 196 (H) 09/08/2018    Metabolic Disorder Labs: Lab Results  Component Value Date   HGBA1C 5.2 12/30/2022   MPG 102.54 12/30/2022   No results found for: "PROLACTIN" Lab Results  Component Value Date   CHOL 145 12/30/2022   TRIG 249 (H) 12/30/2022   HDL 27 (L) 12/30/2022   CHOLHDL 5.4 12/30/2022   VLDL 50 (H) 12/30/2022   LDLCALC 68 12/30/2022    Physical Findings: AIMS:  , ,  ,  ,    CIWA:  CIWA-Ar Total: 2 COWS:     Musculoskeletal: Strength & Muscle Tone: within normal limits Gait & Station:  normal Patient leans: N/A  Psychiatric Specialty Exam:  Presentation  General Appearance:  Appropriate for Environment  Eye Contact: Minimal  Speech: Blocked; Other (comment) (tearful)  Speech Volume: Increased  Handedness: Right   Mood and  Affect  Mood: Anxious; Irritable  Affect: Labile   Thought Process  Thought Processes: Coherent  Descriptions of Associations:Intact  Orientation:Full (Time, Place and Person) (and situation)  Thought Content:Logical  History of Schizophrenia/Schizoaffective disorder:No  Duration of Psychotic Symptoms: none reported Hallucinations:Hallucinations: None  Ideas of Reference:None (social anxiety)  Suicidal Thoughts:Suicidal Thoughts: No  Homicidal Thoughts:Homicidal Thoughts: No   Sensorium  Memory: Immediate Good; Remote Good  Judgment: Poor  Insight: Poor   Executive Functions  Concentration: Good  Attention Span: Poor  Recall: Good  Fund of Knowledge: Good  Language: Good   Psychomotor Activity  Psychomotor Activity: Psychomotor Activity: Normal   Assets  Assets: Communication Skills   Sleep  Sleep: Sleep: Good Number of Hours of Sleep: 7    Physical Exam: Physical Exam Vitals and nursing note reviewed.  Constitutional:      Appearance: Normal appearance.  HENT:     Head: Normocephalic and atraumatic.     Nose: Nose normal.  Pulmonary:     Effort: Pulmonary effort is normal.  Musculoskeletal:        General: Normal range of motion.     Cervical back: Normal range of motion.  Neurological:     General: No focal deficit present.     Mental Status: He is alert and oriented to person, place, and time.  Psychiatric:        Attention and Perception: Attention and perception normal.        Mood and Affect: Mood is anxious and depressed.        Speech: Speech normal.        Behavior: Behavior normal. Behavior is cooperative.        Thought Content: Thought content normal.         Cognition and Memory: Cognition and memory normal.        Judgment: Judgment normal.    Review of Systems  Neurological:  Positive for tremors.  Psychiatric/Behavioral:  Positive for depression. The patient is nervous/anxious.   All other systems reviewed and are negative.  Blood pressure (!) 137/92, pulse 84, temperature (!) 97.3 F (36.3 C), resp. rate 18, height 5\' 7"  (1.702 m), weight 87.1 kg, SpO2 97%. Body mass index is 30.07 kg/m.   Treatment Plan Summary: Daily contact with patient to assess and evaluate symptoms and progress in treatment and Medication management Medication Management: Scheduled medications administered as per MD orders; no adverse drug reactions noted. Therapeutic Support: Provided support and encouragement during interaction. Safety Monitoring: Patient contracts for safety, understands the importance of notifying staff of problems or concerns, and remains safe on the unit at this time. Myriam Forehand, NP 01/01/2023, 4:16 PM

## 2023-01-01 NOTE — Plan of Care (Signed)
.  D- Patient alert and oriented. Denies SI, HI, AVH, and pain.  Problem: Coping: Goal: Ability to verbalize frustrations and anger appropriately will improve Outcome: Progressing Goal: Ability to demonstrate self-control will improve Outcome: Progressing   Problem: Activity: Goal: Interest or engagement in activities will improve Outcome: Progressing Goal: Sleeping patterns will improve Outcome: Progressing

## 2023-01-02 DIAGNOSIS — F332 Major depressive disorder, recurrent severe without psychotic features: Secondary | ICD-10-CM | POA: Diagnosis not present

## 2023-01-02 NOTE — Progress Notes (Signed)
   01/02/23 0813  Psych Admission Type (Psych Patients Only)  Admission Status Voluntary  Psychosocial Assessment  Patient Complaints Anxiety  Eye Contact Fair  Facial Expression Flat  Affect Anxious  Speech Logical/coherent  Interaction Assertive  Motor Activity Fidgety;Slow  Appearance/Hygiene Unremarkable  Behavior Characteristics Cooperative  Mood Anxious  Thought Process  Coherency WDL  Content WDL  Delusions None reported or observed  Perception WDL  Hallucination None reported or observed  Judgment Limited  Confusion None  Danger to Self  Current suicidal ideation? Denies  Agreement Not to Harm Self Yes  Description of Agreement verbal  Danger to Others  Danger to Others None reported or observed

## 2023-01-02 NOTE — Progress Notes (Signed)
[facility] MD Progress Note  01/02/2023 6:43 PM Ronald [name].  MRN:  [id number] Subjective:   47 year old Caucasian male, states, "I don't know what to do. I am not suicidal, but I am still having intrusive thoughts." He reports distressing intrusive thoughts of self-harm, including "hurting self." The patient denies active suicidal ideation but appears preoccupied with self-harm thoughts, which cause significant emotional distress.The patient is experiencing intrusive thoughts causing distress but denies active intent or plan for self-harm. Emotional dysregulation and withdrawal symptoms are being addressed through medication.Medication adjustments, including increased Seroquel and introduction of Clonidine, aim to further address mood stabilization, anxiety, and sleep concern Principal Problem: Major depressive disorder, recurrent severe without psychotic features (HCC) Diagnosis: Principal Problem:   Major depressive disorder, recurrent severe without psychotic features (HCC) Active Problems:   Benzodiazepine withdrawal (HCC)   Generalized anxiety disorder  Total Time spent with patient: 2 hours  Past Psychiatric History: Depression Polysubstance Abuse   Past Medical History:  Past Medical History:  Diagnosis Date   Anxiety    Depression    Family history of adverse reaction to anesthesia    difficult to wake up - Mother   GERD (gastroesophageal reflux disease)    PTSD (post-traumatic stress disorder)     Past Surgical History:  Procedure Laterality Date   ELECTROCONVULSIVE THERAPY     FRACTURE SURGERY     wrist left   INSERTION OF MESH  08/25/2021   Procedure: INSERTION OF MESH;  Surgeon: Sung Amabile, DO;  Location: [facility] ORS;  Service: General;;   UMBILICAL HERNIA REPAIR  08/25/2021   Procedure: HERNIA REPAIR UMBILICAL ADULT;  Surgeon: Sung Amabile, DO;  Location: [facility] ORS;  Service: General;;   Family History: History reviewed. No pertinent family history. Family Psychiatric   History: none noted Social History:  Social History   Substance and Sexual Activity  Alcohol Use Yes   Comment: occasional     Social History   Substance and Sexual Activity  Drug Use No    Social History   Socioeconomic History   Marital status: Single    Spouse name: Not on file   Number of children: Not on file   Years of education: Not on file   Highest education level: Not on file  Occupational History   Not on file  Tobacco Use   Smoking status: Every Day    Current packs/day: 1.00    Types: Cigarettes   Smokeless tobacco: Never  Vaping Use   Vaping status: Every Day  Substance and Sexual Activity   Alcohol use: Yes    Comment: occasional   Drug use: No   Sexual activity: Not on file  Other Topics Concern   Not on file  Social History Narrative   Not on file   Social Determinants of Health   Financial Resource Strain: Medium Risk (12/10/2022)   Received from [facility]   Overall Financial Resource Strain (CARDIA)    Difficulty of Paying Living Expenses: Somewhat hard  Food Insecurity: No Food Insecurity (12/26/2022)   Hunger Vital Sign    Worried About Running Out of Food in the Last Year: Never true    Ran Out of Food in the Last Year: Never true  Transportation Needs: No Transportation Needs (12/26/2022)   PRAPARE - Administrator, Civil Service (Medical): No    Lack of Transportation (Non-Medical): No  Physical Activity: Not on file  Stress: Not on file  Social Connections: Not on file  Additional Social History:                         Sleep: Good  Appetite:  Good  Current Medications: Current Facility-Administered Medications  Medication Dose Route Frequency Provider Last Rate Last Admin   acetaminophen (TYLENOL) tablet 650 mg  650 mg Oral Q6H PRN Lauree Chandler, NP       alum & mag hydroxide-simeth (MAALOX/MYLANTA) 200-200-20 MG/5ML suspension 30 mL  30 mL Oral Q4H PRN Lauree Chandler, NP   30 mL at  12/29/22 1702   amLODipine (NORVASC) tablet 5 mg  5 mg Oral Daily Charm Rings, NP   5 mg at 01/02/23 0813   cloNIDine (CATAPRES) tablet 0.2 mg  0.2 mg Oral Daily Myriam Forehand, NP   0.2 mg at 01/02/23 0813   haloperidol (HALDOL) tablet 5 mg  5 mg Oral TID PRN Lauree Chandler, NP   5 mg at 01/01/23 2116   And   diphenhydrAMINE (BENADRYL) capsule 50 mg  50 mg Oral TID PRN Lauree Chandler, NP   50 mg at 01/01/23 2116   escitalopram (LEXAPRO) tablet 10 mg  10 mg Oral Daily Charm Rings, NP   10 mg at 01/02/23 0813   gabapentin (NEURONTIN) capsule 300 mg  300 mg Oral TID Myriam Forehand, NP   300 mg at 01/02/23 1719   hydrOXYzine (ATARAX) tablet 50 mg  50 mg Oral TID PRN Myriam Forehand, NP   50 mg at 01/02/23 1236   magnesium hydroxide (MILK OF MAGNESIA) suspension 30 mL  30 mL Oral Daily PRN Lauree Chandler, NP       multivitamin with minerals tablet 1 tablet  1 tablet Oral Daily Charm Rings, NP   1 tablet at 01/02/23 0813   nicotine (NICODERM CQ - dosed in mg/24 hours) patch 21 mg  21 mg Transdermal Daily Charm Rings, NP   21 mg at 12/29/22 0818   nicotine polacrilex (NICORETTE) gum 2 mg  2 mg Oral PRN Charm Rings, NP   2 mg at 12/30/22 1524   propranolol (INDERAL) tablet 10 mg  10 mg Oral Daily Myriam Forehand, NP   10 mg at 01/02/23 0813   QUEtiapine (SEROQUEL) tablet 100 mg  100 mg Oral QHS Myriam Forehand, NP   100 mg at 01/01/23 2114   thiamine (VITAMIN B1) tablet 100 mg  100 mg Oral Daily Charm Rings, NP   100 mg at 01/02/23 0813   traZODone (DESYREL) tablet 50 mg  50 mg Oral QHS PRN Lauree Chandler, NP   50 mg at 01/01/23 2114    Lab Results: No results found for this or any previous visit (from the past 48 hour(s)).  Blood Alcohol level:  Lab Results  Component Value Date   ETH <10 12/26/2022   ETH 196 (H) 09/08/2018    Metabolic Disorder Labs: Lab Results  Component Value Date   HGBA1C 5.2 12/30/2022   MPG 102.54 12/30/2022   No results found  for: "PROLACTIN" Lab Results  Component Value Date   CHOL 145 12/30/2022   TRIG 249 (H) 12/30/2022   HDL 27 (L) 12/30/2022   CHOLHDL 5.4 12/30/2022   VLDL 50 (H) 12/30/2022   LDLCALC 68 12/30/2022    Physical Findings: AIMS:  , ,  ,  ,    CIWA:  CIWA-Ar Total: 2 COWS:     Musculoskeletal: Strength &  Muscle Tone: within normal limits Gait & Station: normal Patient leans: N/A  Psychiatric Specialty Exam:  Presentation  General Appearance:  Appropriate for Environment  Eye Contact: Minimal  Speech: Blocked; Other (comment) (tearful)  Speech Volume: Increased  Handedness: Right   Mood and Affect  Mood: Anxious; Irritable  Affect: Labile   Thought Process  Thought Processes: Coherent  Descriptions of Associations:Intact  Orientation:Full (Time, Place and Person) (and situation)  Thought Content:Logical  History of Schizophrenia/Schizoaffective disorder:No  Duration of Psychotic Symptoms:No data recorded Hallucinations:Hallucinations: None  Ideas of Reference:None (social anxiety)  Suicidal Thoughts:Suicidal Thoughts: No  Homicidal Thoughts:Homicidal Thoughts: No   Sensorium  Memory: Immediate Good; Remote Good  Judgment: Poor  Insight: Poor   Executive Functions  Concentration: Good  Attention Span: Poor  Recall: Good  Fund of Knowledge: Good  Language: Good   Psychomotor Activity  Psychomotor Activity: Psychomotor Activity: Normal   Assets  Assets: Communication Skills   Sleep  Sleep: Sleep: Good Number of Hours of Sleep: 7    Physical Exam: Physical Exam Vitals and nursing note reviewed.  Constitutional:      Appearance: Normal appearance.  Musculoskeletal:        General: Normal range of motion.  Neurological:     General: No focal deficit present.     Mental Status: He is alert and oriented to person, place, and time. Mental status is at baseline.  Psychiatric:        Attention and Perception:  Attention and perception normal.        Mood and Affect: Mood is anxious. Affect is tearful.        Speech: Speech normal.        Behavior: Behavior normal. Behavior is cooperative.        Thought Content: Thought content normal.        Cognition and Memory: Cognition and memory normal.        Judgment: Judgment is impulsive.    Review of Systems  Neurological:  Positive for tremors.   Blood pressure (!) 137/100, pulse 71, temperature (!) 97.5 F (36.4 C), resp. rate 19, height 5\' 7"  (1.702 m), weight 87.1 kg, SpO2 98%. Body mass index is 30.07 kg/m.   Treatment Plan Summary: Daily contact with patient to assess and evaluate symptoms and progress in treatment and Medication management Gabapentin 300 mg TID: For anxiety and withdrawal management, titrating as needed based on response. Quetiapine 100 mg nightly: To address labile mood and reported auditory disturbances. Escitalopram 10 mg daily: For depressive symptoms Amlodipine 5 mg daily: For hypertension Encourage continued adherence to therapy sessions to address underlying anxiety and mood instability. Provide education on recognizing medication side effects and when to report them Myriam Forehand, NP 01/02/2023, 6:43 PM

## 2023-01-02 NOTE — Group Note (Signed)
Recreation Therapy Group Note   Group Topic:Emotion Expression  Group Date: 01/02/2023 Start Time: 1045 End Time: 1135 Facilitators: Rosina Lowenstein, LRT, CTRS Location:  Craft Room  Group Description: Gratitude Journaling. Patients and LRT discussed what gratitude means, how we can express it and what it means to [place], personally. LRT gave an education handout on the definition of gratitude that also gave different examples of gratitude exercises that they could try. One of the examples was "Gratitude Letter", which prompted patient to write a letter to someone they appreciate. LRT played soft music while everyone wrote their letter. Once letter was completed, LRT encouraged people to read their letter if they wanted to; or share who they wrote it to, at minimum. LRT and pts talked about the benefits of journaling and how it can be used as a positive coping skill. LRT and pts processed how showing gratitude towards themselves, and others can be applied to everyday life post-discharge. LRT offered journals to pts afterwards.   Goal Area(s) Addressed:  Patient will identify the definition of gratitude. Patient will learn different gratitude exercises. Patient will practice writing/journaling as a coping skill.  Patient will identify a new coping skill.   Affect/Mood: N/A   Participation Level: Did not attend    Clinical Observations/Individualized Feedback: Ronald [name] did not attend group.  Plan: Continue to engage patient in RT group sessions 2-3x/week.   Rosina Lowenstein, LRT, CTRS 01/02/2023 12:00 PM

## 2023-01-02 NOTE — Group Note (Signed)
Date:  01/02/2023 Time:  10:08 AM  Group Topic/Focus:  Goals Group:   The focus of this group is to help patients establish daily goals to achieve during treatment and discuss how the patient can incorporate goal setting into their daily lives to aide in recovery.    Participation Level:  Did Not Attend   Lynelle Smoke [facility] 01/02/2023, 10:08 AM

## 2023-01-02 NOTE — BH IP Treatment Plan (Unsigned)
Interdisciplinary Treatment and Diagnostic Plan Update  01/02/2023 Time of Session: 9:00AM Ronald [name]. MRN: [id number]  Principal Diagnosis: Major depressive disorder, recurrent severe without psychotic features (HCC)  Secondary Diagnoses: Principal Problem:   Major depressive disorder, recurrent severe without psychotic features (HCC) Active Problems:   Benzodiazepine withdrawal (HCC)   Generalized anxiety disorder   Current Medications:  Current Facility-Administered Medications  Medication Dose Route Frequency Provider Last Rate Last Admin   acetaminophen (TYLENOL) tablet 650 mg  650 mg Oral Q6H PRN Lauree Chandler, NP       alum & mag hydroxide-simeth (MAALOX/MYLANTA) 200-200-20 MG/5ML suspension 30 mL  30 mL Oral Q4H PRN Lauree Chandler, NP   30 mL at 12/29/22 1702   amLODipine (NORVASC) tablet 5 mg  5 mg Oral Daily Charm Rings, NP   5 mg at 01/02/23 0813   cloNIDine (CATAPRES) tablet 0.2 mg  0.2 mg Oral Daily Myriam Forehand, NP   0.2 mg at 01/02/23 0813   haloperidol (HALDOL) tablet 5 mg  5 mg Oral TID PRN Lauree Chandler, NP   5 mg at 01/01/23 2116   And   diphenhydrAMINE (BENADRYL) capsule 50 mg  50 mg Oral TID PRN Lauree Chandler, NP   50 mg at 01/01/23 2116   escitalopram (LEXAPRO) tablet 10 mg  10 mg Oral Daily Charm Rings, NP   10 mg at 01/02/23 0813   gabapentin (NEURONTIN) capsule 300 mg  300 mg Oral TID Myriam Forehand, NP   300 mg at 01/02/23 0813   hydrOXYzine (ATARAX) tablet 50 mg  50 mg Oral TID PRN Myriam Forehand, NP   50 mg at 01/01/23 2114   magnesium hydroxide (MILK OF MAGNESIA) suspension 30 mL  30 mL Oral Daily PRN Lauree Chandler, NP       multivitamin with minerals tablet 1 tablet  1 tablet Oral Daily Charm Rings, NP   1 tablet at 01/02/23 0813   nicotine (NICODERM CQ - dosed in mg/24 hours) patch 21 mg  21 mg Transdermal Daily Charm Rings, NP   21 mg at 12/29/22 0818   nicotine polacrilex (NICORETTE) gum 2 mg  2 mg Oral  PRN Charm Rings, NP   2 mg at 12/30/22 1524   propranolol (INDERAL) tablet 10 mg  10 mg Oral Daily Myriam Forehand, NP   10 mg at 01/02/23 0813   QUEtiapine (SEROQUEL) tablet 100 mg  100 mg Oral QHS Myriam Forehand, NP   100 mg at 01/01/23 2114   thiamine (VITAMIN B1) tablet 100 mg  100 mg Oral Daily Charm Rings, NP   100 mg at 01/02/23 0813   traZODone (DESYREL) tablet 50 mg  50 mg Oral QHS PRN Lauree Chandler, NP   50 mg at 01/01/23 2114   PTA Medications: Medications Prior to Admission  Medication Sig Dispense Refill Last Dose   acetaminophen (TYLENOL) 325 MG tablet Take 2 tablets (650 mg total) by mouth every 8 (eight) hours as needed for up to 30 doses for mild pain, moderate pain, fever or headache. (Patient not taking: Reported on 12/26/2022) 30 tablet 0    ALPRAZolam (XANAX) 0.5 MG tablet Take 1 mg by mouth 3 (three) times daily as needed. (Patient not taking: Reported on 12/26/2022)      amLODipine (NORVASC) 5 MG tablet Take 1 tablet (5 mg total) by mouth daily. 30 tablet 2    clonazePAM (KLONOPIN) 0.5  MG tablet Take 0.5 mg by mouth 2 (two) times daily as needed for anxiety.      famotidine (PEPCID) 20 MG tablet Take 20 mg by mouth 2 (two) times daily as needed for heartburn or indigestion.      HYDROcodone-acetaminophen (NORCO/VICODIN) 5-325 MG tablet Take 1 tablet by mouth every 8 (eight) hours as needed for up to 6 doses for severe pain (not relieved by other meds). (Patient not taking: Reported on 12/26/2022) 6 tablet 0    hydrOXYzine (ATARAX) 50 MG tablet Take 1 tablet by mouth 3 times daily as needed. 90 tablet 0    hydrOXYzine (VISTARIL) 50 MG capsule Take 1 capsule (50 mg total) by mouth 3 (three) times daily as needed. (Patient not taking: Reported on 12/26/2022) 30 capsule 0    ibuprofen (ADVIL) 800 MG tablet Take 1 tablet (800 mg total) by mouth every 8 (eight) hours as needed for up to 30 doses for mild pain or moderate pain. (Patient not taking: Reported on 12/26/2022)  30 tablet 0    meloxicam (MOBIC) 15 MG tablet Take 1 tablet (15 mg total) by mouth daily. (Patient not taking: Reported on 12/26/2022) 30 tablet 1    methylPREDNISolone (MEDROL DOSEPAK) 4 MG TBPK tablet 6 day dose pack - take as directed (Patient not taking: Reported on 12/26/2022) 21 tablet 0    nicotine (NICODERM CQ - DOSED IN MG/24 HOURS) 14 mg/24hr patch Place 14 mg onto the skin every 14 (fourteen) days.      nicotine polacrilex (NICORETTE) 4 MG gum Place 4 mg inside cheek as needed for smoking cessation.      sertraline (ZOLOFT) 50 MG tablet Take 1 tablet by mouth daily.       Patient Stressors:    Patient Strengths:    Treatment Modalities: Medication Management, Group therapy, Case management,  1 to 1 session with clinician, Psychoeducation, Recreational therapy.   Physician Treatment Plan for Primary Diagnosis: Major depressive disorder, recurrent severe without psychotic features (HCC) Long Term Goal(s): Improvement in symptoms so as ready for discharge   Short Term Goals: Ability to identify changes in lifestyle to reduce recurrence of condition will improve Ability to verbalize feelings will improve Ability to disclose and discuss suicidal ideas Ability to demonstrate self-control will improve Ability to identify and develop effective coping behaviors will improve Ability to maintain clinical measurements within normal limits will improve Compliance with prescribed medications will improve Ability to identify triggers associated with substance abuse/mental health issues will improve  Medication Management: Evaluate patient's response, side effects, and tolerance of medication regimen.  Therapeutic Interventions: 1 to 1 sessions, Unit Group sessions and Medication administration.  Evaluation of Outcomes: Progressing  Physician Treatment Plan for Secondary Diagnosis: Principal Problem:   Major depressive disorder, recurrent severe without psychotic features (HCC) Active  Problems:   Benzodiazepine withdrawal (HCC)   Generalized anxiety disorder  Long Term Goal(s): Improvement in symptoms so as ready for discharge   Short Term Goals: Ability to identify changes in lifestyle to reduce recurrence of condition will improve Ability to verbalize feelings will improve Ability to disclose and discuss suicidal ideas Ability to demonstrate self-control will improve Ability to identify and develop effective coping behaviors will improve Ability to maintain clinical measurements within normal limits will improve Compliance with prescribed medications will improve Ability to identify triggers associated with substance abuse/mental health issues will improve     Medication Management: Evaluate patient's response, side effects, and tolerance of medication regimen.  Therapeutic Interventions: 1  to 1 sessions, Unit Group sessions and Medication administration.  Evaluation of Outcomes: Progressing   RN Treatment Plan for Primary Diagnosis: Major depressive disorder, recurrent severe without psychotic features (HCC) Long Term Goal(s): Knowledge of disease and therapeutic regimen to maintain health will improve  Short Term Goals: Ability to demonstrate self-control, Ability to participate in decision making will improve, Ability to verbalize feelings will improve, Ability to disclose and discuss suicidal ideas, Ability to identify and develop effective coping behaviors will improve, and Compliance with prescribed medications will improve  Medication Management: RN will administer medications as ordered by provider, will assess and evaluate patient's response and provide education to patient for prescribed medication. RN will report any adverse and/or side effects to prescribing provider.  Therapeutic Interventions: 1 on 1 counseling sessions, Psychoeducation, Medication administration, Evaluate responses to treatment, Monitor vital signs and CBGs as ordered, Perform/monitor  CIWA, COWS, AIMS and Fall Risk screenings as ordered, Perform wound care treatments as ordered.  Evaluation of Outcomes: Progressing   LCSW Treatment Plan for Primary Diagnosis: Major depressive disorder, recurrent severe without psychotic features (HCC) Long Term Goal(s): Safe transition to appropriate next level of care at discharge, Engage patient in therapeutic group addressing interpersonal concerns.  Short Term Goals: Engage patient in aftercare planning with referrals and resources, Increase social support, Increase ability to appropriately verbalize feelings, Increase emotional regulation, Facilitate acceptance of mental health diagnosis and concerns, and Increase skills for wellness and recovery  Therapeutic Interventions: Assess for all discharge needs, 1 to 1 time with Social worker, Explore available resources and support systems, Assess for adequacy in community support network, Educate family and significant other(s) on suicide prevention, Complete Psychosocial Assessment, Interpersonal group therapy.  Evaluation of Outcomes: Progressing   Progress in Treatment: Attending groups: Yes. Participating in groups: Yes. Taking medication as prescribed: Yes. Toleration medication: Yes. Family/Significant other contact made: Yes, individual(s) contacted:  SPE completed with the patient's mother Patient understands diagnosis: Yes. Discussing patient identified problems/goals with staff: Yes. Medical problems stabilized or resolved: Yes. Denies suicidal/homicidal ideation: Yes. Issues/concerns per patient self-inventory: No. Other: none   New problem(s) identified: No, Describe:  none  Update 01/02/2023:  No changes at this time.    New Short Term/Long Term Goal(s): detox, elimination of symptoms of psychosis, medication management for mood stabilization; elimination of SI thoughts; development of comprehensive mental wellness/sobriety plan.  Update 01/02/2023:  No changes at this  time.    Patient Goals:  "I don't want to hurt anybody"  Update 01/02/2023:  No changes at this time.    Discharge Plan or Barriers: CSW to assist in the development of appropriate discharge plans.  Update 01/02/2023:  Patient continues to report that he is having thoughts of wanting to harm others.  He reports that this is not normal for him.  He reports that would like aftercare at discharge.    Reason for Continuation of Hospitalization: Anxiety Depression Medication stabilization Suicidal ideation   Estimated Length of Stay:  1-7 days Update 01/02/2023:  TBD    Last 3 [place] Suicide Severity Risk Score: Flowsheet Row Admission (Current) from 12/26/2022 in [facility]INE Most recent reading at 12/26/2022  9:00 PM ED from 12/26/2022 in [facility] Emergency Department at [facility] Most recent reading at 12/26/2022  1:41 AM ED from 12/04/2022 in [facility] Emergency Department at [facility] Most recent reading at 12/04/2022  2:21 PM  C-SSRS RISK CATEGORY No Risk No Risk No Risk  Last PHQ 2/9 Scores:     No data to display          Scribe for Treatment Team: Harden Mo, LCSW 01/02/2023 12:12 PM

## 2023-01-02 NOTE — Group Note (Signed)
Date:  01/02/2023 Time:  5:27 PM  Group Topic/Focus:  Self Care:   The focus of this group is to help patients understand the importance of self-care in order to improve or restore emotional, physical, spiritual, interpersonal, and financial health.      Participation Level:  Did Not Attend   Lynelle Smoke [facility] 01/02/2023, 5:27 PM

## 2023-01-02 NOTE — Group Note (Signed)
Date:  01/02/2023 Time:  10:08 PM  Group Topic/Focus:  Wrap-Up Group:   The focus of this group is to help patients review their daily goal of treatment and discuss progress on daily workbooks.    Participation Level:  Did Not Attend   Lenore Cordia 01/02/2023, 10:08 PM

## 2023-01-02 NOTE — Plan of Care (Signed)
  Problem: Coping: Goal: Ability to verbalize frustrations and anger appropriately will improve Outcome: Progressing Goal: Ability to demonstrate self-control will improve Outcome: Progressing   Problem: Health Behavior/Discharge Planning: Goal: Compliance with treatment plan for underlying cause of condition will improve Outcome: Progressing   Problem: Safety: Goal: Periods of time without injury will increase Outcome: Progressing

## 2023-01-03 DIAGNOSIS — F332 Major depressive disorder, recurrent severe without psychotic features: Secondary | ICD-10-CM | POA: Diagnosis not present

## 2023-01-03 MED ORDER — OLANZAPINE 5 MG PO TBDP
5.0000 mg | ORAL_TABLET | Freq: Every day | ORAL | Status: DC
  Administered 2023-01-03: 5 mg via ORAL
  Filled 2023-01-03: qty 1

## 2023-01-03 MED ORDER — QUETIAPINE FUMARATE 200 MG PO TABS
200.0000 mg | ORAL_TABLET | Freq: Every day | ORAL | Status: DC
  Administered 2023-01-03 – 2023-01-06 (×4): 200 mg via ORAL
  Filled 2023-01-03 (×4): qty 1

## 2023-01-03 NOTE — Plan of Care (Signed)

## 2023-01-03 NOTE — Group Note (Signed)
Date:  01/03/2023 Time:  10:20 AM  Group Topic/Focus:  Goals Group:   The focus of this group is to help patients establish daily goals to achieve during treatment and discuss how the patient can incorporate goal setting into their daily lives to aide in recovery.    Participation Level:  Did Not Attend   Lynelle Smoke [facility] 01/03/2023, 10:20 AM

## 2023-01-03 NOTE — Progress Notes (Signed)
   01/03/23 0900  Psych Admission Type (Psych Patients Only)  Admission Status Voluntary  Psychosocial Assessment  Patient Complaints Anxiety  Eye Contact Brief  Facial Expression Anxious;Animated  Affect Anxious  Speech Rapid  Interaction Assertive  Motor Activity Fidgety  Appearance/Hygiene Unremarkable  Behavior Characteristics Cooperative;Appropriate to situation;Anxious  Mood Anxious  Thought Process  Coherency WDL  Content WDL  Delusions None reported or observed  Perception WDL  Hallucination None reported or observed  Judgment WDL  Confusion WDL  Danger to Self  Current suicidal ideation? Denies  Agreement Not to Harm Self Yes  Description of Agreement verbal  Danger to Others  Danger to Others None reported or observed

## 2023-01-03 NOTE — Plan of Care (Signed)
  Problem: Education: Goal: Emotional status will improve Outcome: Progressing Goal: Mental status will improve Outcome: Progressing  Patient is compliant with treatment Plan denies SI/HI/A/VH and verbally contracts for safety. Interacting well with Peers and Staff visible in [place]. Q 15 minutes safety checks ongoing. Patient remains safe.

## 2023-01-03 NOTE — Group Note (Signed)
Date:  01/03/2023 Time:  7:08 PM  Group Topic/Focus:  Activity Group:  The focus of the group is to promote activity with the patients to encourage them to go outside to the courtyard to get some fresh air and some exercise.     Participation Level:  Active  Participation Quality:  Appropriate  Affect:  Appropriate  Cognitive:  Appropriate  Insight: Appropriate  Engagement in Group:  Engaged  Modes of Intervention:  Activity  Additional Comments:    Mary Sella Anush Wiedeman 01/03/2023, 7:08 PM

## 2023-01-04 ENCOUNTER — Other Ambulatory Visit: Payer: Self-pay

## 2023-01-04 DIAGNOSIS — F332 Major depressive disorder, recurrent severe without psychotic features: Secondary | ICD-10-CM | POA: Diagnosis not present

## 2023-01-04 MED ORDER — PROPRANOLOL HCL 10 MG PO TABS
10.0000 mg | ORAL_TABLET | Freq: Every day | ORAL | 0 refills | Status: DC
  Filled 2023-01-04: qty 30, 30d supply, fill #0

## 2023-01-04 MED ORDER — HYDROXYZINE HCL 50 MG PO TABS
50.0000 mg | ORAL_TABLET | Freq: Three times a day (TID) | ORAL | 0 refills | Status: DC | PRN
  Filled 2023-01-04: qty 30, 10d supply, fill #0

## 2023-01-04 MED ORDER — OLANZAPINE 10 MG PO TBDP
10.0000 mg | ORAL_TABLET | Freq: Once | ORAL | Status: AC
  Administered 2023-01-04: 10 mg via ORAL
  Filled 2023-01-04: qty 1

## 2023-01-04 MED ORDER — TRAZODONE HCL 50 MG PO TABS
50.0000 mg | ORAL_TABLET | Freq: Every evening | ORAL | 0 refills | Status: DC | PRN
  Filled 2023-01-04: qty 30, 30d supply, fill #0

## 2023-01-04 MED ORDER — CLONIDINE HCL 0.1 MG PO TABS
0.1000 mg | ORAL_TABLET | Freq: Once | ORAL | Status: AC
  Administered 2023-01-04: 0.1 mg via ORAL
  Filled 2023-01-04: qty 1

## 2023-01-04 MED ORDER — VITAMIN B-1 100 MG PO TABS
100.0000 mg | ORAL_TABLET | Freq: Every day | ORAL | 0 refills | Status: AC
  Filled 2023-01-04: qty 30, 30d supply, fill #0

## 2023-01-04 MED ORDER — OLANZAPINE 5 MG PO TABS
7.5000 mg | ORAL_TABLET | Freq: Every day | ORAL | 0 refills | Status: DC
  Filled 2023-01-04: qty 45, 30d supply, fill #0

## 2023-01-04 MED ORDER — NICOTINE POLACRILEX 2 MG MT GUM
2.0000 mg | CHEWING_GUM | OROMUCOSAL | 0 refills | Status: DC | PRN
  Filled 2023-01-04: qty 100, 30d supply, fill #0

## 2023-01-04 MED ORDER — ADULT MULTIVITAMIN W/MINERALS CH
1.0000 | ORAL_TABLET | Freq: Every day | ORAL | 0 refills | Status: AC
  Filled 2023-01-04: qty 30, 30d supply, fill #0

## 2023-01-04 MED ORDER — QUETIAPINE FUMARATE 200 MG PO TABS
200.0000 mg | ORAL_TABLET | Freq: Every day | ORAL | 0 refills | Status: DC
  Filled 2023-01-04: qty 30, 30d supply, fill #0

## 2023-01-04 MED ORDER — GABAPENTIN 300 MG PO CAPS
300.0000 mg | ORAL_CAPSULE | Freq: Three times a day (TID) | ORAL | 0 refills | Status: DC
  Filled 2023-01-04: qty 90, 30d supply, fill #0

## 2023-01-04 MED ORDER — NICOTINE 21 MG/24HR TD PT24
21.0000 mg | MEDICATED_PATCH | Freq: Every day | TRANSDERMAL | 0 refills | Status: DC
  Filled 2023-01-04: qty 28, 28d supply, fill #0

## 2023-01-04 MED ORDER — ESCITALOPRAM OXALATE 10 MG PO TABS
10.0000 mg | ORAL_TABLET | Freq: Every day | ORAL | 0 refills | Status: DC
  Filled 2023-01-04: qty 30, 30d supply, fill #0

## 2023-01-04 NOTE — Group Note (Signed)
Date:  01/04/2023 Time:  5:29 PM  Group Topic/Focus:  Activity Group:  The focus of the group is to promote activity for the patients and encourage them to go outside in the courtyard and get some fresh air and some exercise.    Participation Level:  Active  Participation Quality:  Appropriate  Affect:  Appropriate  Cognitive:  Appropriate  Insight: Appropriate  Engagement in Group:  Engaged  Modes of Intervention:  Activity  Additional Comments:    Ronald [name] [name] 01/04/2023, 5:29 PM

## 2023-01-04 NOTE — Progress Notes (Signed)
[facility] MD Progress Note  01/04/2023 4:19 PM [name].  MRN:  [id number] Subjective: 47 yo Caucasian male  eports feeling ready for discharge, stating, "I am ok. Please make sure I have that medication you ordered." The patient became visibly upset during a group session due to another patient's outburst but denies suicidal ideation (SI), homicidal ideation (HI), delusions, hallucinations, or intrusive thoughts. The patient remains cooperative and engaged in discharge planning.No evidence of acute psychiatric symptoms. Principal Problem: Major depressive disorder, recurrent severe without psychotic features (HCC) Diagnosis: Principal Problem:   Major depressive disorder, recurrent severe without psychotic features (HCC) Active Problems:   Benzodiazepine withdrawal (HCC)   Generalized anxiety disorder  Total Time spent with patient: 1 hour  Past Psychiatric History: Depression Polysubstance Abuse     Past Medical History:  Past Medical History:  Diagnosis Date   Anxiety    Depression    Family history of adverse reaction to anesthesia    difficult to wake up - Mother   GERD (gastroesophageal reflux disease)    PTSD (post-traumatic stress disorder)     Past Surgical History:  Procedure Laterality Date   ELECTROCONVULSIVE THERAPY     FRACTURE SURGERY     wrist left   INSERTION OF MESH  08/25/2021   Procedure: INSERTION OF MESH;  Surgeon: Sung Amabile, DO;  Location: [facility] ORS;  Service: General;;   UMBILICAL HERNIA REPAIR  08/25/2021   Procedure: HERNIA REPAIR UMBILICAL ADULT;  Surgeon: Sung Amabile, DO;  Location: [facility] ORS;  Service: General;;   Family History: History reviewed. No pertinent family history. Family Psychiatric  History: none reported Social History:  Social History   Substance and Sexual Activity  Alcohol Use Yes   Comment: occasional     Social History   Substance and Sexual Activity  Drug Use No    Social History   Socioeconomic History   Marital  status: Single    Spouse name: Not on file   Number of children: Not on file   Years of education: Not on file   Highest education level: Not on file  Occupational History   Not on file  Tobacco Use   Smoking status: Every Day    Current packs/day: 1.00    Types: Cigarettes   Smokeless tobacco: Never  Vaping Use   Vaping status: Every Day  Substance and Sexual Activity   Alcohol use: Yes    Comment: occasional   Drug use: No   Sexual activity: Not on file  Other Topics Concern   Not on file  Social History Narrative   Not on file   Social Determinants of Health   Financial Resource Strain: Medium Risk (12/10/2022)   Received from [facility]   Overall Financial Resource Strain (CARDIA)    Difficulty of Paying Living Expenses: Somewhat hard  Food Insecurity: No Food Insecurity (12/26/2022)   Hunger Vital Sign    Worried About Running Out of Food in the Last Year: Never true    Ran Out of Food in the Last Year: Never true  Transportation Needs: No Transportation Needs (12/26/2022)   PRAPARE - Administrator, Civil Service (Medical): No    Lack of Transportation (Non-Medical): No  Physical Activity: Not on file  Stress: Not on file  Social Connections: Not on file   Additional Social History:  Sleep: Good  Appetite:  Good  Current Medications: Current Facility-Administered Medications  Medication Dose Route Frequency Provider Last Rate Last Admin   acetaminophen (TYLENOL) tablet 650 mg  650 mg Oral Q6H PRN Lauree Chandler, NP       alum & mag hydroxide-simeth (MAALOX/MYLANTA) 200-200-20 MG/5ML suspension 30 mL  30 mL Oral Q4H PRN Lauree Chandler, NP   30 mL at 12/29/22 1702   amLODipine (NORVASC) tablet 5 mg  5 mg Oral Daily Charm Rings, NP   5 mg at 01/04/23 0825   cloNIDine (CATAPRES) tablet 0.1 mg  0.1 mg Oral Once Myriam Forehand, NP       escitalopram (LEXAPRO) tablet 10 mg  10 mg Oral Daily Charm Rings, NP   10 mg at 01/04/23 0825   gabapentin (NEURONTIN) capsule 300 mg  300 mg Oral TID Myriam Forehand, NP   300 mg at 01/04/23 1232   hydrOXYzine (ATARAX) tablet 50 mg  50 mg Oral TID PRN Myriam Forehand, NP   50 mg at 01/04/23 1412   magnesium hydroxide (MILK OF MAGNESIA) suspension 30 mL  30 mL Oral Daily PRN Lauree Chandler, NP       multivitamin with minerals tablet 1 tablet  1 tablet Oral Daily Charm Rings, NP   1 tablet at 01/04/23 0102   nicotine (NICODERM CQ - dosed in mg/24 hours) patch 21 mg  21 mg Transdermal Daily Charm Rings, NP   21 mg at 01/04/23 1048   nicotine polacrilex (NICORETTE) gum 2 mg  2 mg Oral PRN Charm Rings, NP   2 mg at 01/04/23 1412   OLANZapine zydis (ZYPREXA) disintegrating tablet 10 mg  10 mg Oral Once Myriam Forehand, NP       propranolol (INDERAL) tablet 10 mg  10 mg Oral Daily Myriam Forehand, NP   10 mg at 01/04/23 7253   QUEtiapine (SEROQUEL) tablet 200 mg  200 mg Oral QHS Myriam Forehand, NP   200 mg at 01/03/23 2112   thiamine (VITAMIN B1) tablet 100 mg  100 mg Oral Daily Charm Rings, NP   100 mg at 01/04/23 6644   traZODone (DESYREL) tablet 50 mg  50 mg Oral QHS PRN Lauree Chandler, NP   50 mg at 01/03/23 2112    Lab Results: No results found for this or any previous visit (from the past 48 hour(s)).  Blood Alcohol level:  Lab Results  Component Value Date   ETH <10 12/26/2022   ETH 196 (H) 09/08/2018    Metabolic Disorder Labs: Lab Results  Component Value Date   HGBA1C 5.2 12/30/2022   MPG 102.54 12/30/2022   No results found for: "PROLACTIN" Lab Results  Component Value Date   CHOL 145 12/30/2022   TRIG 249 (H) 12/30/2022   HDL 27 (L) 12/30/2022   CHOLHDL 5.4 12/30/2022   VLDL 50 (H) 12/30/2022   LDLCALC 68 12/30/2022    Physical Findings: AIMS:  , ,  ,  ,    CIWA:  CIWA-Ar Total: 3 COWS:     Musculoskeletal: Strength & Muscle Tone: within normal limits Gait & Station: normal Patient leans:  N/A  Psychiatric Specialty Exam:  Presentation  General Appearance:  Appropriate for Environment; Neat  Eye Contact: Good  Speech: Clear and Coherent  Speech Volume: Normal  Handedness: Right   Mood and Affect  Mood: Anxious  Affect: Congruent   Thought Process  Thought Processes: Coherent  Descriptions of Associations:Intact  Orientation:Full (Time, Place and Person) (and situation)  Thought Content:WDL  History of Schizophrenia/Schizoaffective disorder:No  Duration of Psychotic Symptoms:none Hallucinations:Hallucinations: None  Ideas of Reference:None  Suicidal Thoughts:Suicidal Thoughts: No  Homicidal Thoughts:Homicidal Thoughts: No   Sensorium  Memory: Immediate Good; Remote Good  Judgment: Fair  Insight: Fair   Art therapist  Concentration: Fair  Attention Span: Good  Recall: Good  Fund of Knowledge: Good  Language: Good   Psychomotor Activity  Psychomotor Activity: Psychomotor Activity: Normal   Assets  Assets: Communication Skills; Desire for Improvement; Social Support; Housing   Sleep  Sleep: Sleep: Good Number of Hours of Sleep: 7    Physical Exam: Physical Exam Vitals and nursing note reviewed.  Constitutional:      Appearance: Normal appearance.  HENT:     Head: Normocephalic and atraumatic.     Nose: Nose normal.  Pulmonary:     Effort: Pulmonary effort is normal.  Musculoskeletal:        General: Normal range of motion.     Cervical back: Normal range of motion.  Neurological:     General: No focal deficit present.     Mental Status: He is alert and oriented to person, place, and time. Mental status is at baseline.  Psychiatric:        Attention and Perception: Attention and perception normal.        Mood and Affect: Affect normal. Mood is anxious.        Speech: Speech normal.        Behavior: Behavior normal. Behavior is cooperative.        Thought Content: Thought content  normal.        Cognition and Memory: Cognition and memory normal.        Judgment: Judgment normal.    ROS Blood pressure (!) 142/111, pulse (!) 109, temperature 97.9 F (36.6 C), resp. rate 16, height 5\' 7"  (1.702 m), weight 87.1 kg, SpO2 99%. Body mass index is 30.07 kg/m.   Treatment Plan Summary: Daily contact with patient to assess and evaluate symptoms and progress in treatment and Medication management Administered Zydis 5 mg (once) for acute stabilization. Administered Catapres 0.1 mg (once) for emotional regulation. Ordered a 30-day supply of all prescribed medications for discharge to ensure continuity of care. Reinforce coping strategies for managing external triggers, such as grounding techniques and mindfulness practices. Gabapentin 300 mg TID: For anxiety and withdrawal management, titrating as needed based on response. Quetiapine 100 mg nightly: To address labile mood and reported auditory disturbances. Escitalopram 10 mg daily: For depressive symptoms Amlodipine 5 mg daily: For hypertension Myriam Forehand, NP 01/04/2023, 4:19 PM

## 2023-01-04 NOTE — Plan of Care (Signed)

## 2023-01-04 NOTE — Group Note (Signed)
Date:  01/04/2023 Time:  5:25 PM  Group Topic/Focus:  Crisis Planning:   The purpose of this group is to help patients create a crisis plan for use upon discharge or in the future, as needed.    Participation Level:  Active  Participation Quality:  Appropriate  Affect:  Appropriate  Cognitive:  Appropriate  Insight: Appropriate  Engagement in Group:  Engaged  Modes of Intervention:  Activity  Additional Comments:    Ronald [name] [name] 01/04/2023, 5:25 PM

## 2023-01-04 NOTE — Progress Notes (Addendum)
[facility] MD Progress Note  01/04/2023 4:16 PM Ronald [name].  MRN:  [id number] Subjective: 47yo Caucasian male  reports experiencing intrusive thoughts, stating, "It's so many I can't explain it." The patient appears emotionally distressed, tearful, and crying during the assessment. The patient denies suicidal ideation (SI), homicidal ideation (HI), or delusions. The patient acknowledges the distress caused by the intrusive thoughts but is cooperative with the intervention. Principal Problem: Major depressive disorder, recurrent severe without psychotic features (HCC) Diagnosis: Principal Problem:   Major depressive disorder, recurrent severe without psychotic features (HCC) Active Problems:   Benzodiazepine withdrawal (HCC)   Generalized anxiety disorder  Total Time spent with patient: 45 minutes  Past Psychiatric History: Depression Polysubstance Abuse     Past Medical History:  Past Medical History:  Diagnosis Date   Anxiety    Depression    Family history of adverse reaction to anesthesia    difficult to wake up - Mother   GERD (gastroesophageal reflux disease)    PTSD (post-traumatic stress disorder)     Past Surgical History:  Procedure Laterality Date   ELECTROCONVULSIVE THERAPY     FRACTURE SURGERY     wrist left   INSERTION OF MESH  08/25/2021   Procedure: INSERTION OF MESH;  Surgeon: Sung Amabile, DO;  Location: [facility] ORS;  Service: General;;   UMBILICAL HERNIA REPAIR  08/25/2021   Procedure: HERNIA REPAIR UMBILICAL ADULT;  Surgeon: Sung Amabile, DO;  Location: [facility] ORS;  Service: General;;   Family History: History reviewed. No pertinent family history. Family Psychiatric  History: none reported Social History:  Social History   Substance and Sexual Activity  Alcohol Use Yes   Comment: occasional     Social History   Substance and Sexual Activity  Drug Use No    Social History   Socioeconomic History   Marital status: Single    Spouse name: Not on file    Number of children: Not on file   Years of education: Not on file   Highest education level: Not on file  Occupational History   Not on file  Tobacco Use   Smoking status: Every Day    Current packs/day: 1.00    Types: Cigarettes   Smokeless tobacco: Never  Vaping Use   Vaping status: Every Day  Substance and Sexual Activity   Alcohol use: Yes    Comment: occasional   Drug use: No   Sexual activity: Not on file  Other Topics Concern   Not on file  Social History Narrative   Not on file   Social Determinants of Health   Financial Resource Strain: Medium Risk (12/10/2022)   Received from [facility]   Overall Financial Resource Strain (CARDIA)    Difficulty of Paying Living Expenses: Somewhat hard  Food Insecurity: No Food Insecurity (12/26/2022)   Hunger Vital Sign    Worried About Running Out of Food in the Last Year: Never true    Ran Out of Food in the Last Year: Never true  Transportation Needs: No Transportation Needs (12/26/2022)   PRAPARE - Administrator, Civil Service (Medical): No    Lack of Transportation (Non-Medical): No  Physical Activity: Not on file  Stress: Not on file  Social Connections: Not on file   Additional Social History:                         Sleep: Good  Appetite:  Good  Current Medications:  Current Facility-Administered Medications  Medication Dose Route Frequency Provider Last Rate Last Admin   acetaminophen (TYLENOL) tablet 650 mg  650 mg Oral Q6H PRN Lauree Chandler, NP       alum & mag hydroxide-simeth (MAALOX/MYLANTA) 200-200-20 MG/5ML suspension 30 mL  30 mL Oral Q4H PRN Lauree Chandler, NP   30 mL at 12/29/22 1702   amLODipine (NORVASC) tablet 5 mg  5 mg Oral Daily Charm Rings, NP   5 mg at 01/04/23 0825   cloNIDine (CATAPRES) tablet 0.1 mg  0.1 mg Oral Once Myriam Forehand, NP       escitalopram (LEXAPRO) tablet 10 mg  10 mg Oral Daily Charm Rings, NP   10 mg at 01/04/23 0825   gabapentin  (NEURONTIN) capsule 300 mg  300 mg Oral TID Myriam Forehand, NP   300 mg at 01/04/23 1232   hydrOXYzine (ATARAX) tablet 50 mg  50 mg Oral TID PRN Myriam Forehand, NP   50 mg at 01/04/23 1412   magnesium hydroxide (MILK OF MAGNESIA) suspension 30 mL  30 mL Oral Daily PRN Lauree Chandler, NP       multivitamin with minerals tablet 1 tablet  1 tablet Oral Daily Charm Rings, NP   1 tablet at 01/04/23 0981   nicotine (NICODERM CQ - dosed in mg/24 hours) patch 21 mg  21 mg Transdermal Daily Charm Rings, NP   21 mg at 01/04/23 1048   nicotine polacrilex (NICORETTE) gum 2 mg  2 mg Oral PRN Charm Rings, NP   2 mg at 01/04/23 1412   OLANZapine zydis (ZYPREXA) disintegrating tablet 10 mg  10 mg Oral Once Myriam Forehand, NP       propranolol (INDERAL) tablet 10 mg  10 mg Oral Daily Myriam Forehand, NP   10 mg at 01/04/23 1914   QUEtiapine (SEROQUEL) tablet 200 mg  200 mg Oral QHS Myriam Forehand, NP   200 mg at 01/03/23 2112   thiamine (VITAMIN B1) tablet 100 mg  100 mg Oral Daily Charm Rings, NP   100 mg at 01/04/23 7829   traZODone (DESYREL) tablet 50 mg  50 mg Oral QHS PRN Lauree Chandler, NP   50 mg at 01/03/23 2112    Lab Results: No results found for this or any previous visit (from the past 48 hour(s)).  Blood Alcohol level:  Lab Results  Component Value Date   ETH <10 12/26/2022   ETH 196 (H) 09/08/2018    Metabolic Disorder Labs: Lab Results  Component Value Date   HGBA1C 5.2 12/30/2022   MPG 102.54 12/30/2022   No results found for: "PROLACTIN" Lab Results  Component Value Date   CHOL 145 12/30/2022   TRIG 249 (H) 12/30/2022   HDL 27 (L) 12/30/2022   CHOLHDL 5.4 12/30/2022   VLDL 50 (H) 12/30/2022   LDLCALC 68 12/30/2022    Physical Findings: AIMS:  , ,  ,  ,    CIWA:  CIWA-Ar Total: 3 COWS:     Musculoskeletal: Strength & Muscle Tone: within normal limits Gait & Station: normal Patient leans: N/A  Psychiatric Specialty Exam:  Presentation  General  Appearance:  Appropriate for Environment  Eye Contact: Good  Speech: Clear and Coherent  Speech Volume: Normal  Handedness: Right   Mood and Affect  Mood: Anxious; Irritable  Affect: Tearful   Thought Process  Thought Processes: Coherent  Descriptions of Associations:Intact  Orientation:Full (  Time, Place and Person)  Thought Content:WDL  History of Schizophrenia/Schizoaffective disorder:No  Duration of Psychotic Symptoms:none reported Hallucinations:Hallucinations: None  Ideas of Reference:-- (intrusive thoughts "so many")  Suicidal Thoughts:Suicidal Thoughts: No  Homicidal Thoughts:Homicidal Thoughts: No   Sensorium  Memory: Remote Good; Immediate Good  Judgment: Fair  Insight: Fair   Art therapist  Concentration: Good  Attention Span: Fair  Recall: Good  Fund of Knowledge: Good  Language: Good   Psychomotor Activity  Psychomotor Activity:Psychomotor Activity: Normal   Assets  Assets: Communication Skills; Desire for Improvement; Social Support   Sleep  Sleep:Sleep: Fair Number of Hours of Sleep: 6    Physical Exam: Physical Exam Vitals and nursing note reviewed.  Constitutional:      Appearance: Normal appearance.  HENT:     Head: Normocephalic and atraumatic.     Nose: Nose normal.  Pulmonary:     Effort: Pulmonary effort is normal.  Musculoskeletal:        General: Normal range of motion.     Cervical back: Normal range of motion.  Neurological:     General: No focal deficit present.     Mental Status: He is alert and oriented to person, place, and time.  Psychiatric:        Attention and Perception: Attention and perception normal.        Mood and Affect: Mood is anxious. Affect is tearful.        Speech: Speech normal.        Behavior: Behavior normal. Behavior is cooperative.        Thought Content: Thought content normal.        Cognition and Memory: Cognition and memory normal.         Judgment: Judgment is impulsive.    Review of Systems  Neurological:  Positive for tremors.  Psychiatric/Behavioral:  The patient is nervous/anxious.   All other systems reviewed and are negative.  Blood pressure (!) 142/111, pulse (!) 109, temperature 97.9 F (36.6 C), resp. rate 16, height 5\' 7"  (1.702 m), weight 87.1 kg, SpO2 99%. Body mass index is 30.07 kg/m.   Treatment Plan Summary: Daily contact with patient to assess and evaluate symptoms and progress in treatment and Medication management Zydis 10 mg sublingual (olanzapine): Administered during the assessment to reduce acute emotional distress. Zyprexa 7.5 mg (olanzapine): Prescribed to address ongoing intrusive thoughts and stabilize mood. Monitor for response to medications and reduction in distressing thoughts. Gabapentin 300 mg TID: For anxiety and withdrawal management, titrating as needed based on response. Quetiapine 100 mg nightly: To address labile mood and reported auditory disturbances. Escitalopram 10 mg daily: For depressive symptoms Amlodipine 5 mg daily: For hypertension Myriam Forehand, NP 01/04/2023, 4:16 PM

## 2023-01-04 NOTE — Plan of Care (Signed)
  Problem: Education: Goal: Emotional status will improve Outcome: Progressing Goal: Mental status will improve Outcome: Progressing   Problem: Activity: Goal: Interest or engagement in activities will improve Outcome: Progressing   Problem: Safety: Goal: Periods of time without injury will increase Outcome: Progressing

## 2023-01-04 NOTE — Progress Notes (Signed)
   01/04/23 1500  Psych Admission Type (Psych Patients Only)  Admission Status Voluntary  Psychosocial Assessment  Patient Complaints Anxiety;Other (Comment);Substance abuse (Ringing in L ear constant for 2 weeks.)  Eye Contact Fair  Facial Expression Anxious  Affect Anxious  Speech Logical/coherent  Interaction Assertive  Motor Activity Slow  Appearance/Hygiene Unremarkable  Behavior Characteristics Cooperative  Mood Anxious  Thought Process  Coherency WDL  Content WDL  Delusions None reported or observed  Perception WDL  Hallucination None reported or observed  Judgment WDL  Confusion WDL  Danger to Self  Current suicidal ideation? Denies  Agreement Not to Harm Self Yes  Description of Agreement verbal  Danger to Others  Danger to Others None reported or observed  Danger to Others Abnormal  Harmful Behavior to others No threats or harm toward other people  Destructive Behavior No threats or harm toward property   Patient alert and oriented. Patient denies SI, HI, AVH, and pain. Patient CIWA score of 3 at 1200. Scheduled medications administered to patient, per provider orders. Support and encouragement provided. Patient BP elevated, Keith Rake, NP aware. Once order for clonidine administered. Routine safety checks conducted every 15 minutes. Patient informed to notify staff with problems or concerns. Patient compliant with medications and treatment plan. Patient remains safe on the unit.

## 2023-01-04 NOTE — Group Note (Signed)
[facility] LCSW Group Therapy Note   Group Date: 01/04/2023 Start Time: 1300 End Time: 1400   Type of Therapy/Topic:  Group Therapy:  Emotion Regulation  Participation Level:  Active   Mood:  Description of Group:    The purpose of this group is to assist patients in learning to regulate negative emotions and experience positive emotions. Patients will be guided to discuss ways in which they have been vulnerable to their negative emotions. These vulnerabilities will be juxtaposed with experiences of positive emotions or situations, and patients challenged to use positive emotions to combat negative ones. Special emphasis will be placed on coping with negative emotions in conflict situations, and patients will process healthy conflict resolution skills.  Therapeutic Goals: Patient will identify two positive emotions or experiences to reflect on in order to balance out negative emotions:  Patient will label two or more emotions that they find the most difficult to experience:  Patient will be able to demonstrate positive conflict resolution skills through discussion or role plays:   Summary of Patient Progress: During group patient was forthcoming with information and offered insight and perspective to peers. Patient was attentive and engaged appropriately in conversation. Patient shared his personal experiences which offered encouragement to peers. Patient was respectful and empathetic to peers. Patient did well adding to the positive group dynamic and receiving feedback from group facilitator and peers. Patient became uncomfortable from the irate behavior of another group peer and did well excusing himself from group and speaking to a team member about his discomfort. Patient did well exercising a coping mechanism when feeling overwhelmed.   Therapeutic Modalities:   Cognitive Behavioral Therapy Feelings Identification Dialectical Behavioral Therapy   Lowry Ram, LCSW

## 2023-01-05 MED ORDER — CLONIDINE HCL 0.1 MG PO TABS
0.2000 mg | ORAL_TABLET | Freq: Once | ORAL | Status: AC
  Administered 2023-01-05: 0.2 mg via ORAL
  Filled 2023-01-05: qty 2

## 2023-01-05 NOTE — Plan of Care (Signed)

## 2023-01-05 NOTE — Progress Notes (Signed)
  [facility] Adult Case Management Discharge Plan :  Will you be returning to the same living situation after discharge: YES, returning to [address] [place]  At discharge, do you have transportation home?: Yes,  Patient stated his mom with pick him up. Do you have the ability to pay for your medications: No.   Release of information consent forms completed and in the chart;  Patient's signature needed at discharge.  Patient to Follow up at:  Follow-up Information     [facility], [facility]. Go to.   Why: Appointment scedualed for 01/08/33 at 9AM. Please bring discharge paperwork with you to appointment. Contact information: [address] [place] [address] [phone]                 Next level of care provider has access to [facility] Link:yes  Safety Planning and Suicide Prevention discussed: Valentino Hue  Dewayne Hatch, mom, [phone]     Has patient been referred to the Quitline?: Patient refused referral for treatment  Patient has been referred for addiction treatment: Yes, the patient will follow up with an outpatient provider for substance use disorder. Psychiatrist/APP: appointment made for [facility].  Marshell Levan, LCSW 01/05/2023, 10:11 AM

## 2023-01-05 NOTE — Group Note (Signed)
Date:  01/05/2023 Time:  8:31 PM  Group Topic/Focus:  Wrap-Up Group:   The focus of this group is to help patients review their daily goal of treatment and discuss progress on daily workbooks.    Participation Level:  Active  Participation Quality:  Appropriate and Attentive  Affect:  Appropriate  Cognitive:  Alert and Appropriate  Insight: Appropriate  Engagement in Group:  Engaged  Modes of Intervention:  Discussion  Additional Comments:     [name],Ronald Chiu E 01/05/2023, 8:31 PM

## 2023-01-05 NOTE — BHH Suicide Risk Assessment (Signed)
[facility] Discharge Suicide Risk Assessment   Principal Problem: Major depressive disorder, recurrent severe without psychotic features [facility]) Discharge Diagnoses: Principal Problem:   Major depressive disorder, recurrent severe without psychotic features (HCC) Active Problems:   Benzodiazepine withdrawal (HCC)   Generalized anxiety disorder   Total Time spent with patient: 2 hours  Musculoskeletal: Strength & Muscle Tone: within normal limits Gait & Station: normal Patient leans: N/A  Psychiatric Specialty Exam  Presentation  General Appearance:  Appropriate for Environment; Meticulous; Neat  Eye Contact: Good  Speech: Clear and Coherent; Normal Rate  Speech Volume: Normal  Handedness: Right   Mood and Affect  Mood: Euthymic  Duration of Depression Symptoms: Greater than two weeks  Affect: Appropriate   Thought Process  Thought Processes: Coherent  Descriptions of Associations:Intact  Orientation:Full (Time, Place and Person) (and situation)  Thought Content:WDL  History of Schizophrenia/Schizoaffective disorder:No  Duration of Psychotic Symptoms:none recorded Hallucinations:Hallucinations: None  Ideas of Reference:None  Suicidal Thoughts:Suicidal Thoughts: No  Homicidal Thoughts:Homicidal Thoughts: No   Sensorium  Memory: Remote Good  Judgment: Fair  Insight: Fair   Art therapist  Concentration: Good  Attention Span: Good  Recall: Good  Fund of Knowledge: Good  Language: Good   Psychomotor Activity  Psychomotor Activity: Psychomotor Activity: Normal   Assets  Assets: Communication Skills; Social Support; Housing   Sleep  Sleep: Sleep: Good Number of Hours of Sleep: 7   Physical Exam: Physical Exam Vitals reviewed.  Constitutional:      Appearance: Normal appearance.  HENT:     Head: Normocephalic and atraumatic.     Nose: Nose normal.  Pulmonary:     Effort: Pulmonary effort is normal.   Musculoskeletal:        General: Normal range of motion.     Cervical back: Normal range of motion.  Neurological:     General: No focal deficit present.     Mental Status: He is alert and oriented to person, place, and time.  Psychiatric:        Attention and Perception: Attention and perception normal.        Mood and Affect: Mood is anxious.        Speech: Speech normal.        Behavior: Behavior normal. Behavior is cooperative.        Thought Content: Thought content normal.        Cognition and Memory: Cognition and memory normal.        Judgment: Judgment normal.     Comments: About discharge Patient stated " I am just nervous about going home" my blood pressure is fine denies chest pain/sob headache    Review of Systems  All other systems reviewed and are negative.  Blood pressure (!) 145/112, pulse 80, temperature 97.9 F (36.6 C), resp. rate 20, height 5\' 7"  (1.702 m), weight 87.1 kg, SpO2 97%. Body mass index is 30.07 kg/m.  Mental Status Per Nursing Assessment::   On Admission:  Thoughts of violence towards others  Demographic Factors:  Male, Caucasian, Low socioeconomic status, and Unemployed  Loss Factors: NA  Historical Factors: Family history of mental illness or substance abuse  Risk Reduction Factors:   Sense of responsibility to family, Living with another person, especially a relative, Positive social support, Positive therapeutic relationship, and Positive coping skills or problem solving skills  Continued Clinical Symptoms:  Panic Attacks Depression:   Insomnia Medical Diagnoses and Treatments/Surgeries  Cognitive Features That Contribute To Risk:  None    Suicide  Risk:  Minimal: No identifiable suicidal ideation.  Patients presenting with no risk factors but with morbid ruminations; may be classified as minimal risk based on the severity of the depressive symptoms   Follow-up Information     [facility], [facility]. Go to.   Why:  Appointment scedualed for 01/08/33 at 9AM. Please bring discharge paperwork with you to appointment. Contact information: [address] [place] [address] [phone]                 Plan Of Care/Follow-up recommendations:  Activity:  as tolerated Diet:  heart healthy Gabapentin 300 mg TID: For anxiety and withdrawal management. Quetiapine 100 mg nightly: To stabilize mood and improve sleep. Olanzapine (Zyprexa) 7.5 mg nightly: To address intrusive thoughts and stabilize mood. Propranolol 10 mg BID PRN: For tremors and irritability associated with withdrawal. National Suicide Prevention Lifeline: 1-800-273-TALK 843-011-0240) Crisis Text Line: Text HOME to 872 456 0813 Scheduled follow-up for continued mental health care and medication management. January 08, 2033, at 9:00 AM [address] [place], [place] [address] Phone: [phone]  Myriam Forehand, NP 01/05/2023, 4:15 PM

## 2023-01-05 NOTE — Progress Notes (Signed)
   01/04/23 2100  Psych Admission Type (Psych Patients Only)  Admission Status Voluntary  Psychosocial Assessment  Patient Complaints Anxiety  Eye Contact Fair  Facial Expression Anxious  Affect Apprehensive;Preoccupied;Anxious  Speech Logical/coherent  Interaction Assertive;Needy  Motor Activity Slow  Appearance/Hygiene Unremarkable  Behavior Characteristics Cooperative;Appropriate to situation  Mood Pleasant;Anxious  Thought Process  Coherency WDL  Content WDL  Delusions None reported or observed  Perception WDL  Hallucination None reported or observed  Judgment WDL  Confusion WDL  Danger to Self  Current suicidal ideation? Denies  Agreement Not to Harm Self Yes  Description of Agreement verbal  Danger to Others  Danger to Others None reported or observed   Patient is restless and anxious c/o chest pain he appeared flushed, elevated blood pressure, writer did an EKG on patient it was NSR, he ws already given blood pressure medications, 15 minutes safety checks maintained will continue to monitor.

## 2023-01-05 NOTE — Group Note (Signed)
Date:  01/05/2023 Time:  5:22 PM  Group Topic/Focus:  Rediscovering Joy:   The focus of this group is to explore various ways to relieve stress in a positive manner.    Participation Level:  Did Not Attend   Lynelle Smoke [facility] 01/05/2023, 5:22 PM

## 2023-01-05 NOTE — Group Note (Signed)
Date:  01/05/2023 Time:  11:16 AM  Group Topic/Focus:  Goals Group:   The focus of this group is to help patients establish daily goals to achieve during treatment and discuss how the patient can incorporate goal setting into their daily lives to aide in recovery.    Participation Level:  Active  Participation Quality:  Appropriate  Affect:  Appropriate  Cognitive:  Appropriate  Insight: Appropriate  Engagement in Group:  Engaged  Modes of Intervention:  Activity  Additional Comments:    Ronald [name] 01/05/2023, 11:16 AM

## 2023-01-05 NOTE — Progress Notes (Signed)
Assumed care of patient this am, he present A&O x 3, affect and mood anxious and he reports not sleeping well last evening. Pt initially stated he wanted to leave today and go home, then became more anxious and reported racing thoughts and his BP has been unstable and  provider stated she will continue to monitor and consider discharge on tomorrow. Pt out in the milieu, sociable at times and at other, times noted been isolative to his room and being anxious. behavior  appropriate to situation and was without further complaints. Pt denied thoughts, plan or intent to harm self or others and made a safety commitment, Pt is compliant with taking medications and treatment plan. Pt educated on plan of care, medication regimen and he acknowledged an understanding and was without further concerns.

## 2023-01-05 NOTE — Group Note (Signed)
Date:  01/05/2023 Time:  4:18 AM  Group Topic/Focus:  Wrap-Up Group:   The focus of this group is to help patients review their daily goal of treatment and discuss progress on daily workbooks.    Participation Level:  Minimal  Participation Quality:  Appropriate and Attentive  Affect:  Appropriate  Cognitive:  Alert  Insight: Good and Limited  Engagement in Group:  Improving  Modes of Intervention:  Discussion  Additional Comments:     Maglione,Aybree Lanyon E 01/05/2023, 4:18 AM

## 2023-01-06 DIAGNOSIS — F13930 Sedative, hypnotic or anxiolytic use, unspecified with withdrawal, uncomplicated: Secondary | ICD-10-CM | POA: Diagnosis not present

## 2023-01-06 MED ORDER — OLANZAPINE 10 MG PO TBDP
10.0000 mg | ORAL_TABLET | Freq: Once | ORAL | Status: AC
  Administered 2023-01-06: 10 mg via ORAL
  Filled 2023-01-06: qty 1

## 2023-01-06 NOTE — Plan of Care (Signed)
  Problem: Education: Goal: Ability to state activities that reduce stress will improve Outcome: Progressing   Problem: Coping: Goal: Ability to identify and develop effective coping behavior will improve Outcome: Progressing   Problem: Self-Concept: Goal: Ability to identify factors that promote anxiety will improve Outcome: Progressing   Problem: Self-Concept: Goal: Level of anxiety will decrease Outcome: Progressing

## 2023-01-06 NOTE — Progress Notes (Signed)
Ronald [name] continues to question if he could still be withdrawing from benzodiazepines and continues to insist the medications we are giving him are not helping with his anxiety/agitation/irritability. He states, "I dont' want to hurt anyone, I don't want to hurt myself, but I also feel like I am losing control like some of the other patients." Verbal support and encouragement was given. Education provided to [place] on the need for development of coping skills as well as medication management. He was receptive to this education and feedback.

## 2023-01-06 NOTE — Plan of Care (Signed)
  Problem: Education: Goal: Ability to state activities that reduce stress will improve Outcome: Progressing   Problem: Coping: Goal: Ability to identify and develop effective coping behavior will improve Outcome: Progressing   

## 2023-01-06 NOTE — Progress Notes (Signed)
D: Patient alert and oriented, able to make needs known. Denies SI/HI, AVH at present. Rates pain 7/10 in his foot from "walking a lot yesterday and pacing." Patient goal today "keep on keeping on stop giving up so easily." Rates depression 8/10, hopelessness 7/10, and anxiety 9/10. Patient reports energy level as poor. He reports he slept poor last night. Patient has been concentrated on his anxiety this shift, and he continues to come to the window stating he needs something else for his anxious and racing thoughts. He is visibly trembling at times- NP saw him and stated she would give him a Prn order of Zydis.   A: Scheduled medications administered to patient per MD order. Support and encouragement provided. Routine safety checks conducted every fifteen minutes. Patient informed to notify staff with problems or concerns. Frequent verbal contact made.   R: No adverse drug reactions noted. Patient contracts for safety at this time. Patient is compliant with medications and treatment plan. Patient receptive, calm and cooperative but still visibly anxious. Patient interacts with others appropriately on unit at present. Patient remains safe at present.

## 2023-01-06 NOTE — Progress Notes (Signed)
[facility] MD Progress Note  01/06/2023 5:29 PM Ronald [name].  MRN:  [id number] Subjective:  47 year old Caucasian male reports, "I'm not ready to go home."Intrusive thoughts and an inability to stop thinking, contributing to emotional distress. Denies suicidal ideation (SI), homicidal ideation (HI), or delusions. Principal Problem: Major depressive disorder, recurrent severe without psychotic features (HCC) Diagnosis: Principal Problem:   Major depressive disorder, recurrent severe without psychotic features (HCC) Active Problems:   Benzodiazepine withdrawal (HCC)   Generalized anxiety disorder  Total Time spent with patient: 2 hours  Past Psychiatric History:  Benzodiazepine withdrawal Generalized anxiety disorder  Past Medical History:  Past Medical History:  Diagnosis Date   Anxiety    Depression    Family history of adverse reaction to anesthesia    difficult to wake up - Mother   GERD (gastroesophageal reflux disease)    PTSD (post-traumatic stress disorder)     Past Surgical History:  Procedure Laterality Date   ELECTROCONVULSIVE THERAPY     FRACTURE SURGERY     wrist left   INSERTION OF MESH  08/25/2021   Procedure: INSERTION OF MESH;  Surgeon: Sung Amabile, DO;  Location: [facility] ORS;  Service: General;;   UMBILICAL HERNIA REPAIR  08/25/2021   Procedure: HERNIA REPAIR UMBILICAL ADULT;  Surgeon: Sung Amabile, DO;  Location: [facility] ORS;  Service: General;;   Family History: History reviewed. No pertinent family history. Family Psychiatric  History: none reported Social History:  Social History   Substance and Sexual Activity  Alcohol Use Yes   Comment: occasional     Social History   Substance and Sexual Activity  Drug Use No    Social History   Socioeconomic History   Marital status: Single    Spouse name: Not on file   Number of children: Not on file   Years of education: Not on file   Highest education level: Not on file  Occupational History   Not on file   Tobacco Use   Smoking status: Every Day    Current packs/day: 1.00    Types: Cigarettes   Smokeless tobacco: Never  Vaping Use   Vaping status: Every Day  Substance and Sexual Activity   Alcohol use: Yes    Comment: occasional   Drug use: No   Sexual activity: Not on file  Other Topics Concern   Not on file  Social History Narrative   Not on file   Social Determinants of Health   Financial Resource Strain: Medium Risk (12/10/2022)   Received from [facility]   Overall Financial Resource Strain (CARDIA)    Difficulty of Paying Living Expenses: Somewhat hard  Food Insecurity: No Food Insecurity (12/26/2022)   Hunger Vital Sign    Worried About Running Out of Food in the Last Year: Never true    Ran Out of Food in the Last Year: Never true  Transportation Needs: No Transportation Needs (12/26/2022)   PRAPARE - Administrator, Civil Service (Medical): No    Lack of Transportation (Non-Medical): No  Physical Activity: Not on file  Stress: Not on file  Social Connections: Not on file   Additional Social History:                         Sleep: Fair  Appetite:  Good  Current Medications: Current Facility-Administered Medications  Medication Dose Route Frequency Provider Last Rate Last Admin   acetaminophen (TYLENOL) tablet 650 mg  650 mg  Oral Q6H PRN Lauree Chandler, NP   650 mg at 01/06/23 0833   alum & mag hydroxide-simeth (MAALOX/MYLANTA) 200-200-20 MG/5ML suspension 30 mL  30 mL Oral Q4H PRN Lauree Chandler, NP   30 mL at 12/29/22 1702   amLODipine (NORVASC) tablet 5 mg  5 mg Oral Daily Charm Rings, NP   5 mg at 01/06/23 0825   escitalopram (LEXAPRO) tablet 10 mg  10 mg Oral Daily Charm Rings, NP   10 mg at 01/06/23 0825   gabapentin (NEURONTIN) capsule 300 mg  300 mg Oral TID Myriam Forehand, NP   300 mg at 01/06/23 1223   hydrOXYzine (ATARAX) tablet 50 mg  50 mg Oral TID PRN Myriam Forehand, NP   50 mg at 01/06/23 1610   magnesium  hydroxide (MILK OF MAGNESIA) suspension 30 mL  30 mL Oral Daily PRN Lauree Chandler, NP       multivitamin with minerals tablet 1 tablet  1 tablet Oral Daily Charm Rings, NP   1 tablet at 01/06/23 0825   nicotine (NICODERM CQ - dosed in mg/24 hours) patch 21 mg  21 mg Transdermal Daily Charm Rings, NP   21 mg at 01/06/23 9604   nicotine polacrilex (NICORETTE) gum 2 mg  2 mg Oral PRN Charm Rings, NP   2 mg at 01/06/23 1305   propranolol (INDERAL) tablet 10 mg  10 mg Oral Daily Myriam Forehand, NP   10 mg at 01/06/23 0825   QUEtiapine (SEROQUEL) tablet 200 mg  200 mg Oral QHS Myriam Forehand, NP   200 mg at 01/05/23 2112   thiamine (VITAMIN B1) tablet 100 mg  100 mg Oral Daily Charm Rings, NP   100 mg at 01/06/23 0825   traZODone (DESYREL) tablet 50 mg  50 mg Oral QHS PRN Lauree Chandler, NP   50 mg at 01/05/23 2112    Lab Results: No results found for this or any previous visit (from the past 48 hour(s)).  Blood Alcohol level:  Lab Results  Component Value Date   ETH <10 12/26/2022   ETH 196 (H) 09/08/2018    Metabolic Disorder Labs: Lab Results  Component Value Date   HGBA1C 5.2 12/30/2022   MPG 102.54 12/30/2022   No results found for: "PROLACTIN" Lab Results  Component Value Date   CHOL 145 12/30/2022   TRIG 249 (H) 12/30/2022   HDL 27 (L) 12/30/2022   CHOLHDL 5.4 12/30/2022   VLDL 50 (H) 12/30/2022   LDLCALC 68 12/30/2022    Physical Findings: AIMS:  , ,  ,  ,    CIWA:  CIWA-Ar Total: 3 COWS:     Musculoskeletal: Strength & Muscle Tone: within normal limits Gait & Station: normal Patient leans: N/A  Psychiatric Specialty Exam:  Presentation  General Appearance:  Appropriate for Environment; Neat  Eye Contact: Good  Speech: Clear and Coherent  Speech Volume: Normal  Handedness: Right   Mood and Affect  Mood: Irritable; Anxious; Labile  Affect: Labile   Thought Process  Thought Processes: Coherent  Descriptions of  Associations:Intact  Orientation:Full (Time, Place and Person) (and situation)  Thought Content:Obsessions; Scattered  History of Schizophrenia/Schizoaffective disorder:No  Duration of Psychotic Symptoms:none noted Hallucinations:Hallucinations: None  Ideas of Reference:None  Suicidal Thoughts:Suicidal Thoughts: No  Homicidal Thoughts:Homicidal Thoughts: No   Sensorium  Memory: Immediate Good; Remote Good  Judgment: Poor  Insight: Poor   Executive Functions  Concentration: Poor  Attention Span: Fair  Recall: Good  Fund of Knowledge: Good  Language: Good   Psychomotor Activity  Psychomotor Activity: Psychomotor Activity: Normal   Assets  Assets: Communication Skills; Housing; Financial Resources/Insurance   Sleep  Sleep: Sleep: Fair Number of Hours of Sleep: 6    Physical Exam: Physical Exam Vitals and nursing note reviewed.  Constitutional:      Appearance: Normal appearance.  HENT:     Head: Normocephalic and atraumatic.     Nose: Nose normal.  Pulmonary:     Effort: Pulmonary effort is normal.  Musculoskeletal:        General: Normal range of motion.     Cervical back: Normal range of motion.  Neurological:     General: No focal deficit present.     Mental Status: He is alert and oriented to person, place, and time. Mental status is at baseline.  Psychiatric:        Attention and Perception: Attention and perception normal.        Mood and Affect: Mood is anxious. Affect is labile.        Speech: Speech normal.        Behavior: Behavior normal. Behavior is cooperative.        Thought Content: Thought content normal.        Cognition and Memory: Cognition and memory normal.        Judgment: Judgment is impulsive.    Review of Systems  Neurological:  Positive for tremors.  Psychiatric/Behavioral:  The patient is nervous/anxious.   All other systems reviewed and are negative.  Blood pressure (!) 131/96, pulse 86, temperature  97.7 F (36.5 C), temperature source Oral, resp. rate 19, height 5\' 7"  (1.702 m), weight 87.1 kg, SpO2 95%. Body mass index is 30.07 kg/m.   Treatment Plan Summary: Daily contact with patient to assess and evaluate symptoms and progress in treatment and Medication management To manage acute intrusive thoughts or emotional dysregulation that may hinder patient stabilization.  10 mg Zydis tablet under the tongue. Gabapentin 300 mg TID: For anxiety and withdrawal management, titrating as needed based on response. Quetiapine 100 mg nightly: To address labile mood and reported auditory disturbances. Escitalopram 10 mg daily: For depressive symptoms Amlodipine 5 mg daily: For hypertension Myriam Forehand, NP Myriam Forehand, NP 01/06/2023, 5:29 PM

## 2023-01-06 NOTE — Group Note (Signed)
Date:  01/06/2023 Time:  10:27 AM  Group Topic/Focus:  Spirituality:   The focus of this group is to discuss how one's spirituality can aide in recovery.    Participation Level:  Did Not Attend   Ronald [name] 01/06/2023, 10:27 AM

## 2023-01-06 NOTE — Group Note (Deleted)
Date:  01/06/2023 Time:  3:00 PM  Group Topic/Focus:  Healthy Communication:   The focus of this group is to discuss communication, barriers to communication, as well as healthy ways to communicate with others. MoviesTherapy encourages emotional release Even those who often have trouble expressing their emotions might find themselves laughing or crying during a film. This release of emotions can have a cathartic effect and also make it easier for a person to become more comfortable in expressing their emotions.     Participation Level:  {BHH PARTICIPATION ZHYQM:57846}  Participation Quality:  {BHH PARTICIPATION QUALITY:22265}  Affect:  {BHH AFFECT:22266}  Cognitive:  {BHH COGNITIVE:22267}  Insight: {BHH Insight2:20797}  Engagement in Group:  {BHH ENGAGEMENT IN GROUP:22268}  Modes of Intervention:  {BHH MODES OF INTERVENTION:22269}  Additional Comments:  ***  Ronald [name] 01/06/2023, 3:00 PM

## 2023-01-06 NOTE — Group Note (Signed)
Date:  01/06/2023 Time:  3:19 PM  Group Topic/Focus:  Activity Group:  The focus of the group is to promote activity for the patients and encourage them to come outside in the courtyard and get some fresh air and some exercise.    Participation Level:  Active  Participation Quality:  Appropriate  Affect:  Appropriate  Cognitive:  Appropriate  Insight: Appropriate  Engagement in Group:  Engaged  Modes of Intervention:  Activity  Additional Comments:    Ronald [name] 01/06/2023, 3:19 PM

## 2023-01-06 NOTE — Group Note (Signed)
Date:  01/06/2023 Time:  9:46 PM  Group Topic/Focus:  Wrap-Up Group:   The focus of this group is to help patients review their daily goal of treatment and discuss progress on daily workbooks.    Participation Level:  Active  Participation Quality:  Appropriate and Attentive  Affect:  Appropriate  Cognitive:  Appropriate  Insight: Appropriate and Good  Engagement in Group:  Supportive  Modes of Intervention:  Activity  Additional Comments:     [name] 01/06/2023, 9:46 PM

## 2023-01-06 NOTE — Progress Notes (Signed)
   01/05/23 2000  Psych Admission Type (Psych Patients Only)  Admission Status Voluntary  Psychosocial Assessment  Patient Complaints Anxiety  Eye Contact Fair  Facial Expression Anxious  Affect Apprehensive;Preoccupied;Anxious  Speech Logical/coherent  Interaction Assertive;Needy  Motor Activity Slow  Appearance/Hygiene Unremarkable  Behavior Characteristics Cooperative;Appropriate to situation  Mood Pleasant  Thought Process  Coherency WDL  Content WDL  Delusions None reported or observed  Perception WDL  Hallucination None reported or observed  Judgment WDL  Confusion WDL  Danger to Self  Current suicidal ideation? Denies  Agreement Not to Harm Self Yes  Description of Agreement verbal  Danger to Others  Danger to Others None reported or observed   Patient appears less anxious, he denies SI/HI/AVH no distress noted, 15 minutes safety checks maintained will continue to monitor.

## 2023-01-07 DIAGNOSIS — F13930 Sedative, hypnotic or anxiolytic use, unspecified with withdrawal, uncomplicated: Secondary | ICD-10-CM | POA: Diagnosis not present

## 2023-01-07 NOTE — BHH Suicide Risk Assessment (Signed)
[facility] Discharge Suicide Risk Assessment   Principal Problem: Major depressive disorder, recurrent severe without psychotic features [facility]) Discharge Diagnoses: Principal Problem:   Major depressive disorder, recurrent severe without psychotic features (HCC) Active Problems:   Benzodiazepine withdrawal (HCC)   Generalized anxiety disorder   Total Time spent with patient: 1 hour  Musculoskeletal: Strength & Muscle Tone: within normal limits Gait & Station: normal Patient leans: N/A  Psychiatric Specialty Exam  Presentation  General Appearance:  Appropriate for Environment; Neat  Eye Contact: Good  Speech: Clear and Coherent  Speech Volume: Normal  Handedness: Right   Mood and Affect  Mood: Irritable; Anxious; Labile  Duration of Depression Symptoms: Greater than two weeks  Affect: Labile   Thought Process  Thought Processes: Coherent  Descriptions of Associations:Intact  Orientation:Full (Time, Place and Person) (and situation)  Thought Content:Obsessions; Scattered  History of Schizophrenia/Schizoaffective disorder:No  Duration of Psychotic Symptoms:No data recorded Hallucinations:Hallucinations: None  Ideas of Reference:None  Suicidal Thoughts:Suicidal Thoughts: No  Homicidal Thoughts:Homicidal Thoughts: No   Sensorium  Memory: Immediate Good; Remote Good  Judgment: Poor  Insight: Poor   Executive Functions  Concentration: Poor  Attention Span: Fair  Recall: Good  Fund of Knowledge: Good  Language: Good   Psychomotor Activity  Psychomotor Activity: Psychomotor Activity: Normal   Assets  Assets: Communication Skills; Housing; Financial Resources/Insurance   Sleep  Sleep: Sleep: Fair Number of Hours of Sleep: 6   Physical Exam: Physical Exam Vitals and nursing note reviewed.  Constitutional:      Appearance: Normal appearance.  HENT:     Head: Normocephalic and atraumatic.     Nose: Nose normal.   Pulmonary:     Effort: Pulmonary effort is normal.  Musculoskeletal:        General: Normal range of motion.     Cervical back: Normal range of motion.  Neurological:     General: No focal deficit present.     Mental Status: He is alert and oriented to person, place, and time. Mental status is at baseline.  Psychiatric:        Attention and Perception: Attention and perception normal.        Mood and Affect: Mood is anxious.        Speech: He is noncommunicative.        Behavior: Behavior normal. Behavior is cooperative.        Thought Content: Thought content normal.        Cognition and Memory: Cognition and memory normal.     Comments: About discharge    ROS Blood pressure (!) 157/97, pulse 72, temperature (!) 97.3 F (36.3 C), resp. rate 18, height 5\' 7"  (1.702 m), weight 87.1 kg, SpO2 97%. Body mass index is 30.07 kg/m.  Mental Status Per Nursing Assessment::   On Admission:  Thoughts of violence towards others  Demographic Factors:  Male, Caucasian, Low socioeconomic status, and Unemployed  Loss Factors: NA  Historical Factors: Family history of mental illness or substance abuse and Impulsivity  Risk Reduction Factors:   Responsible for children under 35 years of age, Sense of responsibility to family, Living with another person, especially a relative, Positive social support, Positive therapeutic relationship, and Positive coping skills or problem solving skills  Continued Clinical Symptoms:  Panic Attacks Depression:   Comorbid alcohol abuse/dependence Insomnia Alcohol/Substance Abuse/Dependencies Medical Diagnoses and Treatments/Surgeries  Cognitive Features That Contribute To Risk:  None    Suicide Risk:  Minimal: No identifiable suicidal ideation.  Patients presenting with no  risk factors but with morbid ruminations; may be classified as minimal risk based on the severity of the depressive symptoms   Follow-up Information     [facility]. Go to.   Why: Appointment scedualed for 01/08/33 at 9AM. Please bring discharge paperwork with you to appointment. Contact information: [address] [place] [address] [phone]                 Plan Of Care/Follow-up recommendations:  Activity:  as tolerated Diet:  heart healthy Gabapentin 300 mg TID: For anxiety and withdrawal management. Quetiapine 100 mg nightly: To stabilize mood and improve sleep. Olanzapine (Zyprexa) 7.5 mg nightly: To address intrusive thoughts and stabilize mood. Propranolol 10 mg BID PRN: For tremors and irritability associated with withdrawal. National Suicide Prevention Lifeline: 1-800-273-TALK 351-208-7401) Crisis Text Line: Text HOME to 706-483-9229 Scheduled follow-up for continued mental health care and medication management. January 08, 2033, at 9:00 AM [address] [place], [place] [address] Phone: [phone] Myriam Forehand, NP 01/07/2023, 11:07 AM

## 2023-01-07 NOTE — Progress Notes (Signed)
Discharge Note:  Patient denies SI/HI/AVH at this time. Discharge instructions, AVS, prescriptions, and transition record reviewed with patient. Patient agrees to comply with medication management, follow-up visit, and outpatient therapy. Patient belongings returned to patient. Patient questions and concerns addressed and answered. Patient ambulatory off unit. Patient discharged to home accompanied by mother.

## 2023-01-07 NOTE — Group Note (Signed)
Recreation Therapy Group Note   Group Topic:Coping Skills  Group Date: 01/07/2023 Start Time: 1010 End Time: 1105 Facilitators: Rosina Lowenstein, LRT, CTRS Location:  Craft Room  Group Description: Mind Map.  Patient was provided a blank template of a diagram with 32 blank boxes in a tiered system, branching from the center (similar to a bubble chart). LRT directed patients to label the middle of the diagram "Coping Skills". LRT and patients then came up with 8 different coping skills as examples. Pt were directed to record their coping skills in the 2nd tier boxes closest to the center.  Patients would then share their coping skills with the group as LRT wrote them out. LRT gave a handout of 99 different coping skills at the end of group.   Goal Area(s) Addressed: Patients will be able to define "coping skills". Patient will identify new coping skills.  Patient will increase communication.   Affect/Mood: Full range   Participation Level: Moderate    Clinical Observations/Individualized Feedback: Arrie came late to group. Pt was in and out of group multiple times. Pt was observed to be walking around in the back of the room most of the time.   Plan: Continue to engage patient in RT group sessions 2-3x/week.   Rosina Lowenstein, LRT, CTRS 01/07/2023 12:29 PM

## 2023-01-07 NOTE — Group Note (Signed)
Date:  01/07/2023 Time:  10:29 AM  Group Topic/Focus:  Dimensions of Wellness:   The focus of this group is to introduce the topic of wellness and discuss the role each dimension of wellness plays in total health. Goals Group:   The focus of this group is to help patients establish daily goals to achieve during treatment and discuss how the patient can incorporate goal setting into their daily lives to aide in recovery. Self Care:   The focus of this group is to help patients understand the importance of self-care in order to improve or restore emotional, physical, spiritual, interpersonal, and financial health.    Participation Level:  Active  Participation Quality:  Appropriate  Affect:  Appropriate  Cognitive:  Appropriate  Insight: Appropriate  Engagement in Group:  Developing/Improving, Engaged, and Supportive  Modes of Intervention:  Activity, Discussion, and Socialization  Additional Comments:    Ronald [name] 01/07/2023, 10:29 AM

## 2023-01-07 NOTE — Plan of Care (Signed)
  Problem: Education: Goal: Ability to state activities that reduce stress will improve Outcome: Progressing   Problem: Coping: Goal: Ability to identify and develop effective coping behavior will improve Outcome: Progressing   Problem: Self-Concept: Goal: Ability to identify factors that promote anxiety will improve Outcome: Progressing

## 2023-01-07 NOTE — Discharge Summary (Cosign Needed Addendum)
Physician Discharge Summary Note  Patient:  Ronald [name]. is an 47 y.o., male MRN:  [id number] DOB:  08/30/1975 Patient phone:  [phone] (home)  Patient address:   [address] [place] [address],  Total Time spent with patient: 2.5 hours  Date of Admission:  12/26/2022 Date of Discharge: 01/07/2023  Reason for Admission:   47 year old Caucasian male, was admitted for benzodiazepine withdrawal and severe emotional distress. He presented with significant symptoms, including:A long history of Klonopin use (both prescribed and unprescribed) for 9 years, with his last dose taken on 11/12 and Valium on the prior Saturday.Symptoms of withdrawal included tremors, anxiety, and emotional dysregulation.Reported severe anxiety with 3-4 panic attacks daily, characterized by chest tightness, elevated blood pressure, and feelings of imminent death.Severe depression with intrusive thoughts of self-harm and feelings of hopelessness.Intrusive Thoughts and Emotional Distress:The patient expressed distressing intrusive thoughts, including harming himself and others, though he denied active suicidal or homicidal ideation.Tearfulness and significant emotional dysregulation contributed to the need for inpatient stabilization.Difficulty maintaining employment due to severe anxiety and fear of losing control around others.Poor sleep, loss of appetite, and weight loss exacerbated by depressive symptoms.  Principal Problem: Benzodiazepine withdrawal (HCC) Discharge Diagnoses: Principal Problem:   Benzodiazepine withdrawal (HCC) Active Problems:   Generalized anxiety disorder   Major depressive disorder, recurrent severe without psychotic features (HCC)   Past Psychiatric History:  Benzodiazepine withdrawal Major depressive disorder, recurrent severe without psychotic features (HCC)  Past Medical History:  Past Medical History:  Diagnosis Date   Anxiety    Depression    Family history of adverse  reaction to anesthesia    difficult to wake up - Mother   GERD (gastroesophageal reflux disease)    PTSD (post-traumatic stress disorder)     Past Surgical History:  Procedure Laterality Date   ELECTROCONVULSIVE THERAPY     FRACTURE SURGERY     wrist left   INSERTION OF MESH  08/25/2021   Procedure: INSERTION OF MESH;  Surgeon: Sung Amabile, DO;  Location: [facility] ORS;  Service: General;;   UMBILICAL HERNIA REPAIR  08/25/2021   Procedure: HERNIA REPAIR UMBILICAL ADULT;  Surgeon: Sung Amabile, DO;  Location: [facility] ORS;  Service: General;;   Family History: History reviewed. No pertinent family history. Family Psychiatric  History: none reported Social History:  Social History   Substance and Sexual Activity  Alcohol Use Yes   Comment: occasional     Social History   Substance and Sexual Activity  Drug Use No    Social History   Socioeconomic History   Marital status: Single    Spouse name: Not on file   Number of children: Not on file   Years of education: Not on file   Highest education level: Not on file  Occupational History   Not on file  Tobacco Use   Smoking status: Every Day    Current packs/day: 1.00    Types: Cigarettes   Smokeless tobacco: Never  Vaping Use   Vaping status: Every Day  Substance and Sexual Activity   Alcohol use: Yes    Comment: occasional   Drug use: No   Sexual activity: Not on file  Other Topics Concern   Not on file  Social History Narrative   Not on file   Social Determinants of Health   Financial Resource Strain: Medium Risk (12/10/2022)   [facility]   Overall Financial Resource Strain (CARDIA)    Difficulty of Paying Living Expenses: Somewhat hard  Food Insecurity: No Food Insecurity (12/26/2022)   Hunger Vital Sign    Worried About Running Out of Food in the Last Year: Never true    Ran Out of Food in the Last Year: Never true  Transportation Needs: No Transportation Needs (12/26/2022)   PRAPARE -  Administrator, Civil Service (Medical): No    Lack of Transportation (Non-Medical): No  Physical Activity: Not on file  Stress: Not on file  Social Connections: Not on file    Hospital Course:  47 year old Caucasian male with a history of major depressive disorder (MDD), anxiety, PTSD, and insomnia, was admitted for benzodiazepine withdrawal and severe emotional distress. His hospitalization focused on withdrawal management, stabilization of depressive and anxiety symptoms, and addressing intrusive thoughts.Severe depression, intrusive thoughts of self-harm, and distressing feelings of losing control.History of long-term benzodiazepine use, with recent withdrawal symptoms, including tremors, anxiety, and panic attacks.Tearfulness, poor sleep, and significant weight loss. Panic attacks occurring 3-4 times daily, characterized by chest tightness and feelings of a heart attack. The patient denied active suicidal ideation (SI), homicidal ideation (HI), hallucinations, or paranoia but was emotionally distressed, tearful, and preoccupied with intrusive thoughts.Seroquel (Quetiapine): Increased to manage mood stabilization and intrusive thoughts. Clonidine: Initiated to manage withdrawal symptoms and reduce anxiety. Multivitamin and appetite-stimulating nutritional support to address weight loss and loss of appetite. Benzodiazepines were not reintroduced, given the history of dependency.Individual therapy sessions to address intrusive thoughts, emotional dysregulation, and coping strategies. Crisis intervention planning to ensure patient safety during emotional distress. Family support initiated to coordinate care and ensure understanding of discharge planning. Monitored closely for signs of benzodiazepine withdrawal, including tremors and anxiety. Provided education about withdrawal symptoms and coping mechanisms. Initially withdrawn and tearful, the patient gradually engaged in therapeutic  activities. Continued to report intrusive thoughts but demonstrated improved emotional regulation and mood stabilization over time.The patient denied SI, HI, delusions, or hallucinations throughout hospitalization. Anxiety and depression improved moderately with medication adjustments. Panic attacks decreased in frequency and severity.The patient expressed concerns about readiness for discharge, stating, "I'm not ready to go home," but demonstrated stability in mood and behavior.The patient was provided a 30-day supply of medications and instructions for continued care.Crisis resources and follow-up appointments with outpatient psychiatric and therapeutic services were arranged.Support for Medicaid enrollment and ongoing family involvement was emphasized to ensure safety and continuity of care.The patient was discharged in stable condition with a comprehensive outpatient plan in place.          Musculoskeletal: Strength & Muscle Tone: within normal limits Gait & Station: normal Patient leans: N/A   Psychiatric Specialty Exam:  Presentation  General Appearance:  Appropriate for Environment; Neat  Eye Contact: Good  Speech: Clear and Coherent  Speech Volume: Normal  Handedness: Right   Mood and Affect  Mood: Irritable; Anxious; Labile  Affect: Labile   Thought Process  Thought Processes: Coherent  Descriptions of Associations:Intact  Orientation:Full (Time, Place and Person) (and situation)  Thought Content:Obsessions; Scattered  History of Schizophrenia/Schizoaffective disorder:No  Duration of Psychotic Symptoms:No data recorded Hallucinations:Hallucinations: None  Ideas of Reference:None  Suicidal Thoughts:Suicidal Thoughts: No  Homicidal Thoughts:Homicidal Thoughts: No   Sensorium  Memory: Immediate Good; Remote Good  Judgment: Poor  Insight: Poor   Executive Functions  Concentration: Poor  Attention  Span: Fair  Recall: Good  Fund of Knowledge: Good  Language: Good   Psychomotor Activity  Psychomotor Activity: Psychomotor Activity: Normal   Assets  Assets: Communication Skills; Housing; Financial Resources/Insurance   Sleep  Sleep: Sleep: Fair Number of Hours of Sleep: 6    Physical Exam: Physical Exam Vitals and nursing note reviewed.  Constitutional:      Appearance: Normal appearance.  HENT:     Head: Normocephalic and atraumatic.     Nose: Nose normal.  Pulmonary:     Effort: Pulmonary effort is normal.  Musculoskeletal:        General: Normal range of motion.     Cervical back: Normal range of motion.  Neurological:     General: No focal deficit present.     Mental Status: He is alert. Mental status is at baseline.  Psychiatric:        Attention and Perception: Attention and perception normal.        Mood and Affect: Affect normal. Mood is anxious.        Speech: Speech normal.        Behavior: Behavior normal. Behavior is cooperative.        Thought Content: Thought content normal.        Cognition and Memory: Cognition and memory normal.     Comments: About discharge   Review of Systems  All other systems reviewed and are negative.  Blood pressure (!) 157/97, pulse 72, temperature (!) 97.3 F (36.3 C), resp. rate 18, height 5\' 7"  (1.702 m), weight 87.1 kg, SpO2 97%. Body mass index is 30.07 kg/m.   Social History   Tobacco Use  Smoking Status Every Day   Current packs/day: 1.00   Types: Cigarettes  Smokeless Tobacco Never   Tobacco Cessation:  A prescription for an FDA-approved tobacco cessation medication provided at discharge   Blood Alcohol level:  Lab Results  Component Value Date   ETH <10 12/26/2022   ETH 196 (H) 09/08/2018    Metabolic Disorder Labs:  Lab Results  Component Value Date   HGBA1C 5.2 12/30/2022   MPG 102.54 12/30/2022   No results found for: "PROLACTIN" Lab Results  Component Value Date   CHOL  145 12/30/2022   TRIG 249 (H) 12/30/2022   HDL 27 (L) 12/30/2022   CHOLHDL 5.4 12/30/2022   VLDL 50 (H) 12/30/2022   LDLCALC 68 12/30/2022    See Psychiatric Specialty Exam and Suicide Risk Assessment completed by Attending Physician prior to discharge.  Discharge destination:  Home  Is patient on multiple antipsychotic therapies at discharge:  No   Has Patient had three or more failed trials of antipsychotic monotherapy by history:  No  Recommended Plan for Multiple Antipsychotic Therapies: NA   Allergies as of 01/07/2023       Reactions   Buspar [buspirone]         Medication List     STOP taking these medications    acetaminophen 325 MG tablet Commonly known as: TYLENOL   ALPRAZolam 0.5 MG tablet Commonly known as: XANAX   amLODipine 5 MG tablet Commonly known as: NORVASC   clonazePAM 0.5 MG tablet Commonly known as: KLONOPIN   famotidine 20 MG tablet Commonly known as: PEPCID   HYDROcodone-acetaminophen 5-325 MG tablet Commonly known as: NORCO/VICODIN   hydrOXYzine 50 MG capsule Commonly known as: VISTARIL   ibuprofen 800 MG tablet Commonly known as: ADVIL   meloxicam 15 MG tablet Commonly known as: MOBIC   methylPREDNISolone 4 MG Tbpk tablet Commonly known as: MEDROL DOSEPAK   nicotine 14 mg/24hr patch Commonly known as: NICODERM CQ - dosed in mg/24 hours Replaced by: nicotine 21 mg/24hr patch   sertraline 50 MG tablet  Commonly known as: ZOLOFT       TAKE these medications      Indication  escitalopram 10 MG tablet Commonly known as: LEXAPRO Take 1 tablet (10 mg total) by mouth daily.  Indication: Major Depressive Disorder   gabapentin 300 MG capsule Commonly known as: NEURONTIN Take 1 capsule (300 mg total) by mouth 3 (three) times daily.  Indication: Generalized Anxiety Disorder   hydrOXYzine 50 MG tablet Commonly known as: ATARAX Take 1 tablet (50 mg total) by mouth 3 (three) times daily as needed for anxiety. What  changed:  when to take this reasons to take this  Indication: Feeling Anxious, Feeling Tense   multivitamin with minerals Tabs tablet Take 1 tablet by mouth daily.  Indication: supplement   nicotine 21 mg/24hr patch Commonly known as: NICODERM CQ - dosed in mg/24 hours Place 1 patch (21 mg total) onto the skin daily for 28 days. Replaces: nicotine 14 mg/24hr patch  Indication: Nicotine Addiction   nicotine polacrilex 2 MG gum Commonly known as: NICORETTE Take 1 each (2 mg total) by mouth as needed for smoking cessation. What changed:  medication strength how much to take how to take this  Indication: Nicotine Addiction   OLANZapine 5 MG tablet Commonly known as: ZYPREXA Take 1.5 tablets (7.5 mg total) by mouth at bedtime.  Indication: Major Depressive Disorder   propranolol 10 MG tablet Commonly known as: INDERAL Take 1 tablet (10 mg total) by mouth daily.  Indication: Feeling Anxious, Migraine Headache   QUEtiapine 200 MG tablet Commonly known as: SEROQUEL Take 1 tablet (200 mg total) by mouth at bedtime.  Indication: Trouble Sleeping, Major Depressive Disorder   thiamine 100 MG tablet Commonly known as: VITAMIN B1 Take 1 tablet (100 mg total) by mouth daily.  Indication: Deficiency of Vitamin B1   traZODone 50 MG tablet Commonly known as: DESYREL Take 1 tablet (50 mg total) by mouth at bedtime as needed for sleep.  Indication: Trouble Sleeping, Major Depressive Disorder        Follow-up Information     [facility], [facility]. Go to.   Why: Appointment scedualed for 01/08/33 at 9AM. Please bring discharge paperwork with you to appointment. Contact information: [address] [place] [address] [phone]                 Follow-up recommendations:  Activity:  as toelrated Diet:  heart healthy  Comments:   Gabapentin 300 mg TID: For anxiety and withdrawal management. Quetiapine 100 mg nightly: To stabilize mood and improve  sleep. Olanzapine (Zyprexa) 7.5 mg nightly: To address intrusive thoughts and stabilize mood. Propranolol 10 mg BID PRN: For tremors and irritability associated with withdrawal. National Suicide Prevention Lifeline: 1-800-273-TALK 321-826-9788) Crisis Text Line: Text HOME to 5123031232 Scheduled follow-up for continued mental health care and medication management. January 08, 2033, at 9:00 AM [address] [place], [place] [address] Phone: [phone]  Signed: Myriam Forehand, NP 01/07/2023, 11:13 AM

## 2023-01-07 NOTE — Plan of Care (Signed)

## 2023-01-07 NOTE — Progress Notes (Signed)
   01/06/23 2000  Psych Admission Type (Psych Patients Only)  Admission Status Voluntary  Psychosocial Assessment  Patient Complaints Restlessness;Sadness  Eye Contact Fair  Facial Expression Anxious  Affect Apprehensive;Preoccupied;Anxious  Speech Logical/coherent  Interaction Assertive;Needy  Motor Activity Slow  Appearance/Hygiene Unremarkable  Behavior Characteristics Cooperative;Appropriate to situation  Mood Preoccupied  Thought Process  Coherency WDL  Content WDL  Delusions None reported or observed  Perception WDL  Hallucination None reported or observed  Judgment WDL  Confusion WDL  Danger to Self  Current suicidal ideation? Denies  Agreement Not to Harm Self Yes  Description of Agreement verbal  Danger to Others  Danger to Others None reported or observed   Patient appears less anxious no distress noted interacting appropriately with peers and staff. Patient denies SI/HI/AVH. 15 minutes safety checks maintained will continue to monitor.

## 2023-01-07 NOTE — Progress Notes (Signed)
  [facility] Adult Case Management Discharge Plan :  Will you be returning to the same living situation after discharge:  Yes,  pt reports that he is returning home At discharge, do you have transportation home?: Yes,  pt reports that mother will provide transportation Do you have the ability to pay for your medications: No.  Release of information consent forms completed and in the chart;  Patient's signature needed at discharge.  Patient to Follow up at:  Follow-up Information     [facility], [facility]. Go to.   Why: Appointment scedualed for 01/08/33 at 9AM. Please bring discharge paperwork with you to appointment. Contact information: [address] [place] [address] [phone]                 Next level of care provider has access to [facility] Link:no  Safety Planning and Suicide Prevention discussed: Yes,  SPE completed with the patient and patient's collateral     Has patient been referred to the Quitline?: Patient refused referral for treatment  Patient has been referred for addiction treatment: Patient refused referral for treatment; referral information given to patient at discharge.  Harden Mo, LCSW 01/07/2023, 10:22 AM

## 2023-01-08 ENCOUNTER — Emergency Department
Admission: EM | Admit: 2023-01-08 | Discharge: 2023-01-09 | Disposition: A | Discharge status: Other Acute Inpatient Hospital | Payer: 59 | Attending: Emergency Medicine | Admitting: Emergency Medicine

## 2023-01-08 ENCOUNTER — Other Ambulatory Visit: Payer: Self-pay

## 2023-01-08 DIAGNOSIS — F13939 Sedative, hypnotic or anxiolytic use, unspecified with withdrawal, unspecified: Secondary | ICD-10-CM | POA: Diagnosis present

## 2023-01-08 DIAGNOSIS — F13931 Sedative, hypnotic or anxiolytic use, unspecified with withdrawal delirium: Secondary | ICD-10-CM | POA: Diagnosis not present

## 2023-01-08 DIAGNOSIS — F13239 Sedative, hypnotic or anxiolytic dependence with withdrawal, unspecified: Secondary | ICD-10-CM | POA: Diagnosis not present

## 2023-01-08 DIAGNOSIS — F411 Generalized anxiety disorder: Secondary | ICD-10-CM

## 2023-01-08 DIAGNOSIS — F332 Major depressive disorder, recurrent severe without psychotic features: Secondary | ICD-10-CM | POA: Diagnosis present

## 2023-01-08 DIAGNOSIS — F419 Anxiety disorder, unspecified: Secondary | ICD-10-CM

## 2023-01-08 LAB — URINE DRUG SCREEN, QUALITATIVE (ARMC ONLY)
Amphetamines, Ur Screen: NOT DETECTED
Barbiturates, Ur Screen: NOT DETECTED
Benzodiazepine, Ur Scrn: POSITIVE — AB
Cannabinoid 50 Ng, Ur ~~LOC~~: NOT DETECTED
Cocaine Metabolite,Ur ~~LOC~~: NOT DETECTED
MDMA (Ecstasy)Ur Screen: NOT DETECTED
Methadone Scn, Ur: NOT DETECTED
Opiate, Ur Screen: NOT DETECTED
Phencyclidine (PCP) Ur S: NOT DETECTED
Tricyclic, Ur Screen: POSITIVE — AB

## 2023-01-08 LAB — COMPREHENSIVE METABOLIC PANEL
ALT: 64 U/L — ABNORMAL HIGH (ref 0–44)
AST: 39 U/L (ref 15–41)
Albumin: 5 g/dL (ref 3.5–5.0)
Alkaline Phosphatase: 89 U/L (ref 38–126)
Anion gap: 10 (ref 5–15)
BUN: 14 mg/dL (ref 6–20)
CO2: 24 mmol/L (ref 22–32)
Calcium: 8.9 mg/dL (ref 8.9–10.3)
Chloride: 105 mmol/L (ref 98–111)
Creatinine, Ser: 0.95 mg/dL (ref 0.61–1.24)
GFR, Estimated: >60 mL/min (ref >60)
Glucose, Bld: 114 mg/dL — ABNORMAL HIGH (ref 70–99)
Potassium: 3.8 mmol/L (ref 3.5–5.1)
Sodium: 139 mmol/L (ref 135–145)
Total Bilirubin: 0.7 mg/dL (ref <1.2)
Total Protein: 8.3 g/dL — ABNORMAL HIGH (ref 6.5–8.1)

## 2023-01-08 LAB — CBC
HCT: 48.8 % (ref 39.0–52.0)
Hemoglobin: 16.5 g/dL (ref 13.0–17.0)
MCH: 30.1 pg (ref 26.0–34.0)
MCHC: 33.8 g/dL (ref 30.0–36.0)
MCV: 89.1 fL (ref 80.0–100.0)
Platelets: 421 10*3/uL — ABNORMAL HIGH (ref 150–400)
RBC: 5.48 MIL/uL (ref 4.22–5.81)
RDW: 12.3 % (ref 11.5–15.5)
WBC: 17.1 10*3/uL — ABNORMAL HIGH (ref 4.0–10.5)
nRBC: 0 % (ref 0.0–0.2)

## 2023-01-08 LAB — ACETAMINOPHEN LEVEL: Acetaminophen (Tylenol), Serum: <10 ug/mL — ABNORMAL LOW (ref 10–30)

## 2023-01-08 LAB — SALICYLATE LEVEL: Salicylate Lvl: <7 mg/dL — ABNORMAL LOW (ref 7.0–30.0)

## 2023-01-08 LAB — ETHANOL: Alcohol, Ethyl (B): <10 mg/dL (ref <10)

## 2023-01-08 NOTE — ED Provider Notes (Signed)
[facility] Provider Note    Event Date/Time   First MD Initiated Contact with Patient 01/08/23 1956     (approximate)   History   Anxiety   HPI  Ronald [name]. is a 47 y.o. male who presents to the emergency department today with concerns for continued anxiety and thoughts about wanting to harm himself and others.  The patient was discharged from behavioral health unit yesterday.  States since being home he is continued to have his symptoms.  The patient says he feels like he might need different medications or a different stay.     Physical Exam   Triage Vital Signs: ED Triage Vitals  Encounter Vitals Group     BP 01/08/23 1930 (!) 176/123     Systolic BP Percentile --      Diastolic BP Percentile --      Pulse Rate 01/08/23 1930 97     Resp 01/08/23 1930 20     Temp 01/08/23 1930 98.2 F (36.8 C)     Temp Source 01/08/23 1930 Oral     SpO2 01/08/23 1930 98 %     Weight 01/08/23 1931 200 lb (90.7 kg)     Height 01/08/23 1931 5\' 7"  (1.702 m)     Head Circumference --      Peak Flow --      Pain Score 01/08/23 1931 0     Pain Loc --      Pain Education --      Exclude from Growth Chart --     Most recent vital signs: Vitals:   01/08/23 1930  BP: (!) 176/123  Pulse: 97  Resp: 20  Temp: 98.2 F (36.8 C)  SpO2: 98%   General: Awake, alert, anxious. CV:  Good peripheral perfusion. Regular rate and rhythm. Resp:  Normal effort. Lungs clear. Abd:  No distention.    ED Results / Procedures / Treatments   Labs (all labs ordered are listed, but only abnormal results are displayed) Labs Reviewed  COMPREHENSIVE METABOLIC PANEL - Abnormal; Notable for the following components:      Result Value   Glucose, Bld 114 (*)    Total Protein 8.3 (*)    ALT 64 (*)    All other components within normal limits  CBC - Abnormal; Notable for the following components:   WBC 17.1 (*)    Platelets 421 (*)    All other components within normal  limits  ETHANOL  SALICYLATE LEVEL  ACETAMINOPHEN LEVEL  URINE DRUG SCREEN, QUALITATIVE ([facility] ONLY)     EKG  None   RADIOLOGY None   PROCEDURES:  Critical Care performed: No    MEDICATIONS ORDERED IN ED: Medications - No data to display   IMPRESSION / MDM / ASSESSMENT AND PLAN / ED COURSE  I reviewed the triage vital signs and the nursing notes.                              Differential diagnosis includes, but is not limited to, psychiatric illness, drug induced mood disorder  Patient's presentation is most consistent with acute presentation with potential threat to life or bodily function.   Patient presented to the emergency department today because of concerns for continued psychiatric illness.  Will have psychiatry evaluate.  The patient has been placed in psychiatric observation due to the need to provide a safe environment for the patient while obtaining  psychiatric consultation and evaluation, as well as ongoing medical and medication management to treat the patient's condition.  The patient has not been placed under full IVC at this time.      FINAL CLINICAL IMPRESSION(S) / ED DIAGNOSES   Anxiety    Note:  This document was prepared using Dragon voice recognition software and may include unintentional dictation errors.    Ronald Semen, MD 01/08/23 2026

## 2023-01-08 NOTE — ED Notes (Addendum)
Patient changed out into hospital safe scrubs at this time. This RN and EDT Shanda Bumps as witness. Patient belongings placed in belongings bag with pt label and then into hospital secure location.  Patient belongings as follows: 1 pair black socks 1 pair tennis shoes 1 pair blue pants  1 pair shorts 1 pair underwear 1 red sweatshirt 1 whit undershirt 1 vape  Pt does not have cellphone or keys with him  Pt glasses remain with pt

## 2023-01-08 NOTE — BH Assessment (Signed)
Comprehensive Clinical Assessment (CCA) Screening, Triage and Referral Note  01/08/2023 Ronald [name] [id number] Recommendations for Services/Supports/Treatments: Consulted with Ronald D., NP, who recommended pt. for continued observation and reassessment in the AM.   Ronald [name]. Ronald [name]. is a 47 year old, English speaking, Caucasian male with an hx of GAD and MDD, recurrent, severe without psychotic features. Pt is voluntary. Per triage note: Pt reports HI and SI. Reports recent discharge from psychiatric ward for the same yesterday. Pt notably anxious and tearful in triage. Pt states "I feel like I'm a threat to everybody. Something has snapped in my brain, and I need real help. My brain just floods with thoughts of hurting other people and then it turns to suicide because I love everybody and I don't want to hurt anybody." Pt reports that these thoughts come out of nowhere and he can't shake them. States "I feel like I'm playing tug of war in my head." Pt is requesting in patient treatment and therapy. Pt states "I can hear my voice in my head. I'm not sure if that counts as hallucinations." Pt is overall calm and cooperative in triage, just emotional. Pt is ambulatory. Breathing unlabored and speaking in full sentences.  Upon assessment, pt is visibly anxious evidenced by shaking and becoming tearful. When asked why he'd presented to the ED the pt. acknowledged that he is experiencing worsening intrusive thoughts about harming his infant granddaughter. Pt explained that he has thoughts that are becoming harder and harder not to act on. Pt identified his main stressors as unmanageability around his thought life. Pt reported that he'd asked his daughter and her family to leave to avoid acting out his thoughts. Pt reported that he does have good relationships with his family members, explaining that they are supportive of him and his challenges. Pt expressed that he is afraid of any and everything now. Pt  presented with a anxious mood and a congruent affect. Pt was alert and oriented x4. Pt's speech was clear and coherent. Pt reported that he has a follow up appointment with [facility] 01/09/23. Pt presented with relevant thought processes and restless psychomotor activity. Pt admitted to recently withdrawing from benzos and feels uncertain about whether this is a side effect of the detox.     Chief Complaint: No chief complaint on file.  Visit Diagnosis: Major depressive disorder, recurrent severe without psychotic features (HCC) Active Problems: Generalized anxiety disorder  Patient Reported Information How did you hear about [place]? Self  What Is the Reason for Your Visit/Call Today? Severe anxiety and depression  How Long Has This Been Causing You Problems? 1-6 months  What Do You Feel Would Help You the Most Today? Treatment for Depression or other mood problem; Medication(s)   Have You Recently Had Any Thoughts About Hurting Yourself? Yes  Are You Planning to Commit Suicide/Harm Yourself At This time? No   Have you Recently Had Thoughts About Hurting Someone Ronald [name]? No  Are You Planning to Harm Someone at This Time? No data recorded Explanation: No data recorded  Have You Used Any Alcohol or Drugs in the Past 24 Hours? No  How Long Ago Did You Use Drugs or Alcohol? No data recorded What Did You Use and How Much? No data recorded  Do You Currently Have a Therapist/Psychiatrist? Yes  Name of Therapist/Psychiatrist: [facility]   Have You Been Recently Discharged From Any Office Practice or Programs? Yes  Explanation of Discharge From Practice/Program: [facility]    CCA Screening Triage  Referral Assessment Type of Contact: Face-to-Face  Telemedicine Service Delivery:   Is this Initial or Reassessment?   Date Telepsych consult ordered in [facility]:    Time Telepsych consult ordered in [facility]:    Location of Assessment: [facility] ED  Provider Location: [facility] ED    Collateral Involvement: Mom-  Ronald [name]   Does Patient Have a Automotive engineer Guardian? No data recorded Name and Contact of Legal Guardian: No data recorded If Minor and Not Living with Parent(s), Who has Custody? No data recorded Is CPS involved or ever been involved? Never  Is APS involved or ever been involved? Never   Patient Determined To Be At Risk for Harm To Self or Others Based on Review of Patient Reported Information or Presenting Complaint? No  Method: No Plan  Availability of Means: No access or NA  Intent: Vague intent or NA  Notification Required: No need or identified person  Additional Information for Danger to Others Potential: No data recorded Additional Comments for Danger to Others Potential: No data recorded Are There Guns or Other Weapons in Your Home? Yes  Types of Guns/Weapons: No data recorded Are These Weapons Safely Secured?                            Yes  Who Could Verify You Are Able To Have These Secured: No data recorded Do You Have any Outstanding Charges, Pending Court Dates, Parole/Probation? No data recorded Contacted To Inform of Risk of Harm To Self or Others: No data recorded  Does Patient Present under Involuntary Commitment? No    [place] of Residence: [place]   Patient Currently Receiving the Following Services: Individual Therapy; Medication Management   Determination of Need: Emergent (2 hours)   Options For Referral: ED Visit; Inpatient Hospitalization; Medication Management; Outpatient Therapy   Discharge Disposition:     Mariabelen Pressly R Tyhir Schwan, LCAS

## 2023-01-08 NOTE — ED Triage Notes (Signed)
Pt reports HI and SI. Reports recent discharge from psychiatric ward for the same yesterday. Pt notably anxious and tearful in triage. Pt states "I feel like I'm a threat to everybody. Something has snapped in my brain, and I need real help. My brain just floods with thoughts of hurting other people and then it turns to suicide because I love everybody and I don't want to hurt anybody." Pt reports that these thoughts come out of no where and he can't shake them. States "I feel like I'm playing tug of war in my head." Pt is requesting in patient treatment and therapy. Pt states "I can hear my voice in my head. I'm not sure if that counts as hallucinations." Pt is overall calm and cooperative in triage, just emotional. Pt is ambulatory. Breathing unlabored and speaking in full sentences. Pt denies physical pain.

## 2023-01-08 NOTE — ED Notes (Signed)
Pt. To BHU from ED ambulatory without difficulty, to room  BHU 4. Report from [facility]. Pt. Is alert and oriented, warm and dry in no distress. Pt. Denies SI, HI, and AVH. Pt. Calm and cooperative. Pt. Made aware of security cameras and Q15 minute rounds. Pt. Encouraged to let Nursing staff know of any concerns or needs.    ENVIRONMENTAL ASSESSMENT Potentially harmful objects out of patient reach: Yes.   Personal belongings secured: Yes.   Patient dressed in hospital provided attire only: Yes.   Plastic bags out of patient reach: Yes.   Patient care equipment (cords, cables, call bells, lines, and drains) shortened, removed, or accounted for: Yes.   Equipment and supplies removed from bottom of stretcher: Yes.   Potentially toxic materials out of patient reach: Yes.   Sharps container removed or out of patient reach: Yes.

## 2023-01-08 NOTE — ED Notes (Signed)
vol/psych consult ordered/pending.. 

## 2023-01-08 NOTE — Consult Note (Cosign Needed Addendum)
Telepsych Consultation   Reason for Consult:  Psych Evaluation Referring Physician:  Dr. Derrill Kay Location of Patient: [facility] ER Location of Provider: Behavioral Health TTS Department  Patient Identification: Ronald [name]. MRN:  [id number] Principal Diagnosis: Generalized anxiety disorder Diagnosis:  Principal Problem:   Generalized anxiety disorder Active Problems:   Benzodiazepine withdrawal (HCC)   Major depressive disorder, recurrent severe without psychotic features (HCC)   Total Time spent with patient: 45 minutes  Subjective:     HPI:  Tele psych Assessment   Ronald [name]., 47 y.o., male patient seen via tele health by TTS and this provider; chart reviewed and consulted with Dr. Derrill Kay on 01/08/23.  On evaluation Ronald [name]. reports that he would be perfectly fine at time then he would have moments of violent intrusive thoughts.  For, example, today when he got home and played with his granddaughter, he says he went outside to play with her then he started having thoughts of hurting his granddaughter.  He says he doesn't feel like any medicines are working.  He reports that while hospitalized he has had thoughts of hurting other people and had to isolate himself from acting on them. He denies ever hurting anyone else because of his thoughts.  He says he felt that he would come real close to hurting someone and especially his granddaughter. Per chart review, he was discharged from [facility] yesterday after a 16 day stay.  He says he told his daughter that he would always tell someone if he was having these thoughts. So he told his daughter to take the baby and leave.He says these thoughts come and go and feels as though he has no self control over his thoughts. He says these thoughts started to be overwhelming in the last week. Of note, he is also recently "detoxed from benzos."  During evaluation Ronald [name]. Is sitting up on the bed; he is alert/oriented x 4;  anxious/nervous/cooperative; and mood congruent with affect.  Patient is talkative and speaking in a clear tone at moderate volume, and normal pace; with good eye contact.  His thought process is coherent and relevant; he admits to intrusive thoughts that comes and goes and states that he feels lately they have gotten worst. There is no indication that he is currently responding to internal/external stimuli or experiencing delusional thought content.  Patient denies suicidal/self-harm/homicidal ideation at this time.  There is no evidence of  psychosis, and paranoia.    Recommendations: inpatient psychiatric hospiatlization  Dr. Derrill Kay informed of above recommendation and disposition  Past Psychiatric History: Panic disorder  Risk to Self:   Risk to Others:   Prior Inpatient Therapy:   Prior Outpatient Therapy:    Past Medical History:  Past Medical History:  Diagnosis Date   Anxiety    Depression    Family history of adverse reaction to anesthesia    difficult to wake up - Mother   GERD (gastroesophageal reflux disease)    PTSD (post-traumatic stress disorder)     Past Surgical History:  Procedure Laterality Date   ELECTROCONVULSIVE THERAPY     FRACTURE SURGERY     wrist left   INSERTION OF MESH  08/25/2021   Procedure: INSERTION OF MESH;  Surgeon: Sung Amabile, DO;  Location: [facility] ORS;  Service: General;;   UMBILICAL HERNIA REPAIR  08/25/2021   Procedure: HERNIA REPAIR UMBILICAL ADULT;  Surgeon: Sung Amabile, DO;  Location: [facility] ORS;  Service: General;;   Family History:  History reviewed. No pertinent family history. Family Psychiatric  History: unknown Social History:  Social History   Substance and Sexual Activity  Alcohol Use Yes   Comment: occasional     Social History   Substance and Sexual Activity  Drug Use No    Social History   Socioeconomic History   Marital status: Single    Spouse name: Not on file   Number of children: Not on file   Years of  education: Not on file   Highest education level: Not on file  Occupational History   Not on file  Tobacco Use   Smoking status: Every Day    Current packs/day: 1.00    Types: Cigarettes   Smokeless tobacco: Never  Vaping Use   Vaping status: Every Day  Substance and Sexual Activity   Alcohol use: Yes    Comment: occasional   Drug use: No   Sexual activity: Not on file  Other Topics Concern   Not on file  Social History Narrative   Not on file   Social Determinants of Health   Financial Resource Strain: Medium Risk (12/10/2022)   Received from [facility]   Overall Financial Resource Strain (CARDIA)    Difficulty of Paying Living Expenses: Somewhat hard  Food Insecurity: No Food Insecurity (12/26/2022)   Hunger Vital Sign    Worried About Running Out of Food in the Last Year: Never true    Ran Out of Food in the Last Year: Never true  Transportation Needs: No Transportation Needs (12/26/2022)   PRAPARE - Administrator, Civil Service (Medical): No    Lack of Transportation (Non-Medical): No  Physical Activity: Not on file  Stress: Not on file  Social Connections: Not on file   Additional Social History:    Allergies:   Allergies  Allergen Reactions   Buspar [Buspirone]     Labs:  Results for orders placed or performed during the hospital encounter of 01/08/23 (from the past 48 hour(s))  Comprehensive metabolic panel     Status: Abnormal   Collection Time: 01/08/23  7:42 PM  Result Value Ref Range   Sodium 139 135 - 145 mmol/L   Potassium 3.8 3.5 - 5.1 mmol/L   Chloride 105 98 - 111 mmol/L   CO2 24 22 - 32 mmol/L   Glucose, Bld 114 (H) 70 - 99 mg/dL    Comment: Glucose reference range applies only to samples taken after fasting for at least 8 hours.   BUN 14 6 - 20 mg/dL   Creatinine, Ser 2.54 0.61 - 1.24 mg/dL   Calcium 8.9 8.9 - 27.0 mg/dL   Total Protein 8.3 (H) 6.5 - 8.1 g/dL   Albumin 5.0 3.5 - 5.0 g/dL   AST 39 15 - 41 U/L   ALT 64  (H) 0 - 44 U/L   Alkaline Phosphatase 89 38 - 126 U/L   Total Bilirubin 0.7 <1.2 mg/dL   GFR, Estimated >62 >37 mL/min    Comment: (NOTE) Calculated using the CKD-EPI Creatinine Equation (2021)    Anion gap 10 5 - 15    Comment: Performed at [facility], [address] [place], [place] [address]  cbc     Status: Abnormal   Collection Time: 01/08/23  7:42 PM  Result Value Ref Range   WBC 17.1 (H) 4.0 - 10.5 K/uL   RBC 5.48 4.22 - 5.81 MIL/uL   Hemoglobin 16.5 13.0 - 17.0 g/dL   HCT 48.8  39.0 - 52.0 %   MCV 89.1 80.0 - 100.0 fL   MCH 30.1 26.0 - 34.0 pg   MCHC 33.8 30.0 - 36.0 g/dL   RDW 16.1 09.6 - 04.5 %   Platelets 421 (H) 150 - 400 K/uL   nRBC 0.0 0.0 - 0.2 %    Comment: Performed at [facility], [address] [place], [place] [address]    Medications:  No current facility-administered medications for this encounter.   Current Outpatient Medications  Medication Sig Dispense Refill   escitalopram (LEXAPRO) 10 MG tablet Take 1 tablet (10 mg total) by mouth daily. 30 tablet 0   gabapentin (NEURONTIN) 300 MG capsule Take 1 capsule (300 mg total) by mouth 3 (three) times daily. 90 capsule 0   hydrOXYzine (ATARAX) 50 MG tablet Take 1 tablet (50 mg total) by mouth 3 (three) times daily as needed for anxiety. 30 tablet 0   Multiple Vitamin (MULTIVITAMIN WITH MINERALS) TABS tablet Take 1 tablet by mouth daily. 30 tablet 0   nicotine (NICODERM CQ - DOSED IN MG/24 HOURS) 21 mg/24hr patch Place 1 patch (21 mg total) onto the skin daily for 28 days. 28 patch 0   nicotine polacrilex (NICORETTE) 2 MG gum Take 1 each (2 mg total) by mouth as needed for smoking cessation. 100 tablet 0   OLANZapine (ZYPREXA) 5 MG tablet Take 1.5 tablets (7.5 mg total) by mouth at bedtime. 45 tablet 0   propranolol (INDERAL) 10 MG tablet Take 1 tablet (10 mg total) by mouth daily. 30 tablet 0   QUEtiapine (SEROQUEL) 200 MG tablet Take 1 tablet (200 mg total) by mouth at bedtime. 30  tablet 0   thiamine (VITAMIN B-1) 100 MG tablet Take 1 tablet (100 mg total) by mouth daily. 30 tablet 0   traZODone (DESYREL) 50 MG tablet Take 1 tablet (50 mg total) by mouth at bedtime as needed for sleep. 30 tablet 0    Musculoskeletal: Strength & Muscle Tone: within normal limits Gait & Station: normal Patient leans: N/A  Psychiatric Specialty Exam:  Presentation  General Appearance:  Appropriate for Environment; Neat  Eye Contact: Good  Speech: Clear and Coherent  Speech Volume: Normal  Handedness: Right   Mood and Affect  Mood: Irritable; Anxious; Labile  Affect: Labile   Thought Process  Thought Processes: Coherent  Descriptions of Associations:Intact  Orientation:Full (Time, Place and Person) (and situation)  Thought Content:Obsessions; Scattered  History of Schizophrenia/Schizoaffective disorder:No  Duration of Psychotic Symptoms:No data recorded Hallucinations:No data recorded Ideas of Reference:None  Suicidal Thoughts:No data recorded Homicidal Thoughts:No data recorded  Sensorium  Memory: Immediate Good; Remote Good  Judgment: Poor  Insight: Poor   Executive Functions  Concentration: Poor  Attention Span: Fair  Recall: Good  Fund of Knowledge: Good  Language: Good   Psychomotor Activity  Psychomotor Activity:No data recorded  Assets  Assets: Communication Skills; Housing; Financial Resources/Insurance   Sleep  Sleep:No data recorded   Physical Exam: Physical Exam Vitals and nursing note reviewed.  HENT:     Head: Normocephalic and atraumatic.     Nose: Nose normal.     Mouth/Throat:     Mouth: Mucous membranes are dry.  Eyes:     Pupils: Pupils are equal, round, and reactive to light.  Pulmonary:     Effort: Pulmonary effort is normal.  Musculoskeletal:        General: Normal range of motion.  Skin:    General: Skin is dry.  Neurological:  Mental Status: He is alert and oriented to person,  place, and time.  Psychiatric:        Attention and Perception: Attention normal.        Mood and Affect: Mood is anxious and depressed. Affect is tearful.        Speech: Speech normal.        Behavior: Behavior is cooperative.        Thought Content: Thought content is paranoid. Thought content includes homicidal and suicidal ideation.        Cognition and Memory: Cognition and memory normal.    Review of Systems  Psychiatric/Behavioral:  Positive for depression, hallucinations and suicidal ideas. Negative for memory loss. The patient is nervous/anxious.   All other systems reviewed and are negative.  Blood pressure (!) 176/123, pulse 97, temperature 98.2 F (36.8 C), temperature source Oral, resp. rate 20, height 5\' 7"  (1.702 m), weight 90.7 kg, SpO2 98%. Body mass index is 31.32 kg/m.  Treatment Plan Summary: Daily contact with patient to assess and evaluate symptoms and progress in treatment, Medication management, and Plan    Disposition: Recommend psychiatric Inpatient admission when medically cleared. Supportive therapy provided about ongoing stressors. Discussed crisis plan, support from social network, calling 911, coming to the Emergency Department, and calling Suicide Hotline.  This service was provided via telemedicine using a 2-way, interactive audio and video technology.     Jearld Lesch, NP 01/08/2023 8:37 PM

## 2023-01-09 ENCOUNTER — Inpatient Hospital Stay
Admission: AD | Admit: 2023-01-09 | Discharge: 2023-01-22 | DRG: 885 | Disposition: A | Discharge status: Home / Self Care | Payer: 59 | Source: Intra-hospital | Attending: Psychiatry | Admitting: Psychiatry

## 2023-01-09 ENCOUNTER — Encounter: Payer: Self-pay | Admitting: Psychiatric/Mental Health

## 2023-01-09 DIAGNOSIS — I10 Essential (primary) hypertension: Secondary | ICD-10-CM | POA: Diagnosis present

## 2023-01-09 DIAGNOSIS — Z888 Allergy status to other drugs, medicaments and biological substances status: Secondary | ICD-10-CM | POA: Diagnosis not present

## 2023-01-09 DIAGNOSIS — R251 Tremor, unspecified: Secondary | ICD-10-CM | POA: Diagnosis present

## 2023-01-09 DIAGNOSIS — Z818 Family history of other mental and behavioral disorders: Secondary | ICD-10-CM

## 2023-01-09 DIAGNOSIS — F410 Panic disorder [episodic paroxysmal anxiety] without agoraphobia: Secondary | ICD-10-CM | POA: Diagnosis present

## 2023-01-09 DIAGNOSIS — Z5900 Homelessness unspecified: Secondary | ICD-10-CM

## 2023-01-09 DIAGNOSIS — G4700 Insomnia, unspecified: Secondary | ICD-10-CM | POA: Diagnosis present

## 2023-01-09 DIAGNOSIS — R4587 Impulsiveness: Secondary | ICD-10-CM | POA: Diagnosis present

## 2023-01-09 DIAGNOSIS — F411 Generalized anxiety disorder: Secondary | ICD-10-CM | POA: Diagnosis present

## 2023-01-09 DIAGNOSIS — F17290 Nicotine dependence, other tobacco product, uncomplicated: Secondary | ICD-10-CM | POA: Diagnosis present

## 2023-01-09 DIAGNOSIS — E519 Thiamine deficiency, unspecified: Secondary | ICD-10-CM | POA: Diagnosis present

## 2023-01-09 DIAGNOSIS — F17210 Nicotine dependence, cigarettes, uncomplicated: Secondary | ICD-10-CM | POA: Diagnosis present

## 2023-01-09 DIAGNOSIS — Z560 Unemployment, unspecified: Secondary | ICD-10-CM

## 2023-01-09 DIAGNOSIS — F332 Major depressive disorder, recurrent severe without psychotic features: Principal | ICD-10-CM | POA: Diagnosis present

## 2023-01-09 DIAGNOSIS — R45851 Suicidal ideations: Secondary | ICD-10-CM | POA: Diagnosis present

## 2023-01-09 DIAGNOSIS — G479 Sleep disorder, unspecified: Secondary | ICD-10-CM | POA: Diagnosis present

## 2023-01-09 DIAGNOSIS — F428 Other obsessive-compulsive disorder: Secondary | ICD-10-CM | POA: Diagnosis present

## 2023-01-09 DIAGNOSIS — Z79899 Other long term (current) drug therapy: Secondary | ICD-10-CM

## 2023-01-09 DIAGNOSIS — Z5986 Financial insecurity: Secondary | ICD-10-CM | POA: Diagnosis not present

## 2023-01-09 DIAGNOSIS — F333 Major depressive disorder, recurrent, severe with psychotic symptoms: Secondary | ICD-10-CM | POA: Diagnosis present

## 2023-01-09 DIAGNOSIS — R45850 Homicidal ideations: Secondary | ICD-10-CM | POA: Diagnosis present

## 2023-01-09 MED ORDER — TRAZODONE HCL 50 MG PO TABS
50.0000 mg | ORAL_TABLET | Freq: Every evening | ORAL | Status: DC | PRN
Start: 2023-01-09 — End: 2023-01-09

## 2023-01-09 MED ORDER — ESCITALOPRAM OXALATE 10 MG PO TABS
10.0000 mg | ORAL_TABLET | Freq: Every day | ORAL | Status: DC
  Administered 2023-01-09: 10 mg via ORAL
  Filled 2023-01-09: qty 1

## 2023-01-09 MED ORDER — HALOPERIDOL 5 MG PO TABS
5.0000 mg | ORAL_TABLET | Freq: Three times a day (TID) | ORAL | Status: DC | PRN
  Administered 2023-01-09 – 2023-01-21 (×4): 5 mg via ORAL
  Filled 2023-01-09 (×4): qty 1

## 2023-01-09 MED ORDER — DIPHENHYDRAMINE HCL 50 MG/ML IJ SOLN: 50.0000 mg | Freq: Three times a day (TID) | INTRAMUSCULAR | Status: DC | PRN

## 2023-01-09 MED ORDER — FLUVOXAMINE MALEATE 50 MG PO TABS
50.0000 mg | ORAL_TABLET | Freq: Two times a day (BID) | ORAL | Status: DC
  Administered 2023-01-09 – 2023-01-11 (×4): 50 mg via ORAL
  Filled 2023-01-09 (×5): qty 1

## 2023-01-09 MED ORDER — PROPRANOLOL HCL 20 MG PO TABS
10.0000 mg | ORAL_TABLET | Freq: Every day | ORAL | Status: DC
  Administered 2023-01-09: 10 mg via ORAL
  Filled 2023-01-09: qty 1

## 2023-01-09 MED ORDER — ALUM & MAG HYDROXIDE-SIMETH 200-200-20 MG/5ML PO SUSP: 30.0000 mL | ORAL | Status: DC | PRN

## 2023-01-09 MED ORDER — DIPHENHYDRAMINE HCL 25 MG PO CAPS
50.0000 mg | ORAL_CAPSULE | Freq: Three times a day (TID) | ORAL | Status: DC | PRN
  Administered 2023-01-09 – 2023-01-21 (×8): 50 mg via ORAL
  Filled 2023-01-09 (×9): qty 2

## 2023-01-09 MED ORDER — LORAZEPAM 2 MG/ML IJ SOLN: 2.0000 mg | Freq: Three times a day (TID) | INTRAMUSCULAR | Status: DC | PRN

## 2023-01-09 MED ORDER — TRAZODONE HCL 50 MG PO TABS
50.0000 mg | ORAL_TABLET | Freq: Every evening | ORAL | Status: DC | PRN
  Administered 2023-01-09: 50 mg via ORAL
  Filled 2023-01-09: qty 1

## 2023-01-09 MED ORDER — CLONIDINE HCL 0.1 MG PO TABS
0.1000 mg | ORAL_TABLET | ORAL | Status: AC
  Administered 2023-01-09: 0.1 mg via ORAL
  Filled 2023-01-09: qty 1

## 2023-01-09 MED ORDER — ESCITALOPRAM OXALATE 10 MG PO TABS: 10.0000 mg | ORAL_TABLET | Freq: Every day | ORAL | Status: DC

## 2023-01-09 MED ORDER — OLANZAPINE 5 MG PO TABS
7.5000 mg | ORAL_TABLET | Freq: Every day | ORAL | Status: DC
  Administered 2023-01-09 – 2023-01-16 (×8): 7.5 mg via ORAL
  Filled 2023-01-09 (×8): qty 2

## 2023-01-09 MED ORDER — HALOPERIDOL LACTATE 5 MG/ML IJ SOLN: 10.0000 mg | Freq: Three times a day (TID) | INTRAMUSCULAR | Status: DC | PRN

## 2023-01-09 MED ORDER — GABAPENTIN 300 MG PO CAPS
300.0000 mg | ORAL_CAPSULE | Freq: Three times a day (TID) | ORAL | Status: DC
  Administered 2023-01-09 – 2023-01-22 (×41): 300 mg via ORAL
  Filled 2023-01-09 (×41): qty 1

## 2023-01-09 MED ORDER — ACETAMINOPHEN 325 MG PO TABS
650.0000 mg | ORAL_TABLET | Freq: Four times a day (QID) | ORAL | Status: DC | PRN
  Administered 2023-01-16: 650 mg via ORAL
  Filled 2023-01-09: qty 2

## 2023-01-09 MED ORDER — MAGNESIUM HYDROXIDE 400 MG/5ML PO SUSP: 30.0000 mL | Freq: Every day | ORAL | Status: DC | PRN

## 2023-01-09 MED ORDER — QUETIAPINE FUMARATE 200 MG PO TABS: 200.0000 mg | ORAL_TABLET | Freq: Every day | ORAL | Status: DC

## 2023-01-09 MED ORDER — PROPRANOLOL HCL 20 MG PO TABS
10.0000 mg | ORAL_TABLET | Freq: Two times a day (BID) | ORAL | Status: DC
  Administered 2023-01-09 – 2023-01-16 (×15): 10 mg via ORAL
  Filled 2023-01-09 (×15): qty 1

## 2023-01-09 MED ORDER — GABAPENTIN 300 MG PO CAPS
300.0000 mg | ORAL_CAPSULE | Freq: Three times a day (TID) | ORAL | Status: DC
Start: 2023-01-09 — End: 2023-01-09

## 2023-01-09 MED ORDER — PROPRANOLOL HCL 10 MG PO TABS: 10.0000 mg | ORAL_TABLET | Freq: Every day | ORAL | Status: DC

## 2023-01-09 MED ORDER — HYDROXYZINE HCL 25 MG PO TABS
50.0000 mg | ORAL_TABLET | Freq: Three times a day (TID) | ORAL | Status: DC | PRN
Start: 2023-01-09 — End: 2023-01-09
  Administered 2023-01-09: 50 mg via ORAL
  Filled 2023-01-09: qty 2

## 2023-01-09 MED ORDER — HYDROXYZINE HCL 50 MG PO TABS
50.0000 mg | ORAL_TABLET | Freq: Three times a day (TID) | ORAL | Status: DC | PRN
  Administered 2023-01-10 – 2023-01-22 (×8): 50 mg via ORAL
  Filled 2023-01-09 (×8): qty 1

## 2023-01-09 MED ORDER — OLANZAPINE 5 MG PO TABS
7.5000 mg | ORAL_TABLET | Freq: Every day | ORAL | Status: DC
Start: 2023-01-09 — End: 2023-01-09

## 2023-01-09 MED ORDER — HALOPERIDOL LACTATE 5 MG/ML IJ SOLN: 5.0000 mg | Freq: Three times a day (TID) | INTRAMUSCULAR | Status: DC | PRN

## 2023-01-09 NOTE — H&P (Signed)
Psychiatric Admission Assessment Adult  Patient Identification: Ronald [name]. MRN:  [id number] Date of Evaluation:  01/09/2023 Chief Complaint:  MDD (major depressive disorder), recurrent, severe, with psychosis (HCC) [F33.3] Principal Diagnosis: Major depressive disorder, recurrent severe without psychotic features (HCC) Diagnosis:  Principal Problem:   Major depressive disorder, recurrent severe without psychotic features (HCC) Active Problems:   Obsessive thinking  History of Present Illness:  47 yo male presented with depression and anxiety, history of OCD thinking.  He was discharged on Monday and met with the [facility] representative and was doing better.  Then, he stated he had intrusive thoughts of hurting his grand baby and told his daughter to take the child and leave.  This upset him and he started having passive suicidal ideations with return to the hospital.  "Words keep turning through my mind."  In treatment team, the team discussed therapy to assist with diverting his thoughts and encouraged to come to groups to learn new skills.  He, however, stayed in his room except for meals and did not attend groups.  This provider let him know this is an important part of his therapy and he would need to come to group tomorrow.  Anxiety continues to be high with awakening to a panic attack last night.  Sleep was "not great".  Appetite is "ok, no side effects from his medications.  No hallucinations or paranoia.  Associated Signs/Symptoms: Depression Symptoms:  depressed mood, anxiety, (Hypo) Manic Symptoms:   none Anxiety Symptoms: Excessive worry Psychotic Symptoms:   none PTSD Symptoms: NA Total Time spent with patient: 1 hour  Past Psychiatric History: depression, anxiety  Is the patient at risk to self? Yes.    Has the patient been a risk to self in the past 6 months? Yes.    Has the patient been a risk to self within the distant past? Yes.    Is the patient a risk to others? No.   Has the patient been a risk to others in the past 6 months? No.  Has the patient been a risk to others within the distant past? No.   [place] Scale:  Flowsheet Row Admission (Current) from 01/09/2023 in [facility] INPATIENT BEHAVIORAL MEDICINE ED from 01/08/2023 in [facility] Emergency Department at [facility] Admission (Discharged) from 12/26/2022 in [facility] INPATIENT BEHAVIORAL MEDICINE  C-SSRS RISK CATEGORY Low Risk Moderate Risk No Risk        Prior Inpatient Therapy: Yes.   If yes, describe:  [facility]  Prior Outpatient Therapy: Yes.   If yes, describe: [facility]   Alcohol Screening: 1. How often do you have a drink containing alcohol?: Never 2. How many drinks containing alcohol do you have on a typical day when you are drinking?: 1 or 2 3. How often do you have six or more drinks on one occasion?: Never AUDIT-C Score: 0 4. How often during the last year have you found that you were not able to stop drinking once you had started?: Never 5. How often during the last year have you failed to do what was normally expected from you because of drinking?: Never 6. How often during the last year have you needed a first drink in the morning to get yourself going after a heavy drinking session?: Never 7. How often during the last year have you had a feeling of guilt of remorse after drinking?: Never 8. How often during the last year have you been unable to remember what happened the night before because you  had been drinking?: Never 9. Have you or someone else been injured as a result of your drinking?: No 10. Has a relative or friend or a doctor or another health worker been concerned about your drinking or suggested you cut down?: No Alcohol Use Disorder Identification Test Final Score (AUDIT): 0 Substance Abuse History in the last 12 months:  No. Consequences of Substance Abuse: NA Previous Psychotropic Medications: Yes  Psychological Evaluations: Yes  Past Medical History:  Past Medical History:   Diagnosis Date   Anxiety    Depression    Family history of adverse reaction to anesthesia    difficult to wake up - Mother   GERD (gastroesophageal reflux disease)    PTSD (post-traumatic stress disorder)     Past Surgical History:  Procedure Laterality Date   ELECTROCONVULSIVE THERAPY     FRACTURE SURGERY     wrist left   INSERTION OF MESH  08/25/2021   Procedure: INSERTION OF MESH;  Surgeon: Sung Amabile, DO;  Location: [facility] ORS;  Service: General;;   UMBILICAL HERNIA REPAIR  08/25/2021   Procedure: HERNIA REPAIR UMBILICAL ADULT;  Surgeon: Sung Amabile, DO;  Location: [facility] ORS;  Service: General;;   Family History: History reviewed. No pertinent family history. Family Psychiatric  History: family history of dementia Tobacco Screening:  Social History   Tobacco Use  Smoking Status Every Day   Current packs/day: 1.00   Average packs/day: 1 pack/day for 24.9 years (24.9 ttl pk-yrs)   Types: Cigarettes   Start date: 2000  Smokeless Tobacco Never    BH Tobacco Counseling     Are you interested in Tobacco Cessation Medications?  Yes, implement Nicotene Replacement Protocol Counseled patient on smoking cessation:  Refused/Declined practical counseling Reason Tobacco Screening Not Completed: No value filed.       Social History:  Social History   Substance and Sexual Activity  Alcohol Use Yes   Comment: occasional     Social History   Substance and Sexual Activity  Drug Use No    Additional Social History: recently fired from his job    Allergies:   Allergies  Allergen Reactions   Buspar [Buspirone]    Lab Results:  Results for orders placed or performed during the hospital encounter of 01/08/23 (from the past 48 hour(s))  Comprehensive metabolic panel     Status: Abnormal   Collection Time: 01/08/23  7:42 PM  Result Value Ref Range   Sodium 139 135 - 145 mmol/L   Potassium 3.8 3.5 - 5.1 mmol/L   Chloride 105 98 - 111 mmol/L   CO2 24 22 - 32 mmol/L    Glucose, Bld 114 (H) 70 - 99 mg/dL    Comment: Glucose reference range applies only to samples taken after fasting for at least 8 hours.   BUN 14 6 - 20 mg/dL   Creatinine, Ser 8.11 0.61 - 1.24 mg/dL   Calcium 8.9 8.9 - 91.4 mg/dL   Total Protein 8.3 (H) 6.5 - 8.1 g/dL   Albumin 5.0 3.5 - 5.0 g/dL   AST 39 15 - 41 U/L   ALT 64 (H) 0 - 44 U/L   Alkaline Phosphatase 89 38 - 126 U/L   Total Bilirubin 0.7 <1.2 mg/dL   GFR, Estimated >78 >29 mL/min    Comment: (NOTE) Calculated using the CKD-EPI Creatinine Equation (2021)    Anion gap 10 5 - 15    Comment: Performed at [facility], [address], [place], [place] [address]  Ethanol     Status: None   Collection Time: 01/08/23  7:42 PM  Result Value Ref Range   Alcohol, Ethyl (B) <10 <10 mg/dL    Comment: (NOTE) Lowest detectable limit for serum alcohol is 10 mg/dL.  For medical purposes only. Performed at [facility], [address] [place], [place] [address]   Salicylate level     Status: Abnormal   Collection Time: 01/08/23  7:42 PM  Result Value Ref Range   Salicylate Lvl <7.0 (L) 7.0 - 30.0 mg/dL    Comment: Performed at [facility], [address] [place], [place] [address]  Acetaminophen level     Status: Abnormal   Collection Time: 01/08/23  7:42 PM  Result Value Ref Range   Acetaminophen (Tylenol), Serum <10 (L) 10 - 30 ug/mL    Comment: (NOTE) Therapeutic concentrations vary significantly. A range of 10-30 ug/mL  may be an effective concentration for many patients. However, some  are best treated at concentrations outside of this range. Acetaminophen concentrations >150 ug/mL at 4 hours after ingestion  and >50 ug/mL at 12 hours after ingestion are often associated with  toxic reactions.  Performed at [facility], [address] [place], [place] [address]   cbc     Status: Abnormal   Collection Time: 01/08/23  7:42 PM  Result Value Ref Range   WBC 17.1 (H) 4.0 -  10.5 K/uL   RBC 5.48 4.22 - 5.81 MIL/uL   Hemoglobin 16.5 13.0 - 17.0 g/dL   HCT 91.4 78.2 - 95.6 %   MCV 89.1 80.0 - 100.0 fL   MCH 30.1 26.0 - 34.0 pg   MCHC 33.8 30.0 - 36.0 g/dL   RDW 21.3 08.6 - 57.8 %   Platelets 421 (H) 150 - 400 K/uL   nRBC 0.0 0.0 - 0.2 %    Comment: Performed at [facility], [address] [place], [place] [address]  Urine Drug Screen, Qualitative     Status: Abnormal   Collection Time: 01/08/23 10:08 PM  Result Value Ref Range   Tricyclic, Ur Screen POSITIVE (A) NONE DETECTED   Amphetamines, Ur Screen NONE DETECTED NONE DETECTED   MDMA (Ecstasy)Ur Screen NONE DETECTED NONE DETECTED   Cocaine Metabolite,Ur [place] NONE DETECTED NONE DETECTED   Opiate, Ur Screen NONE DETECTED NONE DETECTED   Phencyclidine (PCP) Ur S NONE DETECTED NONE DETECTED   Cannabinoid 50 Ng, Ur [place] NONE DETECTED NONE DETECTED   Barbiturates, Ur Screen NONE DETECTED NONE DETECTED   Benzodiazepine, Ur Scrn POSITIVE (A) NONE DETECTED   Methadone Scn, Ur NONE DETECTED NONE DETECTED    Comment: (NOTE) Tricyclics + metabolites, urine    Cutoff 1000 ng/mL Amphetamines + metabolites, urine  Cutoff 1000 ng/mL MDMA (Ecstasy), urine              Cutoff 500 ng/mL Cocaine Metabolite, urine          Cutoff 300 ng/mL Opiate + metabolites, urine        Cutoff 300 ng/mL Phencyclidine (PCP), urine         Cutoff 25 ng/mL Cannabinoid, urine                 Cutoff 50 ng/mL Barbiturates + metabolites, urine  Cutoff 200 ng/mL Benzodiazepine, urine              Cutoff 200 ng/mL Methadone, urine  Cutoff 300 ng/mL  The urine drug screen provides only a preliminary, unconfirmed analytical test result and should not be used for non-medical purposes. Clinical consideration and professional judgment should be applied to any positive drug screen result due to possible interfering substances. A more specific alternate chemical method must be used in order to obtain a confirmed  analytical result. Gas chromatography / mass spectrometry (GC/MS) is the preferred confirm atory method. Performed at [facility], [address] [place], [place] [address]     Blood Alcohol level:  Lab Results  Component Value Date   [facility] <10 01/08/2023   ETH <10 12/26/2022    Metabolic Disorder Labs:  Lab Results  Component Value Date   HGBA1C 5.2 12/30/2022   MPG 102.54 12/30/2022   No results found for: "PROLACTIN" Lab Results  Component Value Date   CHOL 145 12/30/2022   TRIG 249 (H) 12/30/2022   HDL 27 (L) 12/30/2022   CHOLHDL 5.4 12/30/2022   VLDL 50 (H) 12/30/2022   LDLCALC 68 12/30/2022    Current Medications: Current Facility-Administered Medications  Medication Dose Route Frequency Provider Last Rate Last Admin   acetaminophen (TYLENOL) tablet 650 mg  650 mg Oral Q6H PRN Jearld Lesch, NP       alum & mag hydroxide-simeth (MAALOX/MYLANTA) 200-200-20 MG/5ML suspension 30 mL  30 mL Oral Q4H PRN Dixon, Rashaun M, NP       haloperidol (HALDOL) tablet 5 mg  5 mg Oral TID PRN Jearld Lesch, NP   5 mg at 01/09/23 1324   And   diphenhydrAMINE (BENADRYL) capsule 50 mg  50 mg Oral TID PRN Jearld Lesch, NP   50 mg at 01/09/23 0353   haloperidol lactate (HALDOL) injection 5 mg  5 mg Intramuscular TID PRN Jearld Lesch, NP       And   diphenhydrAMINE (BENADRYL) injection 50 mg  50 mg Intramuscular TID PRN Jearld Lesch, NP       And   LORazepam (ATIVAN) injection 2 mg  2 mg Intramuscular TID PRN Jearld Lesch, NP       haloperidol lactate (HALDOL) injection 10 mg  10 mg Intramuscular TID PRN Jearld Lesch, NP       And   diphenhydrAMINE (BENADRYL) injection 50 mg  50 mg Intramuscular TID PRN Jearld Lesch, NP       And   LORazepam (ATIVAN) injection 2 mg  2 mg Intramuscular TID PRN Jearld Lesch, NP       escitalopram (LEXAPRO) tablet 10 mg  10 mg Oral Daily Durwin Nora, Rashaun M, NP   10 mg at 01/09/23 4010   gabapentin (NEURONTIN)  capsule 300 mg  300 mg Oral TID Jearld Lesch, NP   300 mg at 01/09/23 2725   hydrOXYzine (ATARAX) tablet 50 mg  50 mg Oral TID PRN Jearld Lesch, NP       magnesium hydroxide (MILK OF MAGNESIA) suspension 30 mL  30 mL Oral Daily PRN Jearld Lesch, NP       OLANZapine (ZYPREXA) tablet 7.5 mg  7.5 mg Oral QHS Dixon, Rashaun M, NP       propranolol (INDERAL) tablet 10 mg  10 mg Oral Daily Dixon, Rashaun M, NP   10 mg at 01/09/23 0906   QUEtiapine (SEROQUEL) tablet 200 mg  200 mg Oral QHS Dixon, Rashaun M, NP       traZODone (DESYREL) tablet 50 mg  50 mg Oral  QHS PRN Jearld Lesch, NP       PTA Medications: Medications Prior to Admission  Medication Sig Dispense Refill Last Dose   escitalopram (LEXAPRO) 10 MG tablet Take 1 tablet (10 mg total) by mouth daily. 30 tablet 0    gabapentin (NEURONTIN) 300 MG capsule Take 1 capsule (300 mg total) by mouth 3 (three) times daily. 90 capsule 0    hydrOXYzine (ATARAX) 50 MG tablet Take 1 tablet (50 mg total) by mouth 3 (three) times daily as needed for anxiety. 30 tablet 0    Multiple Vitamin (MULTIVITAMIN WITH MINERALS) TABS tablet Take 1 tablet by mouth daily. 30 tablet 0    nicotine (NICODERM CQ - DOSED IN MG/24 HOURS) 21 mg/24hr patch Place 1 patch (21 mg total) onto the skin daily for 28 days. 28 patch 0    nicotine polacrilex (NICORETTE) 2 MG gum Take 1 each (2 mg total) by mouth as needed for smoking cessation. 100 tablet 0    OLANZapine (ZYPREXA) 5 MG tablet Take 1.5 tablets (7.5 mg total) by mouth at bedtime. 45 tablet 0    propranolol (INDERAL) 10 MG tablet Take 1 tablet (10 mg total) by mouth daily. 30 tablet 0    QUEtiapine (SEROQUEL) 200 MG tablet Take 1 tablet (200 mg total) by mouth at bedtime. 30 tablet 0    thiamine (VITAMIN B-1) 100 MG tablet Take 1 tablet (100 mg total) by mouth daily. 30 tablet 0    traZODone (DESYREL) 50 MG tablet Take 1 tablet (50 mg total) by mouth at bedtime as needed for sleep. 30 tablet 0     Musculoskeletal: Strength & Muscle Tone: within normal limits Gait & Station: normal Patient leans: N/A   Psychiatric Specialty Exam: Physical Exam Vitals and nursing note reviewed.  Constitutional:      Appearance: Normal appearance.  HENT:     Head: Normocephalic.     Nose: Nose normal.  Pulmonary:     Effort: Pulmonary effort is normal.  Musculoskeletal:        General: Normal range of motion.     Cervical back: Normal range of motion.  Neurological:     General: No focal deficit present.     Mental Status: He is alert and oriented to person, place, and time.       Review of Systems  Psychiatric/Behavioral:  Positive for depression. The patient is nervous/anxious.   All other systems reviewed and are negative.    Blood pressure 135/75, pulse 86, temperature 98.3 F (36.8 C), temperature source Oral, resp. rate 18, height 5\' 8"  (1.727 m), weight 76 kg, SpO2 97%.Body mass index is 25.48 kg/m.  General Appearance: Casual  Eye Contact:  Good  Speech:  Normal Rate  Volume:  Normal  Mood:  Anxious and Depressed  Affect:  Blunt  Thought Process:  Coherent and Descriptions of Associations: Intact  Orientation:  Full (Time, Place, and Person)  Thought Content:  WDL and Logical  Suicidal Thoughts:  No  Homicidal Thoughts:  No  Memory:  Immediate;   Good Recent;   Good Remote;   Good  Judgement:  Fair  Insight:  Fair  Psychomotor Activity:  Normal  Concentration:  Concentration: Good and Attention Span: Good  Recall:  Good  Fund of Knowledge:  Fair  Language:  Good  Akathisia:  No  Handed:  Right  AIMS (if indicated):     Assets:  Leisure Time Physical Health Resilience  ADL's:  Intact  Cognition:  WNL  Sleep:        Physical Exam: Physical Exam Vitals and nursing note reviewed.  Constitutional:      Appearance: Normal appearance.  HENT:     Head: Normocephalic.     Nose: Nose normal.  Pulmonary:     Effort: Pulmonary effort is normal.   Musculoskeletal:        General: Normal range of motion.     Cervical back: Normal range of motion.  Neurological:     General: No focal deficit present.     Mental Status: He is alert and oriented to person, place, and time.    Review of Systems  Psychiatric/Behavioral:  Positive for depression. The patient is nervous/anxious.   All other systems reviewed and are negative.  Blood pressure (!) 155/106, pulse 77, temperature 98.2 F (36.8 C), temperature source Oral, resp. rate 17, height 5\' 7"  (1.702 m), weight 90.7 kg, SpO2 98%. Body mass index is 31.32 kg/m.  Treatment Plan Summary: Daily contact with patient to assess and evaluate symptoms and progress in treatment, Medication management, and Plan : Major depressive disorder, recurrent, severe without psychosis: Lexapro 20 changed to Luvox 50 mg BID   General anxiety disorder and obsessive thinking: Luvox 50 mg BID Gabapentin 300 mg TID  Propranolol 10 mg daily increased to BID Zyprexa 5 mg at bedtime increased to 7.5 mg daily  Observation Level/Precautions:  15 minute checks  Laboratory:  Completed, reviewed, stable  Psychotherapy:  Individual and group therapy  Medications:  See above  Consultations:  none  Discharge Concerns:  none  Estimated LOS: 3-7 days  Other:     Physician Treatment Plan for Primary Diagnosis: Major depressive disorder, recurrent severe without psychotic features (HCC) Long Term Goal(s): Improvement in symptoms so as ready for discharge  Short Term Goals: Ability to identify changes in lifestyle to reduce recurrence of condition will improve, Ability to verbalize feelings will improve, Ability to disclose and discuss suicidal ideas, Ability to demonstrate self-control will improve, Ability to identify and develop effective coping behaviors will improve, Ability to maintain clinical measurements within normal limits will improve, Compliance with prescribed medications will improve, and Ability to  identify triggers associated with substance abuse/mental health issues will improve  Physician Treatment Plan for Secondary Diagnosis: Principal Problem:   Major depressive disorder, recurrent severe without psychotic features (HCC) Active Problems:   Obsessive thinking  Long Term Goal(s): Improvement in symptoms so as ready for discharge  Short Term Goals: Ability to identify changes in lifestyle to reduce recurrence of condition will improve, Ability to verbalize feelings will improve, Ability to disclose and discuss suicidal ideas, Ability to demonstrate self-control will improve, Ability to identify and develop effective coping behaviors will improve, Ability to maintain clinical measurements within normal limits will improve, Compliance with prescribed medications will improve, and Ability to identify triggers associated with substance abuse/mental health issues will improve  I certify that inpatient services furnished can reasonably be expected to improve the patient's condition.    Nanine Means, NP 12/4/20249:54 AM

## 2023-01-09 NOTE — Progress Notes (Signed)
Pt calm and pleasant during assessment denying SI/HI/AVH. Pt stated that he just feels anxious. Pt compliant with medication administration per MD orders. Pt given education, support, and encouragement to be active in his treatment plan. Pt being monitored Q 15 minutes for safety per unit protocol, remains safe on the unit
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
## 2023-01-09 NOTE — Progress Notes (Signed)
Pt agitated and irritable during assessment. This Clinical research associate gave PRN medications for agitation. Pt complaining that when he falls asleep he wakes up shortly after in a panic.

## 2023-01-09 NOTE — Group Note (Signed)
Date:  01/09/2023 Time:  4:04 PM  Group Topic/Focus:  Activity Group:  The focus of the group is to encourage patients to go outside in the courtyard and get some fresh air and some exercise.    Participation Level:  Did Not Attend   Ronald [name] 01/09/2023, 4:04 PM

## 2023-01-09 NOTE — Group Note (Signed)
Date:  01/09/2023 Time:  9:15 PM  Group Topic/Focus:  Rediscovering Joy:   The focus of this group is to explore various ways to relieve stress in a positive manner.    Participation Level:  Did Not Attend  Participation Quality:   none  Affect:   none  Cognitive:   none  Insight: None  Engagement in Group:   none  Modes of Intervention:   none  Additional Comments:  none   Ruperto Kiernan 01/09/2023, 9:15 PM

## 2023-01-09 NOTE — [facility] Suicide Risk Assessment (Signed)
[facility] INPATIENT:  Family/Significant Other Suicide Prevention Education  Suicide Prevention Education:  Education Completed; Dewayne Hatch, mom, [phone], has been identified by the patient as the family member/significant other with whom the patient will be residing, and identified as the person(s) who will aid the patient in the event of a mental health crisis (suicidal ideations/suicide attempt).  With written consent from the patient, the family member/significant other has been provided the following suicide prevention education, prior to the and/or following the discharge of the patient.  The suicide prevention education provided includes the following: Suicide risk factors Suicide prevention and interventions National Suicide Hotline telephone number [facility] assessment telephone number [facility] Emergency Assistance [facility] and/or Residential Mobile Crisis Unit telephone number  Request made of family/significant other to: Remove weapons (e.g., guns, rifles, knives), all items previously/currently identified as safety concern.   Remove drugs/medications (over-the-counter, prescriptions, illicit drugs), all items previously/currently identified as a safety concern.  The family member/significant other verbalizes understanding of the suicide prevention education information provided.  The family member/significant other agrees to remove the items of safety concern listed above.  Lowry Ram 01/09/2023, 10:44 AM

## 2023-01-09 NOTE — Plan of Care (Signed)
  Problem: Education: Goal: Knowledge of [facility] Education information/materials will improve Outcome: Not Progressing Goal: Emotional status will improve Outcome: Not Progressing Goal: Mental status will improve Outcome: Not Progressing Goal: Verbalization of understanding the information provided will improve Outcome: Not Progressing   Problem: Activity: Goal: Interest or engagement in activities will improve Outcome: Not Progressing Goal: Sleeping patterns will improve Outcome: Not Progressing   Problem: Coping: Goal: Ability to verbalize frustrations and anger appropriately will improve Outcome: Not Progressing Goal: Ability to demonstrate self-control will improve Outcome: Not Progressing   Problem: Health Behavior/Discharge Planning: Goal: Identification of resources available to assist in meeting health care needs will improve Outcome: Not Progressing Goal: Compliance with treatment plan for underlying cause of condition will improve Outcome: Not Progressing   Problem: Physical Regulation: Goal: Ability to maintain clinical measurements within normal limits will improve Outcome: Not Progressing   Problem: Safety: Goal: Periods of time without injury will increase Outcome: Not Progressing  D- Patient alert and oriented. Affect/mood. Denies SI/ HI/ AVH. Patient denies pain. Patient endorses depression and anxiety. Goal of the day. A- Scheduled medications administered to patient, per MD orders. Support and encouragement provided.  Routine safety checks conducted every 15 minutes without incident.  Patient informed to notify staff with problems or concerns and verbalizes understanding. R- No adverse drug reactions noted.  Patient compliant with medications and treatment plan. Patient receptive, calm cooperative and interacts well with others on the unit.  Patient contracts for safety and  remains safe on the unit at this time.

## 2023-01-09 NOTE — Group Note (Signed)
LCSW Group Therapy Note   Group Date: 01/09/2023 Start Time: 1315 End Time: 1415   Type of Therapy and Topic:  Group Therapy: Boundaries  Participation Level:  Did Not Attend  Description of Group: This group will address the use of boundaries in their personal lives. Patients will explore why boundaries are important, the difference between healthy and unhealthy boundaries, and negative and postive outcomes of different boundaries and will look at how boundaries can be crossed.  Patients will be encouraged to identify current boundaries in their own lives and identify what kind of boundary is being set. Facilitators will guide patients in utilizing problem-solving interventions to address and correct types boundaries being used and to address when no boundary is being used. Understanding and applying boundaries will be explored and addressed for obtaining and maintaining a balanced life. Patients will be encouraged to explore ways to assertively make their boundaries and needs known to significant others in their lives, using other group members and facilitator for role play, support, and feedback.  Therapeutic Goals:  1.  Patient will identify areas in their life where setting clear boundaries could be  used to improve their life.  2.  Patient will identify signs/triggers that a boundary is not being respected. 3.  Patient will identify two ways to set boundaries in order to achieve balance in  their lives: 4.  Patient will demonstrate ability to communicate their needs and set boundaries  through discussion and/or role plays  Summary of Patient Progress:   X  Therapeutic Modalities:   Cognitive Behavioral Therapy Solution-Focused Therapy  Harden Mo, LCSWA 01/09/2023  3:02 PM

## 2023-01-09 NOTE — Progress Notes (Signed)
D- Patient alert and oriented x 4. Affect anxious/mood irritable. Denies SI/ HI/ AVH. Patient denies pain. He states he is just "afraid of when his head gets all mixed up and what he might do; I am afraid I have lost my life". Patient endorses depression and anxiety. His goal is to "hopefully get my medications right". He was isolative to his room today except for meals. A- Scheduled medications administered to patient, per MD orders. Support and encouragement provided.  Routine safety checks conducted every 15 minutes without incident.  Patient informed to notify staff with problems or concerns and verbalizes understanding. R- No adverse drug reactions noted.  Patient compliant with medications and treatment plan. Patient receptive and cooperative. Patient contracts for safety and  remains safe on the unit at this time.
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
## 2023-01-09 NOTE — BHH Suicide Risk Assessment (Signed)
[facility] Admission Suicide Risk Assessment   Nursing information obtained from:  Patient Demographic factors:  Male, Caucasian Current Mental Status:  NA Loss Factors:  NA Historical Factors:  NA Risk Reduction Factors:  Positive social support  Total Time spent with patient: 1 hour Principal Problem: Major depressive disorder, recurrent severe without psychotic features (HCC) Diagnosis:  Principal Problem:   Major depressive disorder, recurrent severe without psychotic features (HCC) Active Problems:   Obsessive thinking  Subjective Data: "I was having bothering thoughts of hurting my grandbaby"  Continued Clinical Symptoms:  Alcohol Use Disorder Identification Test Final Score (AUDIT): 0 The "Alcohol Use Disorders Identification Test", Guidelines for Use in Primary Care, Second Edition.  World Science writer [facility]). Score between 0-7:  no or low risk or alcohol related problems. Score between 8-15:  moderate risk of alcohol related problems. Score between 16-19:  high risk of alcohol related problems. Score 20 or above:  warrants further diagnostic evaluation for alcohol dependence and treatment.   CLINICAL FACTORS:   Depression:   Anhedonia: high anxiety with obsessive thinking   Musculoskeletal: Strength & Muscle Tone: within normal limits Gait & Station: normal Patient leans: N/A  Psychiatric Specialty Exam: Physical Exam Vitals and nursing note reviewed.  Constitutional:      Appearance: Normal appearance.  HENT:     Head: Normocephalic.     Nose: Nose normal.  Pulmonary:     Effort: Pulmonary effort is normal.  Musculoskeletal:        General: Normal range of motion.     Cervical back: Normal range of motion.  Neurological:     General: No focal deficit present.     Mental Status: He is alert and oriented to person, place, and time.     Review of Systems  Psychiatric/Behavioral:  Positive for depression. The patient is nervous/anxious.   All other  systems reviewed and are negative.   Blood pressure (!) 155/106, pulse 77, temperature 98.2 F (36.8 C), temperature source Oral, resp. rate 17, height 5\' 7"  (1.702 m), weight 90.7 kg, SpO2 98%.Body mass index is 31.32 kg/m.  General Appearance: Casual  Eye Contact:  Fair  Speech:  Normal Rate  Volume:  Normal  Mood:  Anxious and Depressed  Affect:  Congruent  Thought Process:  Coherent  Orientation:  Full (Time, Place, and Person)  Thought Content:  Rumination  Suicidal Thoughts:  No  Homicidal Thoughts:  No  Memory:  Immediate;   Fair Recent;   Fair Remote;   Fair  Judgement:  Fair  Insight:  Fair  Psychomotor Activity:  Normal  Concentration:  Concentration: Fair and Attention Span: Fair  Recall:  [employer] of Knowledge:  Fair  Language:  Good  Akathisia:  No  Handed:  Right  AIMS (if indicated):     Assets:  Housing Leisure Time Physical Health Resilience Social Support  ADL's:  Intact  Cognition:  WNL  Sleep:         Physical Exam: Physical Exam Vitals and nursing note reviewed.  Constitutional:      Appearance: Normal appearance.  HENT:     Head: Normocephalic.     Nose: Nose normal.  Pulmonary:     Effort: Pulmonary effort is normal.  Musculoskeletal:        General: Normal range of motion.     Cervical back: Normal range of motion.  Neurological:     General: No focal deficit present.     Mental Status: He is alert  and oriented to person, place, and time.    Review of Systems  Psychiatric/Behavioral:  Positive for depression. The patient is nervous/anxious.   All other systems reviewed and are negative.  Blood pressure (!) 155/106, pulse 77, temperature 98.2 F (36.8 C), temperature source Oral, resp. rate 17, height 5\' 7"  (1.702 m), weight 90.7 kg, SpO2 98%. Body mass index is 31.32 kg/m.   COGNITIVE FEATURES THAT CONTRIBUTE TO RISK:  None    SUICIDE RISK:   Mild:  Suicidal ideation of limited frequency, intensity, duration, and  specificity.  There are no identifiable plans, no associated intent, mild dysphoria and related symptoms, good self-control (both objective and subjective assessment), few other risk factors, and identifiable protective factors, including available and accessible social support.  PLAN OF CARE:  Major depressive disorder, recurrent, severe without psychosis: Lexapro 20 changed to Luvox 50 mg BID   General anxiety disorder and obsessive thinking: Luvox 50 mg BID Gabapentin 300 mg TID  Propranolol 10 mg daily increased to BID Zyprexa 5 mg at bedtime increased to 7.5 mg daily  I certify that inpatient services furnished can reasonably be expected to improve the patient's condition.   Nanine Means, NP 01/09/2023, 9:54 AM

## 2023-01-09 NOTE — Progress Notes (Signed)
Pt admitted from [facility], report received from [place], [place]. Pt irritable during assessment stating, "I don't know why they would discharge me if I wasn't ready?" Pt given education and support. Pt stated he has been having panic attacks and its causing him to not be able to sleep. Pt given PRN medications, check MAR. Ativan was withheld by this writer due to his abuse of benzodiazepines recently. Pt oriented to the unit and his room. Pt given education, support, and encouragement to be active in his treatment plan. Pt being monitored Q 15 minutes for safety per unit protocol, remains safe on the unit

## 2023-01-09 NOTE — Plan of Care (Signed)
New admission  Problem: Education: Goal: Knowledge of [facility] Education information/materials will improve Outcome: Not Progressing Goal: Verbalization of understanding the information provided will improve Outcome: Not Progressing   Problem: Activity: Goal: Interest or engagement in activities will improve Outcome: Not Progressing   Problem: Health Behavior/Discharge Planning: Goal: Identification of resources available to assist in meeting health care needs will improve Outcome: Not Progressing   Problem: Physical Regulation: Goal: Ability to maintain clinical measurements within normal limits will improve Outcome: Not Progressing

## 2023-01-09 NOTE — Tx Team (Signed)
Initial Treatment Plan 01/09/2023 3:59 AM [name]. UEA:[id number]    PATIENT STRESSORS: Marital or family conflict   Substance abuse     PATIENT STRENGTHS: Supportive family/friends    PATIENT IDENTIFIED PROBLEMS: Substance abuse                      DISCHARGE CRITERIA:  Ability to meet basic life and health needs Adequate post-discharge living arrangements Reduction of life-threatening or endangering symptoms to within safe limits Withdrawal symptoms are absent or subacute and managed without 24-hour nursing intervention  PRELIMINARY DISCHARGE PLAN: Attend aftercare/continuing care group  PATIENT/FAMILY INVOLVEMENT: This treatment plan has been presented to and reviewed with the patient, Ronald [name]. The patient and family have been given the opportunity to ask questions and make suggestions.  Neysa Bonito, RN 01/09/2023, 3:59 AM

## 2023-01-09 NOTE — BH IP Treatment Plan (Signed)
Interdisciplinary Treatment and Diagnostic Plan Update  01/09/2023 Time of Session: 08:56 Ronald [name]. MRN: [id number]  Principal Diagnosis: MDD (major depressive disorder), recurrent, severe, with psychosis (HCC)  Secondary Diagnoses: Principal Problem:   MDD (major depressive disorder), recurrent, severe, with psychosis (HCC)   Current Medications:  Current Facility-Administered Medications  Medication Dose Route Frequency Provider Last Rate Last Admin   acetaminophen (TYLENOL) tablet 650 mg  650 mg Oral Q6H PRN Dixon, Rashaun M, NP       alum & mag hydroxide-simeth (MAALOX/MYLANTA) 200-200-20 MG/5ML suspension 30 mL  30 mL Oral Q4H PRN Dixon, Rashaun M, NP       haloperidol (HALDOL) tablet 5 mg  5 mg Oral TID PRN Jearld Lesch, NP   5 mg at 01/09/23 9562   And   diphenhydrAMINE (BENADRYL) capsule 50 mg  50 mg Oral TID PRN Jearld Lesch, NP   50 mg at 01/09/23 0353   haloperidol lactate (HALDOL) injection 5 mg  5 mg Intramuscular TID PRN Jearld Lesch, NP       And   diphenhydrAMINE (BENADRYL) injection 50 mg  50 mg Intramuscular TID PRN Jearld Lesch, NP       And   LORazepam (ATIVAN) injection 2 mg  2 mg Intramuscular TID PRN Jearld Lesch, NP       haloperidol lactate (HALDOL) injection 10 mg  10 mg Intramuscular TID PRN Jearld Lesch, NP       And   diphenhydrAMINE (BENADRYL) injection 50 mg  50 mg Intramuscular TID PRN Jearld Lesch, NP       And   LORazepam (ATIVAN) injection 2 mg  2 mg Intramuscular TID PRN Jearld Lesch, NP       escitalopram (LEXAPRO) tablet 10 mg  10 mg Oral Daily Durwin Nora, Rashaun M, NP   10 mg at 01/09/23 1308   gabapentin (NEURONTIN) capsule 300 mg  300 mg Oral TID Jearld Lesch, NP   300 mg at 01/09/23 6578   hydrOXYzine (ATARAX) tablet 50 mg  50 mg Oral TID PRN Jearld Lesch, NP       magnesium hydroxide (MILK OF MAGNESIA) suspension 30 mL  30 mL Oral Daily PRN Jearld Lesch, NP       OLANZapine (ZYPREXA) tablet 7.5  mg  7.5 mg Oral QHS Dixon, Rashaun M, NP       propranolol (INDERAL) tablet 10 mg  10 mg Oral Daily Durwin Nora, Rashaun M, NP   10 mg at 01/09/23 4696   QUEtiapine (SEROQUEL) tablet 200 mg  200 mg Oral QHS Dixon, Rashaun M, NP       traZODone (DESYREL) tablet 50 mg  50 mg Oral QHS PRN Jearld Lesch, NP       PTA Medications: Medications Prior to Admission  Medication Sig Dispense Refill Last Dose   escitalopram (LEXAPRO) 10 MG tablet Take 1 tablet (10 mg total) by mouth daily. 30 tablet 0    gabapentin (NEURONTIN) 300 MG capsule Take 1 capsule (300 mg total) by mouth 3 (three) times daily. 90 capsule 0    hydrOXYzine (ATARAX) 50 MG tablet Take 1 tablet (50 mg total) by mouth 3 (three) times daily as needed for anxiety. 30 tablet 0    Multiple Vitamin (MULTIVITAMIN WITH MINERALS) TABS tablet Take 1 tablet by mouth daily. 30 tablet 0    nicotine (NICODERM CQ - DOSED IN MG/24 HOURS) 21 mg/24hr patch Place 1 patch (21 mg  total) onto the skin daily for 28 days. 28 patch 0    nicotine polacrilex (NICORETTE) 2 MG gum Take 1 each (2 mg total) by mouth as needed for smoking cessation. 100 tablet 0    OLANZapine (ZYPREXA) 5 MG tablet Take 1.5 tablets (7.5 mg total) by mouth at bedtime. 45 tablet 0    propranolol (INDERAL) 10 MG tablet Take 1 tablet (10 mg total) by mouth daily. 30 tablet 0    QUEtiapine (SEROQUEL) 200 MG tablet Take 1 tablet (200 mg total) by mouth at bedtime. 30 tablet 0    thiamine (VITAMIN B-1) 100 MG tablet Take 1 tablet (100 mg total) by mouth daily. 30 tablet 0    traZODone (DESYREL) 50 MG tablet Take 1 tablet (50 mg total) by mouth at bedtime as needed for sleep. 30 tablet 0     Patient Stressors: Marital or family conflict   Substance abuse    Patient Strengths: Supportive family/friends   Treatment Modalities: Medication Management, Group therapy, Case management,  1 to 1 session with clinician, Psychoeducation, Recreational therapy.   Physician Treatment Plan for Primary  Diagnosis: MDD (major depressive disorder), recurrent, severe, with psychosis (HCC) Long Term Goal(s):     Short Term Goals:    Medication Management: Evaluate patient's response, side effects, and tolerance of medication regimen.  Therapeutic Interventions: 1 to 1 sessions, Unit Group sessions and Medication administration.  Evaluation of Outcomes: Not Met  Physician Treatment Plan for Secondary Diagnosis: Principal Problem:   MDD (major depressive disorder), recurrent, severe, with psychosis (HCC)  Long Term Goal(s):     Short Term Goals:       Medication Management: Evaluate patient's response, side effects, and tolerance of medication regimen.  Therapeutic Interventions: 1 to 1 sessions, Unit Group sessions and Medication administration.  Evaluation of Outcomes: Not Met   RN Treatment Plan for Primary Diagnosis: MDD (major depressive disorder), recurrent, severe, with psychosis (HCC) Long Term Goal(s): Knowledge of disease and therapeutic regimen to maintain health will improve  Short Term Goals: Ability to remain free from injury will improve, Ability to verbalize frustration and anger appropriately will improve, Ability to demonstrate self-control, Ability to participate in decision making will improve, Ability to verbalize feelings will improve, Ability to disclose and discuss suicidal ideas, Ability to identify and develop effective coping behaviors will improve, and Compliance with prescribed medications will improve  Medication Management: RN will administer medications as ordered by provider, will assess and evaluate patient's response and provide education to patient for prescribed medication. RN will report any adverse and/or side effects to prescribing provider.  Therapeutic Interventions: 1 on 1 counseling sessions, Psychoeducation, Medication administration, Evaluate responses to treatment, Monitor vital signs and CBGs as ordered, Perform/monitor CIWA, COWS, AIMS and  Fall Risk screenings as ordered, Perform wound care treatments as ordered.  Evaluation of Outcomes: Not Met   LCSW Treatment Plan for Primary Diagnosis: MDD (major depressive disorder), recurrent, severe, with psychosis (HCC) Long Term Goal(s): Safe transition to appropriate next level of care at discharge, Engage patient in therapeutic group addressing interpersonal concerns.  Short Term Goals: Engage patient in aftercare planning with referrals and resources, Increase social support, Increase ability to appropriately verbalize feelings, Increase emotional regulation, Facilitate acceptance of mental health diagnosis and concerns, Facilitate patient progression through stages of change regarding substance use diagnoses and concerns, Identify triggers associated with mental health/substance abuse issues, and Increase skills for wellness and recovery  Therapeutic Interventions: Assess for all discharge needs, 1 to 1  time with Child psychotherapist, Explore available resources and support systems, Assess for adequacy in community support network, Educate family and significant other(s) on suicide prevention, Complete Psychosocial Assessment, Interpersonal group therapy.  Evaluation of Outcomes: Not Met   Progress in Treatment: Attending groups: No. Participating in groups: No. Taking medication as prescribed: Yes. Toleration medication: Yes. Family/Significant other contact made: No, will contact:  if given permission. Patient understands diagnosis: Yes. Discussing patient identified problems/goals with staff: Yes. Medical problems stabilized or resolved: Yes. Denies suicidal/homicidal ideation: No. Issues/concerns per patient self-inventory: Yes. Other: none.  New problem(s) identified: No, Describe:  none identified  New Short Term/Long Term Goal(s): medication management for mood stabilization; elimination of SI thoughts; development of comprehensive mental wellness/sobriety plan.   Patient  Goals:  Pt reports continued intrusive thoughts of wanting to hurt others.  Discharge Plan or Barriers: CSW will assist pt with development of an appropriate aftercare/discharge plan.   Reason for Continuation of Hospitalization: Anxiety Depression Homicidal ideation Medication stabilization Suicidal ideation  Estimated Length of Stay: 1-7 days  Last 3 [place] Suicide Severity Risk Score: Flowsheet Row Admission (Current) from 01/09/2023 in [facility] INPATIENT BE[facility] ED from 01/08/2023 in [facility] Emergency Department at [facility] Admission (Discharged) from 12/26/2022 in [facility] INPATIENT BE[facility]AL MEDICINE  C-SSRS RISK CATEGORY Low Risk Moderate Risk No Risk       Last PHQ 2/9 Scores:     No data to display          Scribe for Treatment Team: Glenis Smoker, LCSW 01/09/2023 9:24 AM

## 2023-01-10 DIAGNOSIS — F332 Major depressive disorder, recurrent severe without psychotic features: Secondary | ICD-10-CM | POA: Diagnosis not present

## 2023-01-10 MED ORDER — NICOTINE POLACRILEX 2 MG MT GUM
2.0000 mg | CHEWING_GUM | OROMUCOSAL | Status: DC | PRN
  Administered 2023-01-10 – 2023-01-22 (×15): 2 mg via ORAL
  Filled 2023-01-10 (×17): qty 1

## 2023-01-10 NOTE — Progress Notes (Signed)
D- Patient alert and oriented x 4. Affect blunted and wide eyed/mood anxious. Denies SI/ HI/ AVH. Patient denies pain. Patient endorses depression and anxiety. He states his "head" and "thoughts" are getting better. Began Luvox with no adverse side effects and potentially positive ones. A- Scheduled medications administered to patient, per MD orders. Support and encouragement provided.  Routine safety checks conducted every 15 minutes without incident.  Patient informed to notify staff with problems or concerns and verbalizes understanding. R- No adverse drug reactions noted.  Patient compliant with medications and treatment plan. Patient receptive, calm and cooperative. He is isolative to his room but did attend part of the afternoon therapy group and is out of his room for meals.  Patient contracts for safety and  remains safe on the unit at this time.

## 2023-01-10 NOTE — Group Note (Signed)
LCSW Group Therapy Note   Group Date: 01/10/2023 Start Time: 1300 End Time: 1400   Type of Therapy and Topic:  Group Therapy: Challenging Core Beliefs  Participation Level:  Active  Description of Group:  Patients were educated about core beliefs and asked to identify one harmful core belief that they have. Patients were asked to explore from where those beliefs originate. Patients were asked to discuss how those beliefs make them feel and the resulting behaviors of those beliefs. They were then be asked if those beliefs are true and, if so, what evidence they have to support them. Lastly, group members were challenged to replace those negative core beliefs with helpful beliefs.   Therapeutic Goals:   1. Patient will identify harmful core beliefs and explore the origins of such beliefs. 2. Patient will identify feelings and behaviors that result from those core beliefs. 3. Patient will discuss whether such beliefs are true. 4.  Patient will replace harmful core beliefs with helpful ones.  Summary of Patient Progress:  Patient actively engaged in processing and exploring how core beliefs are formed and how they impact thoughts, feelings, and behaviors. Patient proved open to input from peers and feedback from CSW. Patient demonstrated proficient insight into the subject matter, was respectful and supportive of peers, and participated throughout the entire session.  Therapeutic Modalities: Cognitive Behavioral Therapy; Solution-Focused Therapy   Lowry Ram, LCSWA 01/10/2023  2:18 PM

## 2023-01-10 NOTE — Plan of Care (Signed)

## 2023-01-10 NOTE — Group Note (Signed)
Date:  01/10/2023 Time:  12:28 PM  Group Topic/Focus:  Coping With Mental Health Crisis:   The purpose of this group is to help patients identify strategies for coping with mental health crisis.  Group discusses possible causes of crisis and ways to manage them effectively.    Participation Level:  Did Not Attend   Mary Sella Shyquan Stallbaumer 01/10/2023, 12:28 PM

## 2023-01-10 NOTE — Plan of Care (Signed)
 Pt compliant with procedures on the unit  Problem: Education: Goal: Knowledge of [facility] Education information/materials will improve Outcome: Progressing Goal: Emotional status will improve Outcome: Progressing Goal: Mental status will improve Outcome: Progressing Goal: Verbalization of understanding the information provided will improve Outcome: Progressing   Problem: Activity: Goal: Interest or engagement in activities will improve Outcome: Progressing Goal: Sleeping patterns will improve Outcome: Progressing   Problem: Coping: Goal: Ability to verbalize frustrations and anger appropriately will improve Outcome: Progressing Goal: Ability to demonstrate self-control will improve Outcome: Progressing   Problem: Health Behavior/Discharge Planning: Goal: Identification of resources available to assist in meeting health care needs will improve Outcome: Progressing Goal: Compliance with treatment plan for underlying cause of condition will improve Outcome: Progressing   Problem: Physical Regulation: Goal: Ability to maintain clinical measurements within normal limits will improve Outcome: Progressing   Problem: Safety: Goal: Periods of time without injury will increase Outcome: Progressing

## 2023-01-10 NOTE — Group Note (Signed)
Recreation Therapy Group Note   Group Topic:Goal Setting  Group Date: 01/10/2023 Start Time: 1000 End Time: 1100 Facilitators: Rosina Lowenstein, LRT, CTRS Location:  Craft Room  Group Description: Product/process development scientist. Patients were given many different magazines, a glue stick, markers, and a piece of cardstock paper. LRT and pts discussed the importance of having goals in life. LRT and pts discussed the difference between short-term and long-term goals, as well as what a SMART goal is. LRT encouraged pts to create a vision board, with images they picked and then cut out with safety scissors from the magazine, for themselves, that capture their short and long-term goals. LRT encouraged pts to show and explain their vision board to the group.   Goal Area(s) Addressed:  Patient will gain knowledge of short vs. long term goals.  Patient will identify goals for themselves. Patient will practice setting SMART goals. Patient will verbalize their goals to LRT and peers.   Affect/Mood: Appropriate and Full range   Participation Level: Moderate   Participation Quality: Independent   Behavior: Cooperative   Speech/Thought Process: Coherent   Insight: Fair   Judgement: Fair    Modes of Intervention: Art, Education, and Guided Discussion   Patient Response to Interventions:  Receptive   Education Outcome:  Acknowledges education   Clinical Observations/Individualized Feedback: Ronald [name] was somewhat active in their participation of session activities and group discussion. Pt was in and out of group multiple times. Pt shared that his goal was: "to take out the trash, you all know what I mean by that". Pt cut out one small image and glued it to the paper. Pt became tearful and shared that he didn't want anyone in the group to feel scared but that he was having suicidal and homicidal thoughts and that he didn't want to hurt anyone. Pt mentioned having withdrawal symptoms.   Plan: Continue to engage  patient in RT group sessions 2-3x/week.   Rosina Lowenstein, LRT, CTRS 01/10/2023 12:06 PM

## 2023-01-10 NOTE — Progress Notes (Signed)
Pt calm and pleasant during assessment denying SI/HI/AVH. Pt stated that he just feels anxious. Pt compliant with medication administration per MD orders. Pt given education, support, and encouragement to be active in his treatment plan. Pt being monitored Q 15 minutes for safety per unit protocol, remains safe on the unit

## 2023-01-10 NOTE — Group Note (Signed)
Date:  01/10/2023 Time:  9:35 PM  Group Topic/Focus:  Self Care:   The focus of this group is to help patients understand the importance of self-care in order to improve or restore emotional, physical, spiritual, interpersonal, and financial health.    Participation Level:  Minimal  Participation Quality:  Appropriate, Attentive, Sharing, and Supportive  Affect:  Appropriate  Cognitive:  Appropriate  Insight: Appropriate and Limited  Engagement in Group:  Limited and Supportive  Modes of Intervention:  Socialization and Support  Additional Comments:     [name] 01/10/2023, 9:35 PM

## 2023-01-10 NOTE — Group Note (Signed)
Recreation Therapy Group Note   Group Topic:Communication  Group Date: 01/10/2023 Start Time: 1530 End Time: 1630 Facilitators: Clinton Gallant, CTRS Location:  Craft Room  Group Description: Emotional Check in. Patient sat and talked with LRT about how they are doing and whatever else is on their mind. LRT provided active listening, reassurance and encouragement. Pts were given the opportunity to listen to music or make holiday themed arts and crafts.   Goal Area(s) Addressed: Patient will engage in conversation with LRT. Patient will communicate their wants, needs, or questions.  Patient will practice a new coping skill of "talking to someone".   Affect/Mood: N/A   Participation Level: Did not attend    Clinical Observations/Individualized Feedback: Patient did not attend group.  Plan: Continue to engage patient in RT group sessions 2-3x/week.   Rosina Lowenstein, LRT, CTRS 01/10/2023 5:44 PM

## 2023-01-10 NOTE — Progress Notes (Signed)
[facility] MD Progress Note  01/10/2023 5:54 AM Ronald [name].  MRN:  [id number]  Subjective:   Ronald [name] is a 47 yo caucasian male with a history of anxiety and major depressive disorder. Notes, labs, and vital signs reviewed.  He presents to the ED with complaints of racing thoughts to hurt other people. Today patient states he isn't feeling too well because he still has random homicidal ideations, no intentions or plans. He did attend one group today and encouraged him to interact in the day room to assist with diverting his thoughts. He states he has been having these thoughts since mid November. He denies any triggers. Patient voices wanting to attend an intensive outpatient program. He stated his sleep pattern is poor. Appetite is good.  No side effects from his medications. He denies SI and AVH.   Upon assessment patient is standing in his room drinking a Gatorade. He is alert and oriented X's 4. He is anxious and cooperative. He maintains good eye contact. He even becomes tearful at one point. His speech is clear and logical. Thought processes are goal oriented.     Principal Problem: Major depressive disorder, recurrent severe without psychotic features (HCC) Diagnosis:   Total Time spent with patient: 20 minutes  Past Psychiatric History: Generalized anxiety disorder, Major depressive disorder  Past Medical History:  Past Medical History:  Diagnosis Date   Anxiety    Depression    Family history of adverse reaction to anesthesia    difficult to wake up - Mother   GERD (gastroesophageal reflux disease)    PTSD (post-traumatic stress disorder)     Past Surgical History:  Procedure Laterality Date   ELECTROCONVULSIVE THERAPY     FRACTURE SURGERY     wrist left   INSERTION OF MESH  08/25/2021   Procedure: INSERTION OF MESH;  Surgeon: Sung Amabile, DO;  Location: [facility] ORS;  Service: General;;   UMBILICAL HERNIA REPAIR  08/25/2021   Procedure: HERNIA REPAIR UMBILICAL ADULT;  Surgeon:  Sung Amabile, DO;  Location: [facility] ORS;  Service: General;;   Family History: History reviewed. No pertinent family history. Family Psychiatric  History: family history of dementia Social History:  Social History   Substance and Sexual Activity  Alcohol Use Yes   Comment: occasional     Social History   Substance and Sexual Activity  Drug Use No    Social History   Socioeconomic History   Marital status: Single    Spouse name: Not on file   Number of children: Not on file   Years of education: Not on file   Highest education level: Not on file  Occupational History   Not on file  Tobacco Use   Smoking status: Every Day    Current packs/day: 1.00    Average packs/day: 1 pack/day for 24.9 years (24.9 ttl pk-yrs)    Types: Cigarettes    Start date: 2000   Smokeless tobacco: Never  Vaping Use   Vaping status: Every Day  Substance and Sexual Activity   Alcohol use: Yes    Comment: occasional   Drug use: No   Sexual activity: Not on file  Other Topics Concern   Not on file  Social History Narrative   Not on file   Social Determinants of Health   Financial Resource Strain: Medium Risk (12/10/2022)   Received from [facility]   Overall Financial Resource Strain (CARDIA)    Difficulty of Paying Living Expenses: Somewhat hard  Food  Insecurity: No Food Insecurity (01/09/2023)   Hunger Vital Sign    Worried About Running Out of Food in the Last Year: Never true    Ran Out of Food in the Last Year: Never true  Transportation Needs: No Transportation Needs (01/09/2023)   PRAPARE - Administrator, Civil Service (Medical): No    Lack of Transportation (Non-Medical): No  Physical Activity: Not on file  Stress: Not on file  Social Connections: Not on file   Additional Social History:                         Sleep: Fair  Appetite:  Fair  Current Medications: Current Facility-Administered Medications  Medication Dose Route Frequency Provider  Last Rate Last Admin   acetaminophen (TYLENOL) tablet 650 mg  650 mg Oral Q6H PRN Dixon, Rashaun M, NP       alum & mag hydroxide-simeth (MAALOX/MYLANTA) 200-200-20 MG/5ML suspension 30 mL  30 mL Oral Q4H PRN Dixon, Rashaun M, NP       haloperidol (HALDOL) tablet 5 mg  5 mg Oral TID PRN Jearld Lesch, NP   5 mg at 01/09/23 1610   And   diphenhydrAMINE (BENADRYL) capsule 50 mg  50 mg Oral TID PRN Jearld Lesch, NP   50 mg at 01/09/23 2103   haloperidol lactate (HALDOL) injection 5 mg  5 mg Intramuscular TID PRN Jearld Lesch, NP       And   diphenhydrAMINE (BENADRYL) injection 50 mg  50 mg Intramuscular TID PRN Jearld Lesch, NP       And   LORazepam (ATIVAN) injection 2 mg  2 mg Intramuscular TID PRN Jearld Lesch, NP       haloperidol lactate (HALDOL) injection 10 mg  10 mg Intramuscular TID PRN Jearld Lesch, NP       And   diphenhydrAMINE (BENADRYL) injection 50 mg  50 mg Intramuscular TID PRN Jearld Lesch, NP       And   LORazepam (ATIVAN) injection 2 mg  2 mg Intramuscular TID PRN Durwin Nora, Rashaun M, NP       fluvoxaMINE (LUVOX) tablet 50 mg  50 mg Oral BID Charm Rings, NP   50 mg at 01/09/23 1826   gabapentin (NEURONTIN) capsule 300 mg  300 mg Oral TID Jearld Lesch, NP   300 mg at 01/09/23 1640   hydrOXYzine (ATARAX) tablet 50 mg  50 mg Oral TID PRN Jearld Lesch, NP       magnesium hydroxide (MILK OF MAGNESIA) suspension 30 mL  30 mL Oral Daily PRN Jearld Lesch, NP       OLANZapine (ZYPREXA) tablet 7.5 mg  7.5 mg Oral QHS Dixon, Rashaun M, NP   7.5 mg at 01/09/23 2101   propranolol (INDERAL) tablet 10 mg  10 mg Oral BID Charm Rings, NP   10 mg at 01/09/23 1640    Lab Results:  Results for orders placed or performed during the hospital encounter of 01/08/23 (from the past 48 hour(s))  Comprehensive metabolic panel     Status: Abnormal   Collection Time: 01/08/23  7:42 PM  Result Value Ref Range   Sodium 139 135 - 145 mmol/L   Potassium 3.8  3.5 - 5.1 mmol/L   Chloride 105 98 - 111 mmol/L   CO2 24 22 - 32 mmol/L   Glucose, Bld 114 (H) 70 - 99 mg/dL  Comment: Glucose reference range applies only to samples taken after fasting for at least 8 hours.   BUN 14 6 - 20 mg/dL   Creatinine, Ser 1.02 0.61 - 1.24 mg/dL   Calcium 8.9 8.9 - 72.5 mg/dL   Total Protein 8.3 (H) 6.5 - 8.1 g/dL   Albumin 5.0 3.5 - 5.0 g/dL   AST 39 15 - 41 U/L   ALT 64 (H) 0 - 44 U/L   Alkaline Phosphatase 89 38 - 126 U/L   Total Bilirubin 0.7 <1.2 mg/dL   GFR, Estimated >36 >64 mL/min    Comment: (NOTE) Calculated using the CKD-EPI Creatinine Equation (2021)    Anion gap 10 5 - 15    Comment: Performed at [facility], [address] [place], [place] [address]  Ethanol     Status: None   Collection Time: 01/08/23  7:42 PM  Result Value Ref Range   Alcohol, Ethyl (B) <10 <10 mg/dL    Comment: (NOTE) Lowest detectable limit for serum alcohol is 10 mg/dL.  For medical purposes only. Performed at [facility], [address] [place], [place] [address]   Salicylate level     Status: Abnormal   Collection Time: 01/08/23  7:42 PM  Result Value Ref Range   Salicylate Lvl <7.0 (L) 7.0 - 30.0 mg/dL    Comment: Performed at [facility], [address] [place], [place] [address]  Acetaminophen level     Status: Abnormal   Collection Time: 01/08/23  7:42 PM  Result Value Ref Range   Acetaminophen (Tylenol), Serum <10 (L) 10 - 30 ug/mL    Comment: (NOTE) Therapeutic concentrations vary significantly. A range of 10-30 ug/mL  may be an effective concentration for many patients. However, some  are best treated at concentrations outside of this range. Acetaminophen concentrations >150 ug/mL at 4 hours after ingestion  and >50 ug/mL at 12 hours after ingestion are often associated with  toxic reactions.  Performed at [facility], [address] [place], [place] [address]   cbc     Status: Abnormal    Collection Time: 01/08/23  7:42 PM  Result Value Ref Range   WBC 17.1 (H) 4.0 - 10.5 K/uL   RBC 5.48 4.22 - 5.81 MIL/uL   Hemoglobin 16.5 13.0 - 17.0 g/dL   HCT 95.1 88.4 - 16.6 %   MCV 89.1 80.0 - 100.0 fL   MCH 30.1 26.0 - 34.0 pg   MCHC 33.8 30.0 - 36.0 g/dL   RDW 06.3 01.6 - 01.0 %   Platelets 421 (H) 150 - 400 K/uL   nRBC 0.0 0.0 - 0.2 %    Comment: Performed at [facility], [address] [place], [place] [address]  Urine Drug Screen, Qualitative     Status: Abnormal   Collection Time: 01/08/23 10:08 PM  Result Value Ref Range   Tricyclic, Ur Screen POSITIVE (A) NONE DETECTED   Amphetamines, Ur Screen NONE DETECTED NONE DETECTED   MDMA (Ecstasy)Ur Screen NONE DETECTED NONE DETECTED   Cocaine Metabolite,Ur [place] NONE DETECTED NONE DETECTED   Opiate, Ur Screen NONE DETECTED NONE DETECTED   Phencyclidine (PCP) Ur S NONE DETECTED NONE DETECTED   Cannabinoid 50 Ng, Ur [place] NONE DETECTED NONE DETECTED   Barbiturates, Ur Screen NONE DETECTED NONE DETECTED   Benzodiazepine, Ur Scrn POSITIVE (A) NONE DETECTED   Methadone Scn, Ur NONE DETECTED NONE DETECTED    Comment: (NOTE) Tricyclics + metabolites, urine  Cutoff 1000 ng/mL Amphetamines + metabolites, urine  Cutoff 1000 ng/mL MDMA (Ecstasy), urine              Cutoff 500 ng/mL Cocaine Metabolite, urine          Cutoff 300 ng/mL Opiate + metabolites, urine        Cutoff 300 ng/mL Phencyclidine (PCP), urine         Cutoff 25 ng/mL Cannabinoid, urine                 Cutoff 50 ng/mL Barbiturates + metabolites, urine  Cutoff 200 ng/mL Benzodiazepine, urine              Cutoff 200 ng/mL Methadone, urine                   Cutoff 300 ng/mL  The urine drug screen provides only a preliminary, unconfirmed analytical test result and should not be used for non-medical purposes. Clinical consideration and professional judgment should be applied to any positive drug screen result due to possible interfering substances. A more  specific alternate chemical method must be used in order to obtain a confirmed analytical result. Gas chromatography / mass spectrometry (GC/MS) is the preferred confirm atory method. Performed at [facility], [address] [place], [place] [address]     Blood Alcohol level:  Lab Results  Component Value Date   [facility] <10 01/08/2023   ETH <10 12/26/2022    Metabolic Disorder Labs: Lab Results  Component Value Date   HGBA1C 5.2 12/30/2022   MPG 102.54 12/30/2022   No results found for: "PROLACTIN" Lab Results  Component Value Date   CHOL 145 12/30/2022   TRIG 249 (H) 12/30/2022   HDL 27 (L) 12/30/2022   CHOLHDL 5.4 12/30/2022   VLDL 50 (H) 12/30/2022   LDLCALC 68 12/30/2022    Physical Findings: AIMS:  , ,  ,  ,    CIWA:    COWS:     Musculoskeletal: Strength & Muscle Tone: within normal limits Gait & Station: normal Patient leans: N/A  Psychiatric Specialty Exam: Physical Exam Vitals and nursing note reviewed.  Constitutional:      Appearance: Normal appearance.  HENT:     Head: Normocephalic.     Nose: Nose normal.  Pulmonary:     Effort: Pulmonary effort is normal.  Musculoskeletal:        General: Normal range of motion.     Cervical back: Normal range of motion.  Neurological:     General: No focal deficit present.     Mental Status: He is alert and oriented to person, place, and time.     Review of Systems  Psychiatric/Behavioral:  Positive for depression. The patient is nervous/anxious.   All other systems reviewed and are negative.   Blood pressure (!) 148/102, pulse 77, temperature 97.7 F (36.5 C), resp. rate (!) 24, height 5\' 7"  (1.702 m), weight 90.7 kg, SpO2 97%.Body mass index is 31.32 kg/m.  General Appearance: Casual  Eye Contact:  Good  Speech:  Normal Rate  Volume:  Normal  Mood:  Anxious and Depressed  Affect:  Congruent  Thought Process:  Coherent  Orientation:  Full (Time, Place, and Person)  Thought Content:   WDL and Logical  Suicidal Thoughts:  No  Homicidal Thoughts:  at times, no intent or plan  Memory:  Immediate;   Fair Recent;   Fair Remote;   Fair  Judgement:  Fair  Insight:  Fair  Psychomotor Activity:  Normal  Concentration:  Concentration: Fair and Attention Span: Fair  Recall:  [employer] of Knowledge:  Fair  Language:  Good  Akathisia:  No  Handed:  Right  AIMS (if indicated):     Assets:  Housing Leisure Time Physical Health Resilience Social Support  ADL's:  Intact  Cognition:  WNL  Sleep:         Physical Exam: Physical Exam Vitals and nursing note reviewed.  Constitutional:      Appearance: Normal appearance.  HENT:     Head: Normocephalic.     Nose: Nose normal.  Pulmonary:     Effort: Pulmonary effort is normal.  Musculoskeletal:        General: Normal range of motion.     Cervical back: Normal range of motion.  Neurological:     General: No focal deficit present.     Mental Status: He is alert and oriented to person, place, and time.    Review of Systems  Psychiatric/Behavioral:  Positive for depression. The patient is nervous/anxious.   All other systems reviewed and are negative.  Blood pressure 125/81, pulse 70, temperature (!) 97.5 F (36.4 C), resp. rate 17, height 5\' 7"  (1.702 m), weight 90.7 kg, SpO2 94%. Body mass index is 31.32 kg/m.   Treatment Plan Summary: Daily contact with patient to assess and evaluate symptoms and progress in treatment, Medication management, and Plan : Major depressive disorder, recurrent, severe without psychosis: Luvox 50 mg BID   General anxiety disorder and obsessive thinking: Luvox 50 mg BID Gabapentin 300 mg TID  Propranolol 10 mg daily increased to BID Zyprexa 5 mg at bedtime increased to 7.5 mg daily  Nanine Means, NP 01/10/2023, 5:54 AM

## 2023-01-11 DIAGNOSIS — F332 Major depressive disorder, recurrent severe without psychotic features: Secondary | ICD-10-CM | POA: Diagnosis not present

## 2023-01-11 MED ORDER — FLUVOXAMINE MALEATE 50 MG PO TABS
50.0000 mg | ORAL_TABLET | Freq: Every day | ORAL | Status: DC
  Administered 2023-01-12 – 2023-01-20 (×9): 50 mg via ORAL
  Filled 2023-01-11 (×9): qty 1

## 2023-01-11 MED ORDER — FLUVOXAMINE MALEATE 50 MG PO TABS
100.0000 mg | ORAL_TABLET | Freq: Every day | ORAL | Status: DC
  Administered 2023-01-11 – 2023-01-21 (×11): 100 mg via ORAL
  Filled 2023-01-11 (×11): qty 2

## 2023-01-11 NOTE — Progress Notes (Signed)
Assumed care of patient this am, he present A&O x 3, affect & mood sad, depressed and anxious.. Pt isolative, to his room with the exception for meal times. Pt reports feeling out of control mentally, with racing thoughts, behavior appropriate to situation and was with complaints of anxiety. Pt denied thoughts, plan or intent to harm self or others and made a safety commitment.  Pt is compliant with taking medications and treatment goal.  Pt educated on plan of care, medication regimen and support given, he acknowledged an understanding and was without further complaints or concerns. Pt is maintained on q 15 min rounds for safety and support.

## 2023-01-11 NOTE — Plan of Care (Signed)

## 2023-01-11 NOTE — Group Note (Signed)
Recreation Therapy Group Note   Group Topic:Leisure Education  Group Date: 01/11/2023 Start Time: 1000 End Time: 1100 Facilitators: Rosina Lowenstein, LRT, CTRS Location:  Craft Room  Group Description: Leisure. Patients were given the option to choose from singing karaoke, coloring mandalas, using oil pastels, journaling, or playing with play-doh. LRT and pts discussed the meaning of leisure, the importance of participating in leisure during their free time/when they're outside of the hospital, as well as how our leisure interests can also serve as coping skills.   Goal Area(s) Addressed:  Patient will identify a current leisure interest.  Patient will learn the definition of "leisure". Patient will practice making a positive decision. Patient will have the opportunity to try a new leisure activity. Patient will communicate with peers and LRT.    Affect/Mood: N/A   Participation Level: Did not attend    Clinical Observations/Individualized Feedback: Patient did not attend group.   Plan: Continue to engage patient in RT group sessions 2-3x/week.   Rosina Lowenstein, LRT, CTRS 01/11/2023 12:13 PM

## 2023-01-11 NOTE — Group Note (Signed)
Recreation Therapy Group Note   Group Topic:Health and Wellness  Group Date: 01/11/2023 Start Time: 1530 End Time: 1615 Facilitators: Rosina Lowenstein, LRT, CTRS Location:  Dayroom  Group Description: Seated Exercise. LRT discussed the mental and physical benefits of exercise. LRT and group discussed how physical activity can be used as a coping skill. Pt's and LRT followed along to an exercise video on the TV screen that provided a visual representation and audio description of every exercise performed. Pt's encouraged to listen to their bodies and stop at any time if they experience feelings of discomfort or pain. Pts were encouraged to drink water and stay hydrated.   Goal Area(s) Addressed: Patient will learn benefits of physical activity. Patient will identify exercise as a coping skill.  Patient will follow multistep directions. Patient will try a new leisure interest.    Affect/Mood: N/A   Participation Level: Did not attend    Clinical Observations/Individualized Feedback: Patient did not attend group.   Plan: Continue to engage patient in RT group sessions 2-3x/week.   Rosina Lowenstein, LRT, CTRS 01/11/2023 5:28 PM

## 2023-01-11 NOTE — Progress Notes (Signed)
[facility] MD Progress Note  01/11/2023 7:00 AM Ronald [name].  MRN:  [id number]  Subjective:   The patient was seen and evaluated and the chart was reviewed.  The case was explored with treatment team.  Staff reports the patient has been isolating himself.  He received hydroxyzine and Benadryl as needed.  He is compliant with his regular medications.  Notes, labs and vital signs were reviewed.  When seen today he was lying in bed and was quite withdrawn.  He reports that he is not doing well.  He continues to endorse depression at a 10/10 anxiety at 8/10 and denies any suicidal ideations but endorses ongoing racing thoughts and homicidal ideations.  He states that he is laying in bed because he is fearful he might hurt someone.  Although historically has never hurt anyone he has this constant ruminating thoughts that he may harm somebody.  He states that his sleep is poor his appetite is poor but he has no side effects on medications.  Although patient denies any active SI/HI/AVH he reports that he has this compulsion and obsessional wanted to hurt someone.  These ruminating thoughts are quite concerning for him and he reports increasing depression.    Principal Problem: Major depressive disorder, recurrent severe without psychotic features (HCC) Diagnosis:   Total Time spent with patient: 20 minutes  Past Psychiatric History: Generalized anxiety disorder, Major depressive disorder  Past Medical History:  Past Medical History:  Diagnosis Date   Anxiety    Depression    Family history of adverse reaction to anesthesia    difficult to wake up - Mother   GERD (gastroesophageal reflux disease)    PTSD (post-traumatic stress disorder)     Past Surgical History:  Procedure Laterality Date   ELECTROCONVULSIVE THERAPY     FRACTURE SURGERY     wrist left   INSERTION OF MESH  08/25/2021   Procedure: INSERTION OF MESH;  Surgeon: Sung Amabile, DO;  Location: [facility] ORS;  Service: General;;    UMBILICAL HERNIA REPAIR  08/25/2021   Procedure: HERNIA REPAIR UMBILICAL ADULT;  Surgeon: Sung Amabile, DO;  Location: [facility] ORS;  Service: General;;   Family History: History reviewed. No pertinent family history. Family Psychiatric  History: family history of dementia Social History:  Social History   Substance and Sexual Activity  Alcohol Use Yes   Comment: occasional     Social History   Substance and Sexual Activity  Drug Use No    Social History   Socioeconomic History   Marital status: Single    Spouse name: Not on file   Number of children: Not on file   Years of education: Not on file   Highest education level: Not on file  Occupational History   Not on file  Tobacco Use   Smoking status: Every Day    Current packs/day: 1.00    Average packs/day: 1 pack/day for 24.9 years (24.9 ttl pk-yrs)    Types: Cigarettes    Start date: 2000   Smokeless tobacco: Never  Vaping Use   Vaping status: Every Day  Substance and Sexual Activity   Alcohol use: Yes    Comment: occasional   Drug use: No   Sexual activity: Not on file  Other Topics Concern   Not on file  Social History Narrative   Not on file   Social Determinants of Health   Financial Resource Strain: Medium Risk (12/10/2022)   Received from [facility]   Overall  Financial Resource Strain (CARDIA)    Difficulty of Paying Living Expenses: Somewhat hard  Food Insecurity: No Food Insecurity (01/09/2023)   Hunger Vital Sign    Worried About Running Out of Food in the Last Year: Never true    Ran Out of Food in the Last Year: Never true  Transportation Needs: No Transportation Needs (01/09/2023)   PRAPARE - Administrator, Civil Service (Medical): No    Lack of Transportation (Non-Medical): No  Physical Activity: Not on file  Stress: Not on file  Social Connections: Not on file   Additional Social History:                         Sleep: Fair  Appetite:  Fair  Current  Medications: Current Facility-Administered Medications  Medication Dose Route Frequency Provider Last Rate Last Admin   acetaminophen (TYLENOL) tablet 650 mg  650 mg Oral Q6H PRN Dixon, Rashaun M, NP       alum & mag hydroxide-simeth (MAALOX/MYLANTA) 200-200-20 MG/5ML suspension 30 mL  30 mL Oral Q4H PRN Dixon, Rashaun M, NP       haloperidol (HALDOL) tablet 5 mg  5 mg Oral TID PRN Jearld Lesch, NP   5 mg at 01/09/23 4540   And   diphenhydrAMINE (BENADRYL) capsule 50 mg  50 mg Oral TID PRN Jearld Lesch, NP   50 mg at 01/10/23 2058   haloperidol lactate (HALDOL) injection 5 mg  5 mg Intramuscular TID PRN Jearld Lesch, NP       And   diphenhydrAMINE (BENADRYL) injection 50 mg  50 mg Intramuscular TID PRN Jearld Lesch, NP       And   LORazepam (ATIVAN) injection 2 mg  2 mg Intramuscular TID PRN Jearld Lesch, NP       haloperidol lactate (HALDOL) injection 10 mg  10 mg Intramuscular TID PRN Jearld Lesch, NP       And   diphenhydrAMINE (BENADRYL) injection 50 mg  50 mg Intramuscular TID PRN Jearld Lesch, NP       And   LORazepam (ATIVAN) injection 2 mg  2 mg Intramuscular TID PRN Durwin Nora, Rashaun M, NP       fluvoxaMINE (LUVOX) tablet 50 mg  50 mg Oral BID Charm Rings, NP   50 mg at 01/10/23 1709   gabapentin (NEURONTIN) capsule 300 mg  300 mg Oral TID Jearld Lesch, NP   300 mg at 01/10/23 1710   hydrOXYzine (ATARAX) tablet 50 mg  50 mg Oral TID PRN Jearld Lesch, NP   50 mg at 01/10/23 2058   magnesium hydroxide (MILK OF MAGNESIA) suspension 30 mL  30 mL Oral Daily PRN Jearld Lesch, NP       nicotine polacrilex (NICORETTE) gum 2 mg  2 mg Oral PRN Charm Rings, NP   2 mg at 01/10/23 1709   OLANZapine (ZYPREXA) tablet 7.5 mg  7.5 mg Oral QHS Dixon, Rashaun M, NP   7.5 mg at 01/10/23 2058   propranolol (INDERAL) tablet 10 mg  10 mg Oral BID Charm Rings, NP   10 mg at 01/10/23 1709    Lab Results:  No results found for this or any previous visit  (from the past 48 hour(s)).   Blood Alcohol level:  Lab Results  Component Value Date   ETH <10 01/08/2023   ETH <10 12/26/2022    Metabolic  Disorder Labs: Lab Results  Component Value Date   HGBA1C 5.2 12/30/2022   MPG 102.54 12/30/2022   No results found for: "PROLACTIN" Lab Results  Component Value Date   CHOL 145 12/30/2022   TRIG 249 (H) 12/30/2022   HDL 27 (L) 12/30/2022   CHOLHDL 5.4 12/30/2022   VLDL 50 (H) 12/30/2022   LDLCALC 68 12/30/2022    Physical Findings: AIMS:  , ,  ,  ,    CIWA:    COWS:     Musculoskeletal: Strength & Muscle Tone: within normal limits Gait & Station: normal Patient leans: N/A  Psychiatric Specialty Exam: Physical Exam Vitals and nursing note reviewed.  Constitutional:      Appearance: Normal appearance.  HENT:     Head: Normocephalic.     Nose: Nose normal.  Pulmonary:     Effort: Pulmonary effort is normal.  Musculoskeletal:        General: Normal range of motion.     Cervical back: Normal range of motion.  Neurological:     General: No focal deficit present.     Mental Status: He is alert and oriented to person, place, and time.     Review of Systems  Psychiatric/Behavioral:  Positive for depression. The patient is nervous/anxious.   All other systems reviewed and are negative.   Blood pressure (!) 137/93, pulse (!) 56, temperature 97.7 F (36.5 C), resp. rate 20, height 5\' 7"  (1.702 m), weight 90.7 kg, SpO2 98%.Body mass index is 31.32 kg/m.  General Appearance: Casual  Eye Contact:  Fair  Speech:  Clear and Coherent and Normal Rate  Volume:  Normal  Mood:  Anxious, Depressed, and Dysphoric  Affect:  Congruent and Restricted  Thought Process:  Coherent and Linear  Orientation:  Full (Time, Place, and Person)  Thought Content:  WDL, Logical, Obsessions, and Rumination  Suicidal Thoughts:  Yes.  without intent/plan  Homicidal Thoughts:  at times, no intent or plan  Memory:  Immediate;   Fair Recent;    Fair Remote;   Fair  Judgement:  Fair  Insight:  Fair  Psychomotor Activity:  Normal  Concentration:  Concentration: Fair and Attention Span: Fair  Recall:  [employer] of Knowledge:  Fair  Language:  Good  Akathisia:  No  Handed:  Right  AIMS (if indicated):     Assets:  Housing Leisure Time Physical Health Resilience Social Support  ADL's:  Intact  Cognition:  WNL  Sleep:         Physical Exam: Physical Exam Vitals and nursing note reviewed.  Constitutional:      Appearance: Normal appearance.  HENT:     Head: Normocephalic.     Nose: Nose normal.  Pulmonary:     Effort: Pulmonary effort is normal.  Musculoskeletal:        General: Normal range of motion.     Cervical back: Normal range of motion.  Neurological:     General: No focal deficit present.     Mental Status: He is alert and oriented to person, place, and time.    Review of Systems  Psychiatric/Behavioral:  Positive for depression. The patient is nervous/anxious.   All other systems reviewed and are negative.  Blood pressure (!) 137/93, pulse (!) 56, temperature 97.7 F (36.5 C), resp. rate 20, height 5\' 7"  (1.702 m), weight 90.7 kg, SpO2 98%. Body mass index is 31.32 kg/m.   Treatment Plan Summary: Daily contact with patient to assess and evaluate symptoms  and progress in treatment, Medication management, and Plan : Major depressive disorder, recurrent, severe without psychosis: Luvox increased to 50 in the morning and 100 at bedtime   General anxiety disorder and obsessive thinking: Increase Luvox to 50 mg in the morning and 100 mg at bedtime Gabapentin 300 mg TID  Propranolol 10 mg daily increased to BID Zyprexa 5 mg at bedtime increased to 7.5 mg daily  Nanine Means, NP 01/11/2023, 7:00 AM

## 2023-01-11 NOTE — Plan of Care (Signed)
 Pt compliant with procedures on the unit, denies SI/HI/AVH  Problem: Education: Goal: Knowledge of [facility] Education information/materials will improve Outcome: Progressing Goal: Emotional status will improve Outcome: Progressing Goal: Mental status will improve Outcome: Progressing Goal: Verbalization of understanding the information provided will improve Outcome: Progressing   Problem: Activity: Goal: Interest or engagement in activities will improve Outcome: Progressing Goal: Sleeping patterns will improve Outcome: Progressing   Problem: Coping: Goal: Ability to verbalize frustrations and anger appropriately will improve Outcome: Progressing Goal: Ability to demonstrate self-control will improve Outcome: Progressing   Problem: Health Behavior/Discharge Planning: Goal: Identification of resources available to assist in meeting health care needs will improve Outcome: Progressing Goal: Compliance with treatment plan for underlying cause of condition will improve Outcome: Progressing   Problem: Physical Regulation: Goal: Ability to maintain clinical measurements within normal limits will improve Outcome: Progressing   Problem: Safety: Goal: Periods of time without injury will increase Outcome: Progressing

## 2023-01-11 NOTE — Group Note (Signed)
Date:  01/11/2023 Time:  6:23 PM  Group Topic/Focus:  Crisis Planning:   The purpose of this group is to help patients create a crisis plan for use upon discharge or in the future, as needed.    Participation Level:  Did Not Attend   Ronald [name] 01/11/2023, 6:23 PM

## 2023-01-12 DIAGNOSIS — F332 Major depressive disorder, recurrent severe without psychotic features: Secondary | ICD-10-CM | POA: Diagnosis not present

## 2023-01-12 NOTE — Plan of Care (Signed)
  Problem: Education: Goal: Emotional status will improve Outcome: Progressing   Problem: Education: Goal: Mental status will improve Outcome: Progressing   

## 2023-01-12 NOTE — Progress Notes (Signed)
[facility] MD Progress Note  01/12/2023 11:51 AM Ronald [name].  MRN:  [id number]  Subjective:   The patient was seen and evaluated and the chart was reviewed.Staff reports that the patient has been compliant with treatment.  However he still remains withdrawn and isolated to his room.  He is taking the medications.  He received Haldol 5 mg last night for mild agitation and confusion.  No other behavioral issues noted.   The patient was seen today he was alert oriented but lying in bed quite withdrawn and continues to endorse ongoing obsessional thoughts.  He does report that he slept better and that his depression is slightly improved.  Today he rates it at 9/10 and anxiety at a 7/10.  He denies any suicidal ideations and he denies any anger or frustration but has obsessional thoughts about hurting others that are quite bothersome to him.  Medication is helping with no side effects.  Luvox was increased yesterday to 150 mg a day. He is strongly encouraged to get up and attend groups and continue to work on behavioral therapy.  Plan is to continue current medications as prescribed.   Principal Problem: Major depressive disorder, recurrent severe without psychotic features (HCC) Diagnosis:   Total Time spent with patient: 20 minutes  Past Psychiatric History: Generalized anxiety disorder, Major depressive disorder  Past Medical History:  Past Medical History:  Diagnosis Date   Anxiety    Depression    Family history of adverse reaction to anesthesia    difficult to wake up - Mother   GERD (gastroesophageal reflux disease)    PTSD (post-traumatic stress disorder)     Past Surgical History:  Procedure Laterality Date   ELECTROCONVULSIVE THERAPY     FRACTURE SURGERY     wrist left   INSERTION OF MESH  08/25/2021   Procedure: INSERTION OF MESH;  Surgeon: Sung Amabile, DO;  Location: [facility] ORS;  Service: General;;   UMBILICAL HERNIA REPAIR  08/25/2021   Procedure: HERNIA REPAIR UMBILICAL  ADULT;  Surgeon: Sung Amabile, DO;  Location: [facility] ORS;  Service: General;;   Family History: History reviewed. No pertinent family history. Family Psychiatric  History: family history of dementia Social History:  Social History   Substance and Sexual Activity  Alcohol Use Yes   Comment: occasional     Social History   Substance and Sexual Activity  Drug Use No    Social History   Socioeconomic History   Marital status: Single    Spouse name: Not on file   Number of children: Not on file   Years of education: Not on file   Highest education level: Not on file  Occupational History   Not on file  Tobacco Use   Smoking status: Every Day    Current packs/day: 1.00    Average packs/day: 1 pack/day for 24.9 years (24.9 ttl pk-yrs)    Types: Cigarettes    Start date: 2000   Smokeless tobacco: Never  Vaping Use   Vaping status: Every Day  Substance and Sexual Activity   Alcohol use: Yes    Comment: occasional   Drug use: No   Sexual activity: Not on file  Other Topics Concern   Not on file  Social History Narrative   Not on file   Social Determinants of Health   Financial Resource Strain: Medium Risk (12/10/2022)   Received from [facility]   Overall Financial Resource Strain (CARDIA)    Difficulty of Paying Living Expenses:  Somewhat hard  Food Insecurity: No Food Insecurity (01/09/2023)   Hunger Vital Sign    Worried About Running Out of Food in the Last Year: Never true    Ran Out of Food in the Last Year: Never true  Transportation Needs: No Transportation Needs (01/09/2023)   PRAPARE - Administrator, Civil Service (Medical): No    Lack of Transportation (Non-Medical): No  Physical Activity: Not on file  Stress: Not on file  Social Connections: Not on file   Additional Social History:                         Sleep: Fair  Appetite:  Fair  Current Medications: Current Facility-Administered Medications  Medication Dose Route  Frequency Provider Last Rate Last Admin   acetaminophen (TYLENOL) tablet 650 mg  650 mg Oral Q6H PRN Jearld Lesch, NP       alum & mag hydroxide-simeth (MAALOX/MYLANTA) 200-200-20 MG/5ML suspension 30 mL  30 mL Oral Q4H PRN Dixon, Rashaun M, NP       haloperidol (HALDOL) tablet 5 mg  5 mg Oral TID PRN Jearld Lesch, NP   5 mg at 01/11/23 2039   And   diphenhydrAMINE (BENADRYL) capsule 50 mg  50 mg Oral TID PRN Jearld Lesch, NP   50 mg at 01/11/23 2039   haloperidol lactate (HALDOL) injection 5 mg  5 mg Intramuscular TID PRN Jearld Lesch, NP       And   diphenhydrAMINE (BENADRYL) injection 50 mg  50 mg Intramuscular TID PRN Jearld Lesch, NP       And   LORazepam (ATIVAN) injection 2 mg  2 mg Intramuscular TID PRN Jearld Lesch, NP       haloperidol lactate (HALDOL) injection 10 mg  10 mg Intramuscular TID PRN Jearld Lesch, NP       And   diphenhydrAMINE (BENADRYL) injection 50 mg  50 mg Intramuscular TID PRN Jearld Lesch, NP       And   LORazepam (ATIVAN) injection 2 mg  2 mg Intramuscular TID PRN Dixon, Rashaun M, NP       fluvoxaMINE (LUVOX) tablet 100 mg  100 mg Oral QHS Rex Kras, MD   100 mg at 01/11/23 2114   fluvoxaMINE (LUVOX) tablet 50 mg  50 mg Oral Daily Rex Kras, MD   50 mg at 01/12/23 0849   gabapentin (NEURONTIN) capsule 300 mg  300 mg Oral TID Jearld Lesch, NP   300 mg at 01/12/23 0850   hydrOXYzine (ATARAX) tablet 50 mg  50 mg Oral TID PRN Jearld Lesch, NP   50 mg at 01/10/23 2058   magnesium hydroxide (MILK OF MAGNESIA) suspension 30 mL  30 mL Oral Daily PRN Jearld Lesch, NP       nicotine polacrilex (NICORETTE) gum 2 mg  2 mg Oral PRN Charm Rings, NP   2 mg at 01/10/23 1709   OLANZapine (ZYPREXA) tablet 7.5 mg  7.5 mg Oral QHS Dixon, Rashaun M, NP   7.5 mg at 01/11/23 2112   propranolol (INDERAL) tablet 10 mg  10 mg Oral BID Charm Rings, NP   10 mg at 01/12/23 9147    Lab Results:  No results found for this  or any previous visit (from the past 48 hour(s)).   Blood Alcohol level:  Lab Results  Component Value Date   ETH <10 01/08/2023  ETH <10 12/26/2022    Metabolic Disorder Labs: Lab Results  Component Value Date   HGBA1C 5.2 12/30/2022   MPG 102.54 12/30/2022   No results found for: "PROLACTIN" Lab Results  Component Value Date   CHOL 145 12/30/2022   TRIG 249 (H) 12/30/2022   HDL 27 (L) 12/30/2022   CHOLHDL 5.4 12/30/2022   VLDL 50 (H) 12/30/2022   LDLCALC 68 12/30/2022    Physical Findings: AIMS:  , ,  ,  ,    CIWA:    COWS:     Musculoskeletal: Strength & Muscle Tone: within normal limits Gait & Station: normal Patient leans: N/A  Psychiatric Specialty Exam: Physical Exam Vitals and nursing note reviewed.  Constitutional:      Appearance: Normal appearance.  HENT:     Head: Normocephalic.     Nose: Nose normal.  Pulmonary:     Effort: Pulmonary effort is normal.  Musculoskeletal:        General: Normal range of motion.     Cervical back: Normal range of motion.  Neurological:     General: No focal deficit present.     Mental Status: He is alert and oriented to person, place, and time.     Review of Systems  Psychiatric/Behavioral:  Positive for depression. The patient is nervous/anxious.   All other systems reviewed and are negative.   Blood pressure (!) 152/115, pulse 67, temperature (!) 97.2 F (36.2 C), resp. rate 18, height 5\' 7"  (1.702 m), weight 90.7 kg, SpO2 98%.Body mass index is 31.32 kg/m.  General Appearance: Casual  Eye Contact:  Fair  Speech:  Clear and Coherent and Normal Rate  Volume:  Normal  Mood:  Anxious, Depressed, and Dysphoric  Affect:  Congruent and Restricted  Thought Process:  Coherent and Linear  Orientation:  Full (Time, Place, and Person)  Thought Content:  WDL, Logical, Obsessions, and Rumination  Suicidal Thoughts:  Yes.  without intent/plan  Homicidal Thoughts:  at times, no intent or plan  Memory:  Immediate;    Fair Recent;   Fair Remote;   Fair  Judgement:  Fair  Insight:  Fair  Psychomotor Activity:  Normal  Concentration:  Concentration: Fair and Attention Span: Fair  Recall:  [employer] of Knowledge:  Fair  Language:  Good  Akathisia:  No  Handed:  Right  AIMS (if indicated):     Assets:  Housing Leisure Time Physical Health Resilience Social Support  ADL's:  Intact  Cognition:  WNL  Sleep:         Physical Exam: Physical Exam Vitals and nursing note reviewed.  Constitutional:      Appearance: Normal appearance.  HENT:     Head: Normocephalic.     Nose: Nose normal.  Pulmonary:     Effort: Pulmonary effort is normal.  Musculoskeletal:        General: Normal range of motion.     Cervical back: Normal range of motion.  Neurological:     General: No focal deficit present.     Mental Status: He is alert and oriented to person, place, and time.    Review of Systems  Psychiatric/Behavioral:  Positive for depression. The patient is nervous/anxious.   All other systems reviewed and are negative.  Blood pressure (!) 152/115, pulse 67, temperature (!) 97.2 F (36.2 C), resp. rate 18, height 5\' 7"  (1.702 m), weight 90.7 kg, SpO2 98%. Body mass index is 31.32 kg/m.   Treatment Plan Summary: Daily contact  with patient to assess and evaluate symptoms and progress in treatment, Medication management, and Plan : Major depressive disorder, recurrent, severe without psychosis: Luvox increased to 50 in the morning and 100 at bedtime   General anxiety disorder and obsessive thinking: Continue Luvox to 50 mg in the morning and 100 mg at bedtime Gabapentin 300 mg TID  Propranolol 10 mg daily increased to BID Zyprexa 5 mg at bedtime increased to 7.5 mg daily  Rex Kras, MD 01/12/2023, 11:51 AM Patient ID: Ronald [name]., male   DOB: 07-10-1975, 47 y.o.   MRN: [id number]

## 2023-01-12 NOTE — Group Note (Signed)
Date:  01/12/2023 Time:  11:20 AM  Group Topic/Focus:  Emotional Education:   The focus of this group is to discuss what feelings/emotions are, and how they are experienced.    Participation Level:  Active  Participation Quality:  Appropriate  Affect:  Appropriate  Cognitive:  Appropriate  Insight: Appropriate and Good  Engagement in Group:  Engaged  Modes of Intervention:  Activity  Additional Comments:    Jhordyn Hoopingarner 01/12/2023, 11:20 AM

## 2023-01-12 NOTE — Progress Notes (Signed)
D- Patient alert and oriented. Affect/mood. Denies SI/ HI/ AVH. Patient denies pain. Patient endorses depression and anxiety. Goal of the day. A- Scheduled medications administered to patient, per MD orders. Support and encouragement provided.  Routine safety checks conducted every 15 minutes without incident.  Patient informed to notify staff with problems or concerns and verbalizes understanding. R- No adverse drug reactions noted.  Patient compliant with medications and treatment plan. Patient receptive, calm cooperative and interacts well with others on the unit.  Patient contracts for safety and  remains safe on the unit at this time.

## 2023-01-13 DIAGNOSIS — F332 Major depressive disorder, recurrent severe without psychotic features: Secondary | ICD-10-CM | POA: Diagnosis not present

## 2023-01-13 MED ORDER — AMLODIPINE BESYLATE 5 MG PO TABS
5.0000 mg | ORAL_TABLET | Freq: Every day | ORAL | Status: DC
  Administered 2023-01-13 – 2023-01-22 (×10): 5 mg via ORAL
  Filled 2023-01-13 (×10): qty 1

## 2023-01-13 NOTE — Group Note (Signed)
Date:  01/13/2023 Time:  9:01 PM  Group Topic/Focus:  Wrap-Up Group:   The focus of this group is to help patients review their daily goal of treatment and discuss progress on daily workbooks.    Participation Level:  Did Not Attend  Participation Quality:   none  Affect:   none  Cognitive:   none  Insight: None  Engagement in Group:   none  Modes of Intervention:   none  Additional Comments:  none  [name] 01/13/2023, 9:01 PM

## 2023-01-13 NOTE — Group Note (Signed)
Date:  01/13/2023 Time:  11:26 AM  Group Topic/Focus:   Goals Group:   The focus of this group is to help patients establish daily goals to achieve during treatment and discuss how the patient can incorporate goal setting into their daily lives to aide in recovery. Managing Feelings:   The focus of this group is to identify what feelings patients have difficulty handling and develop a plan to handle them in a healthier way upon discharge.  Participation Level:  Did Not Attend  Shawneequa Baldridge A Clancy Mullarkey 01/13/2023, 11:26 AM

## 2023-01-13 NOTE — Progress Notes (Signed)
[facility] MD Progress Note  01/13/2023 11:30 AM Ronald [name].  MRN:  [id number]  Subjective:   Staff reports that the patient has been withdrawn and isolated himself to the room.  He did not attend the groups.  He remains anxious but denies any active suicidal ideations.  He claims that his head is in a fog and is concerned about his thoughts.  He is taking his medications as prescribed and received as needed hydroxyzine last night.  When the patient was seen today he was laying in bed as usual but easily arousable.  He maintained fair eye contact.His speech is coherent without any obvious looseness of associations or flight of ideas.  He continues to improve very slowly.  Today he rates his depression at is 7/10 anxiety at a 6/10 and denies any active suicidal ideations but reports that he has aggressive/homicidal thoughts not directed at anyone.  These are constant ruminating thoughts and he recognizes these as obsessional thoughts but it does bother him.  He is contracting for safety and is encouraged to get up and attend groups and to focus on cognitive behavioral therapy.  The plan is to titrate medications as tolerated.   Luvox 50 mg in the morning and 100 mg at night Gabapentin 300 mg 3 times a day Olanzapine to be increased to 10 mg at bedtime Propranolol 10 mg twice a day Begin amlodipine 5 mg a day for hypertension  Principal Problem: Major depressive disorder, recurrent severe without psychotic features (HCC) Diagnosis:   Total Time spent with patient: 20 minutes  Past Psychiatric History: Generalized anxiety disorder, Major depressive disorder  Past Medical History:  Past Medical History:  Diagnosis Date   Anxiety    Depression    Family history of adverse reaction to anesthesia    difficult to wake up - Mother   GERD (gastroesophageal reflux disease)    PTSD (post-traumatic stress disorder)     Past Surgical History:  Procedure Laterality Date   ELECTROCONVULSIVE THERAPY      FRACTURE SURGERY     wrist left   INSERTION OF MESH  08/25/2021   Procedure: INSERTION OF MESH;  Surgeon: Sung Amabile, DO;  Location: [facility] ORS;  Service: General;;   UMBILICAL HERNIA REPAIR  08/25/2021   Procedure: HERNIA REPAIR UMBILICAL ADULT;  Surgeon: Sung Amabile, DO;  Location: [facility] ORS;  Service: General;;   Family History: History reviewed. No pertinent family history. Family Psychiatric  History: family history of dementia Social History:  Social History   Substance and Sexual Activity  Alcohol Use Yes   Comment: occasional     Social History   Substance and Sexual Activity  Drug Use No    Social History   Socioeconomic History   Marital status: Single    Spouse name: Not on file   Number of children: Not on file   Years of education: Not on file   Highest education level: Not on file  Occupational History   Not on file  Tobacco Use   Smoking status: Every Day    Current packs/day: 1.00    Average packs/day: 1 pack/day for 24.9 years (24.9 ttl pk-yrs)    Types: Cigarettes    Start date: 2000   Smokeless tobacco: Never  Vaping Use   Vaping status: Every Day  Substance and Sexual Activity   Alcohol use: Yes    Comment: occasional   Drug use: No   Sexual activity: Not on file  Other Topics Concern  Not on file  Social History Narrative   Not on file   Social Determinants of Health   Financial Resource Strain: Medium Risk (12/10/2022)   Received from [facility]   Overall Financial Resource Strain (CARDIA)    Difficulty of Paying Living Expenses: Somewhat hard  Food Insecurity: No Food Insecurity (01/09/2023)   Hunger Vital Sign    Worried About Running Out of Food in the Last Year: Never true    Ran Out of Food in the Last Year: Never true  Transportation Needs: No Transportation Needs (01/09/2023)   PRAPARE - Administrator, Civil Service (Medical): No    Lack of Transportation (Non-Medical): No  Physical Activity: Not on file   Stress: Not on file  Social Connections: Not on file   Additional Social History:                         Sleep: Fair  Appetite:  Fair  Current Medications: Current Facility-Administered Medications  Medication Dose Route Frequency Provider Last Rate Last Admin   acetaminophen (TYLENOL) tablet 650 mg  650 mg Oral Q6H PRN Dixon, Rashaun M, NP       alum & mag hydroxide-simeth (MAALOX/MYLANTA) 200-200-20 MG/5ML suspension 30 mL  30 mL Oral Q4H PRN Dixon, Rashaun M, NP       amLODipine (NORVASC) tablet 5 mg  5 mg Oral Daily Rex Kras, MD       haloperidol (HALDOL) tablet 5 mg  5 mg Oral TID PRN Jearld Lesch, NP   5 mg at 01/11/23 2039   And   diphenhydrAMINE (BENADRYL) capsule 50 mg  50 mg Oral TID PRN Jearld Lesch, NP   50 mg at 01/11/23 2039   haloperidol lactate (HALDOL) injection 5 mg  5 mg Intramuscular TID PRN Jearld Lesch, NP       And   diphenhydrAMINE (BENADRYL) injection 50 mg  50 mg Intramuscular TID PRN Jearld Lesch, NP       And   LORazepam (ATIVAN) injection 2 mg  2 mg Intramuscular TID PRN Jearld Lesch, NP       haloperidol lactate (HALDOL) injection 10 mg  10 mg Intramuscular TID PRN Jearld Lesch, NP       And   diphenhydrAMINE (BENADRYL) injection 50 mg  50 mg Intramuscular TID PRN Jearld Lesch, NP       And   LORazepam (ATIVAN) injection 2 mg  2 mg Intramuscular TID PRN Dixon, Rashaun M, NP       fluvoxaMINE (LUVOX) tablet 100 mg  100 mg Oral QHS Rex Kras, MD   100 mg at 01/12/23 2125   fluvoxaMINE (LUVOX) tablet 50 mg  50 mg Oral Daily Rex Kras, MD   50 mg at 01/13/23 0911   gabapentin (NEURONTIN) capsule 300 mg  300 mg Oral TID Jearld Lesch, NP   300 mg at 01/13/23 0910   hydrOXYzine (ATARAX) tablet 50 mg  50 mg Oral TID PRN Jearld Lesch, NP   50 mg at 01/12/23 2125   magnesium hydroxide (MILK OF MAGNESIA) suspension 30 mL  30 mL Oral Daily PRN Jearld Lesch, NP       nicotine polacrilex  (NICORETTE) gum 2 mg  2 mg Oral PRN Charm Rings, NP   2 mg at 01/10/23 1709   OLANZapine (ZYPREXA) tablet 7.5 mg  7.5 mg Oral QHS Jearld Lesch, NP  7.5 mg at 01/12/23 2125   propranolol (INDERAL) tablet 10 mg  10 mg Oral BID Charm Rings, NP   10 mg at 01/13/23 7425    Lab Results:  No results found for this or any previous visit (from the past 48 hour(s)).   Blood Alcohol level:  Lab Results  Component Value Date   ETH <10 01/08/2023   ETH <10 12/26/2022    Metabolic Disorder Labs: Lab Results  Component Value Date   HGBA1C 5.2 12/30/2022   MPG 102.54 12/30/2022   No results found for: "PROLACTIN" Lab Results  Component Value Date   CHOL 145 12/30/2022   TRIG 249 (H) 12/30/2022   HDL 27 (L) 12/30/2022   CHOLHDL 5.4 12/30/2022   VLDL 50 (H) 12/30/2022   LDLCALC 68 12/30/2022    Physical Findings: AIMS:  , ,  ,  ,    CIWA:    COWS:     Musculoskeletal: Strength & Muscle Tone: within normal limits Gait & Station: normal Patient leans: N/A  Psychiatric Specialty Exam: Physical Exam Vitals and nursing note reviewed.  Constitutional:      Appearance: Normal appearance.  HENT:     Head: Normocephalic.     Nose: Nose normal.  Pulmonary:     Effort: Pulmonary effort is normal.  Musculoskeletal:        General: Normal range of motion.     Cervical back: Normal range of motion.  Neurological:     General: No focal deficit present.     Mental Status: He is alert and oriented to person, place, and time.     Review of Systems  Psychiatric/Behavioral:  Positive for depression. The patient is nervous/anxious.   All other systems reviewed and are negative.   Blood pressure (!) 154/107, pulse 64, temperature (!) 97.3 F (36.3 C), resp. rate 20, height 5\' 7"  (1.702 m), weight 90.7 kg, SpO2 99%.Body mass index is 31.32 kg/m.  General Appearance: Casual  Eye Contact:  Fair  Speech:  Clear and Coherent and Normal Rate  Volume:  Normal  Mood:  Anxious,  Depressed, and Dysphoric  Affect:  Congruent and Restricted  Thought Process:  Coherent and Linear  Orientation:  Full (Time, Place, and Person)  Thought Content:  WDL, Logical, Obsessions, and Rumination  Suicidal Thoughts:  Yes.  without intent/plan  Homicidal Thoughts:  at times, no intent or plan  Memory:  Immediate;   Fair Recent;   Fair Remote;   Fair  Judgement:  Fair  Insight:  Fair  Psychomotor Activity:  Normal  Concentration:  Concentration: Fair and Attention Span: Fair  Recall:  [employer] of Knowledge:  Fair  Language:  Good  Akathisia:  No  Handed:  Right  AIMS (if indicated):     Assets:  Housing Leisure Time Physical Health Resilience Social Support  ADL's:  Intact  Cognition:  WNL  Sleep:         Physical Exam: Physical Exam Vitals and nursing note reviewed.  Constitutional:      Appearance: Normal appearance.  HENT:     Head: Normocephalic.     Nose: Nose normal.  Pulmonary:     Effort: Pulmonary effort is normal.  Musculoskeletal:        General: Normal range of motion.     Cervical back: Normal range of motion.  Neurological:     General: No focal deficit present.     Mental Status: He is alert and oriented to person,  place, and time.    Review of Systems  Psychiatric/Behavioral:  Positive for depression. The patient is nervous/anxious.   All other systems reviewed and are negative.  Blood pressure (!) 154/107, pulse 64, temperature (!) 97.3 F (36.3 C), resp. rate 20, height 5\' 7"  (1.702 m), weight 90.7 kg, SpO2 99%. Body mass index is 31.32 kg/m.   Treatment Plan Summary: Daily contact with patient to assess and evaluate symptoms and progress in treatment, Medication management, and Plan : Major depressive disorder, recurrent, severe without psychosis: General anxiety disorder and obsessive thinking:  The plan is to titrate medications as tolerated.   Luvox 50 mg in the morning and 100 mg at night Gabapentin 300 mg 3 times a  day Olanzapine to be increased to 10 mg at bedtime Propranolol 10 mg twice a day Begin amlodipine 5 mg a day for hypertension  Rex Kras, MD 01/13/2023, 11:30 AM Patient ID: Ronald [name]., male   DOB: 1975-07-23, 47 y.o.   MRN: [id number] Patient ID: Ronald [name]., male   DOB: [birth year], 47 y.o.   MRN: [id number]

## 2023-01-13 NOTE — Plan of Care (Signed)
  Problem: Education: Goal: Mental status will improve Outcome: Progressing   Problem: Health Behavior/Discharge Planning: Goal: Compliance with treatment plan for underlying cause of condition will improve Outcome: Progressing

## 2023-01-13 NOTE — Progress Notes (Signed)
   01/13/23 0900  Psych Admission Type (Psych Patients Only)  Admission Status Voluntary  Psychosocial Assessment  Patient Complaints Anxiety  Eye Contact Brief  Facial Expression Blank;Anxious  Affect Anxious  Speech Logical/coherent  Interaction Assertive  Motor Activity Slow  Appearance/Hygiene Unremarkable  Behavior Characteristics Cooperative;Anxious  Mood Despair;Anxious;Depressed  Thought Process  Coherency WDL  Content Blaming self  Delusions None reported or observed  Perception WDL  Hallucination None reported or observed  Judgment Impaired  Confusion WDL  Danger to Self  Current suicidal ideation? Denies  Self-Injurious Behavior No self-injurious ideation or behavior indicators observed or expressed   Agreement Not to Harm Self Yes  Description of Agreement verbal  Danger to Others  Danger to Others None reported or observed  Danger to Others Abnormal  Harmful Behavior to others No threats or harm toward other people

## 2023-01-13 NOTE — Group Note (Signed)
Date:  01/13/2023 Time:  6:57 PM  Group Topic/Focus:   Structured Activity Group  Participation Level:  Did Not Attend  Stetson Pelaez A Evanny Ellerbe 01/13/2023, 6:57 PM

## 2023-01-14 NOTE — BH IP Treatment Plan (Signed)
Interdisciplinary Treatment and Diagnostic Plan Update  01/14/2023 Time of Session: 9;00AM Ronald [name]. MRN: [id number]  Principal Diagnosis: Major depressive disorder, recurrent severe without psychotic features (HCC)  Secondary Diagnoses: Principal Problem:   Major depressive disorder, recurrent severe without psychotic features (HCC) Active Problems:   Obsessive thinking   Current Medications:  Current Facility-Administered Medications  Medication Dose Route Frequency Provider Last Rate Last Admin   acetaminophen (TYLENOL) tablet 650 mg  650 mg Oral Q6H PRN Dixon, Rashaun M, NP       alum & mag hydroxide-simeth (MAALOX/MYLANTA) 200-200-20 MG/5ML suspension 30 mL  30 mL Oral Q4H PRN Dixon, Rashaun M, NP       amLODipine (NORVASC) tablet 5 mg  5 mg Oral Daily Rex Kras, MD   5 mg at 01/14/23 0943   haloperidol (HALDOL) tablet 5 mg  5 mg Oral TID PRN Jearld Lesch, NP   5 mg at 01/11/23 2039   And   diphenhydrAMINE (BENADRYL) capsule 50 mg  50 mg Oral TID PRN Jearld Lesch, NP   50 mg at 01/11/23 2039   haloperidol lactate (HALDOL) injection 5 mg  5 mg Intramuscular TID PRN Jearld Lesch, NP       And   diphenhydrAMINE (BENADRYL) injection 50 mg  50 mg Intramuscular TID PRN Jearld Lesch, NP       And   LORazepam (ATIVAN) injection 2 mg  2 mg Intramuscular TID PRN Jearld Lesch, NP       haloperidol lactate (HALDOL) injection 10 mg  10 mg Intramuscular TID PRN Jearld Lesch, NP       And   diphenhydrAMINE (BENADRYL) injection 50 mg  50 mg Intramuscular TID PRN Jearld Lesch, NP       And   LORazepam (ATIVAN) injection 2 mg  2 mg Intramuscular TID PRN Dixon, Rashaun M, NP       fluvoxaMINE (LUVOX) tablet 100 mg  100 mg Oral QHS Rex Kras, MD   100 mg at 01/13/23 2110   fluvoxaMINE (LUVOX) tablet 50 mg  50 mg Oral Daily Rex Kras, MD   50 mg at 01/14/23 0943   gabapentin (NEURONTIN) capsule 300 mg  300 mg Oral TID Jearld Lesch, NP    300 mg at 01/14/23 5284   hydrOXYzine (ATARAX) tablet 50 mg  50 mg Oral TID PRN Jearld Lesch, NP   50 mg at 01/13/23 2109   magnesium hydroxide (MILK OF MAGNESIA) suspension 30 mL  30 mL Oral Daily PRN Jearld Lesch, NP       nicotine polacrilex (NICORETTE) gum 2 mg  2 mg Oral PRN Charm Rings, NP   2 mg at 01/13/23 1726   OLANZapine (ZYPREXA) tablet 7.5 mg  7.5 mg Oral QHS Dixon, Rashaun M, NP   7.5 mg at 01/13/23 2109   propranolol (INDERAL) tablet 10 mg  10 mg Oral BID Charm Rings, NP   10 mg at 01/14/23 1324   PTA Medications: Medications Prior to Admission  Medication Sig Dispense Refill Last Dose   escitalopram (LEXAPRO) 10 MG tablet Take 1 tablet (10 mg total) by mouth daily. 30 tablet 0    gabapentin (NEURONTIN) 300 MG capsule Take 1 capsule (300 mg total) by mouth 3 (three) times daily. 90 capsule 0    hydrOXYzine (ATARAX) 50 MG tablet Take 1 tablet (50 mg total) by mouth 3 (three) times daily as needed for anxiety. 30 tablet 0  Multiple Vitamin (MULTIVITAMIN WITH MINERALS) TABS tablet Take 1 tablet by mouth daily. 30 tablet 0    nicotine (NICODERM CQ - DOSED IN MG/24 HOURS) 21 mg/24hr patch Place 1 patch (21 mg total) onto the skin daily for 28 days. 28 patch 0    nicotine polacrilex (NICORETTE) 2 MG gum Take 1 each (2 mg total) by mouth as needed for smoking cessation. 100 tablet 0    OLANZapine (ZYPREXA) 5 MG tablet Take 1.5 tablets (7.5 mg total) by mouth at bedtime. 45 tablet 0    propranolol (INDERAL) 10 MG tablet Take 1 tablet (10 mg total) by mouth daily. 30 tablet 0    QUEtiapine (SEROQUEL) 200 MG tablet Take 1 tablet (200 mg total) by mouth at bedtime. 30 tablet 0    thiamine (VITAMIN B-1) 100 MG tablet Take 1 tablet (100 mg total) by mouth daily. 30 tablet 0    traZODone (DESYREL) 50 MG tablet Take 1 tablet (50 mg total) by mouth at bedtime as needed for sleep. 30 tablet 0     Patient Stressors: Marital or family conflict   Substance abuse    Patient  Strengths: Supportive family/friends   Treatment Modalities: Medication Management, Group therapy, Case management,  1 to 1 session with clinician, Psychoeducation, Recreational therapy.   Physician Treatment Plan for Primary Diagnosis: Major depressive disorder, recurrent severe without psychotic features (HCC) Long Term Goal(s): Improvement in symptoms so as ready for discharge   Short Term Goals: Ability to identify changes in lifestyle to reduce recurrence of condition will improve Ability to verbalize feelings will improve Ability to disclose and discuss suicidal ideas Ability to demonstrate self-control will improve Ability to identify and develop effective coping behaviors will improve Ability to maintain clinical measurements within normal limits will improve Compliance with prescribed medications will improve Ability to identify triggers associated with substance abuse/mental health issues will improve  Medication Management: Evaluate patient's response, side effects, and tolerance of medication regimen.  Therapeutic Interventions: 1 to 1 sessions, Unit Group sessions and Medication administration.  Evaluation of Outcomes: Not Progressing  Physician Treatment Plan for Secondary Diagnosis: Principal Problem:   Major depressive disorder, recurrent severe without psychotic features (HCC) Active Problems:   Obsessive thinking  Long Term Goal(s): Improvement in symptoms so as ready for discharge   Short Term Goals: Ability to identify changes in lifestyle to reduce recurrence of condition will improve Ability to verbalize feelings will improve Ability to disclose and discuss suicidal ideas Ability to demonstrate self-control will improve Ability to identify and develop effective coping behaviors will improve Ability to maintain clinical measurements within normal limits will improve Compliance with prescribed medications will improve Ability to identify triggers associated  with substance abuse/mental health issues will improve     Medication Management: Evaluate patient's response, side effects, and tolerance of medication regimen.  Therapeutic Interventions: 1 to 1 sessions, Unit Group sessions and Medication administration.  Evaluation of Outcomes: Not Progressing   RN Treatment Plan for Primary Diagnosis: Major depressive disorder, recurrent severe without psychotic features (HCC) Long Term Goal(s): Knowledge of disease and therapeutic regimen to maintain health will improve  Short Term Goals: Ability to demonstrate self-control, Ability to participate in decision making will improve, Ability to verbalize feelings will improve, Ability to disclose and discuss suicidal ideas, Ability to identify and develop effective coping behaviors will improve, and Compliance with prescribed medications will improve  Medication Management: RN will administer medications as ordered by provider, will assess and evaluate patient's response and  provide education to patient for prescribed medication. RN will report any adverse and/or side effects to prescribing provider.  Therapeutic Interventions: 1 on 1 counseling sessions, Psychoeducation, Medication administration, Evaluate responses to treatment, Monitor vital signs and CBGs as ordered, Perform/monitor CIWA, COWS, AIMS and Fall Risk screenings as ordered, Perform wound care treatments as ordered.  Evaluation of Outcomes: Not Progressing   LCSW Treatment Plan for Primary Diagnosis: Major depressive disorder, recurrent severe without psychotic features (HCC) Long Term Goal(s): Safe transition to appropriate next level of care at discharge, Engage patient in therapeutic group addressing interpersonal concerns.  Short Term Goals: Engage patient in aftercare planning with referrals and resources, Increase social support, Increase ability to appropriately verbalize feelings, Increase emotional regulation, Facilitate acceptance  of mental health diagnosis and concerns, and Increase skills for wellness and recovery  Therapeutic Interventions: Assess for all discharge needs, 1 to 1 time with Social worker, Explore available resources and support systems, Assess for adequacy in community support network, Educate family and significant other(s) on suicide prevention, Complete Psychosocial Assessment, Interpersonal group therapy.  Evaluation of Outcomes: Not Progressing   Progress in Treatment: Attending groups: Yes. Participating in groups: Yes. Taking medication as prescribed: Yes. Toleration medication: Yes. Family/Significant other contact made: Yes, individual(s) contacted:  SPE completed with the patient's mother.  Patient understands diagnosis: Yes. Discussing patient identified problems/goals with staff: Yes. Medical problems stabilized or resolved: Yes. Denies suicidal/homicidal ideation: No. Issues/concerns per patient self-inventory: Yes. Other: none  New problem(s) identified: Yes, Describe:  Patient writes repeatedly on his morning check in sheet that he needs help  New Short Term/Long Term Goal(s): medication management for mood stabilization; elimination of SI thoughts; development of comprehensive mental wellness/sobriety plan.  Update 12/15/2022:  No changes at this time.    Patient Goals:  Pt reports continued intrusive thoughts of wanting to hurt others. Update 12/15/2022:  No changes at this time.    Discharge Plan or Barriers: CSW will assist pt with development of an appropriate aftercare/discharge plan.  Update 12/15/2022:  Patient continues to remain isolative to his room.  He often states that he "needs serious help".  However is not able to elaborate on how he needs help.  Patient remains concerned that he is going to harm his granddaughter. Unclear at this time if patient can return to his home or not as the granddaughter also lives in the home.    Reason for Continuation of Hospitalization:  Anxiety Depression Homicidal ideation Medication stabilization Suicidal ideation   Estimated Length of Stay: 1-7 days Update 12/15/2022:  TBD  Last 3 [place] Suicide Severity Risk Score: Flowsheet Row Admission (Current) from 01/09/2023 in [facility]PATIENT BE[facility] MEDICINE ED from 01/08/2023 in [facility] Department [facility] Admission (Discharged) from 12/26/2022 in [facility] INPATIENT BEHAVIORAL MEDICINE  C-SSRS RISK CATEGORY Low Risk Moderate Risk No Risk       Last PHQ 2/9 Scores:     No data to display          Scribe for Treatment Team: Harden Mo, LCSW 01/14/2023 10:37 AM

## 2023-01-14 NOTE — Progress Notes (Signed)
 Pt calm and pleasant during assessment denying SI/HI/AVH. Pt stated that he just feels anxious. Pt compliant with medication administration per MD orders. Pt given education, support, and encouragement to be active in his treatment plan. Pt being monitored Q 15 minutes for safety per unit protocol, remains safe on the unit

## 2023-01-14 NOTE — Plan of Care (Signed)
  Problem: Education: Goal: Mental status will improve Outcome: Progressing Goal: Verbalization of understanding the information provided will improve Outcome: Progressing   Problem: Activity: Goal: Sleeping patterns will improve Outcome: Progressing   Problem: Coping: Goal: Ability to verbalize frustrations and anger appropriately will improve Outcome: Progressing Goal: Ability to demonstrate self-control will improve Outcome: Progressing   Problem: Health Behavior/Discharge Planning: Goal: Identification of resources available to assist in meeting health care needs will improve Outcome: Progressing   Problem: Physical Regulation: Goal: Ability to maintain clinical measurements within normal limits will improve Outcome: Progressing

## 2023-01-14 NOTE — Plan of Care (Signed)
  Problem: Activity: Goal: Interest or engagement in activities will improve Outcome: Progressing Goal: Sleeping patterns will improve Outcome: Progressing   Problem: Coping: Goal: Ability to verbalize frustrations and anger appropriately will improve Outcome: Progressing  Patient compliant with treatment denies SI/HI/A/VH and verbally contracts for safety. Q 15 minutes safety checks ongoing. Patient remains safe.

## 2023-01-14 NOTE — [facility] Counselor (Signed)
CSW met with pt after group, per request. He stated that no one in the group is going through what he is. Pt went on to share his thoughts about hurting others and that no one else experiences this. CSW reminded pt that everyone has their own issues but that his is not a singular experience. Pt and CSW discussed thoughts as just that thoughts. CSW asked pt about any intentions or behaviors which came from these thoughts. Pt denied ever having hurt anyone or having the intention to do such. Although pt denied this he was still tearful regarding his thoughts. Journaling was also discussed briefly as a way to get the thoughts out of his head. Pt denied any interest in reading, writing, or journaling. CSW encouraged pt to reconnect with things that he enjoys doing as a way to distract from this. Pt denied finding interest in doing anything that he used to do, stating that he is in a dark place. CSW encouraged him to try. Pt thanked CSW for time and ended the discussion. Contact ended without incident.   Vilma Meckel. Algis Greenhouse, MSW, LCSW, LCAS 01/14/2023 2:17 PM

## 2023-01-14 NOTE — Group Note (Signed)
[facility] LCSW Group Therapy Note    Group Date: 01/14/2023 Start Time: 1300 End Time: 1400  Type of Therapy and Topic:  Group Therapy:  Overcoming Obstacles  Participation Level:  [facility] PARTICIPATION LEVEL: None   Description of Group:   In this group patients will be encouraged to explore what they see as obstacles to their own wellness and recovery. They will be guided to discuss their thoughts, feelings, and behaviors related to these obstacles. The group will process together ways to cope with barriers, with attention given to specific choices patients can make. Each patient will be challenged to identify changes they are motivated to make in order to overcome their obstacles. This group will be process-oriented, with patients participating in exploration of their own experiences as well as giving and receiving support and challenge from other group members.  Therapeutic Goals: 1. Patient will identify personal and current obstacles as they relate to admission. 2. Patient will identify barriers that currently interfere with their wellness or overcoming obstacles.  3. Patient will identify feelings, thought process and behaviors related to these barriers. 4. Patient will identify two changes they are willing to make to overcome these obstacles:    Summary of Patient Progress Patient came into group half way through the discussion. During his time in the room he did not participate other than to say he needed to talk to someone after group.    Therapeutic Modalities:   Cognitive Behavioral Therapy Solution Focused Therapy Motivational Interviewing Relapse Prevention Therapy   Glenis Smoker, LCSW

## 2023-01-14 NOTE — Group Note (Signed)
Recreation Therapy Group Note   Group Topic:Problem Solving  Group Date: 01/14/2023 Start Time: 1000 End Time: 1100 Facilitators: Rosina Lowenstein, LRT, CTRS Location:  Craft Room  Group Description: Life Boat. Patients were given the scenario that they are on a boat that is about to become shipwrecked, leaving them stranded on an [place]. They are asked to make a list of 15 different items that they want to take with them when they are stranded on the [place]. Patients are asked to rank their items from most important to least important, #1 being the most important and #15 being the least. Patients will work individually for the first round to come up with 15 items and then pair up with a peer(s) to condense their list and come up with one list of 15 items between the two of them. Patients or LRT will read aloud the 15 different items to the group after each round. LRT facilitated post-activity processing to discuss how this activity can be used in daily life post discharge.   Goal Area(s) Addressed:  Patient will identify priorities, wants and needs. Patient will communicate with LRT and peers. Patient will work collectively as a Administrator, Civil Service. Patient will work on Product manager.    Affect/Mood: Flat   Participation Level: Moderate   Participation Quality: Independent   Behavior: Calm   Speech/Thought Process: Coherent   Insight: Limited   Judgement: Fair    Modes of Intervention: Group work, Dentist, and Team-building   Patient Response to Interventions:  Receptive   Education Outcome:  Acknowledges education   Clinical Observations/Individualized Feedback: Ronald [name] was minimally active in their participation of session activities and group discussion. Pt identified "meds, food, compass, books" as things he will bring with him on the [place]. Pt completed half his list. Pt minimally interacted with LRT or peers while in group.    Plan: Continue to engage patient in  RT group sessions 2-3x/week.   Rosina Lowenstein, LRT, CTRS 01/14/2023 1:15 PM

## 2023-01-14 NOTE — Group Note (Signed)
Date:  01/14/2023 Time:  10:23 AM  Group Topic/Focus:  Goals Group:   The focus of this group is to help patients establish daily goals to achieve during treatment and discuss how the patient can incorporate goal setting into their daily lives to aide in recovery.    Participation Level:  Active  Participation Quality:  Appropriate  Affect:  Appropriate  Cognitive:  Appropriate  Insight: Appropriate  Engagement in Group:  Engaged  Modes of Intervention:  Activity  Additional Comments:    Mary Sella Evart Mcdonnell 01/14/2023, 10:23 AM

## 2023-01-14 NOTE — Group Note (Signed)
Date:  01/14/2023 Time:  5:05 PM  Group Topic/Focus:  Activity Group: The focus of the group is to encourage patients to go outside in the courtyard and get some fresh ar and some exercise.    Participation Level:  Active  Participation Quality:  Appropriate  Affect:  Appropriate  Cognitive:  Appropriate  Insight: Appropriate  Engagement in Group:  Engaged  Modes of Intervention:  Activity  Additional Comments:    Ronald [name] 01/14/2023, 5:05 PM

## 2023-01-14 NOTE — Group Note (Signed)
Date:  01/14/2023 Time:  9:44 PM  Group Topic/Focus:  Orientation:   The focus of this group is to educate the patient on the purpose and policies of crisis stabilization and provide a format to answer questions about their admission.  The group details unit policies and expectations of patients while admitted.    Participation Level:  Active  Participation Quality:  Appropriate and Attentive  Affect:  Appropriate  Cognitive:  Alert and Appropriate  Insight: Appropriate and Good  Engagement in Group:  Developing/Improving and Engaged  Modes of Intervention:  Clarification, Discussion, Education, Orientation, Rapport Building, and Support  Additional Comments:     Martyn Timme 01/14/2023, 9:44 PM

## 2023-01-15 DIAGNOSIS — F1390 Sedative, hypnotic, or anxiolytic use, unspecified, uncomplicated: Secondary | ICD-10-CM | POA: Insufficient documentation

## 2023-01-15 DIAGNOSIS — F332 Major depressive disorder, recurrent severe without psychotic features: Secondary | ICD-10-CM | POA: Diagnosis not present

## 2023-01-15 NOTE — Progress Notes (Signed)
Assumed care of patient this am, he is A&O x 4,affect & mood remain sad, depressed and anxious. Pt out in the milieu, at intervals, minimum socialization  behavior  and she continues to c/o anxiety and having intrusive thoughts to hurt people.Pt denied thoughts, plan or intent to harm self and made a safety commitment, " I just can't control these thoughts that pops in my head". Pt educated on plan of care, medication regimen and she/he acknowledged an understanding and was without further complaints or concerns. Pt is maintained on q 15 min rounds.

## 2023-01-15 NOTE — Group Note (Signed)
Date:  01/15/2023 Time:  3:42 PM  Group Topic/Focus:  Overcoming Stress:   The focus of this group is to define stress and help patients assess their triggers.    Participation Level:  Did Not Attend   Mary Sella Jalen Daluz 01/15/2023, 3:42 PM

## 2023-01-15 NOTE — Plan of Care (Signed)
  Problem: Education: Goal: Emotional status will improve Outcome: Progressing Goal: Mental status will improve Outcome: Progressing Goal: Verbalization of understanding the information provided will improve Outcome: Progressing   Problem: Activity: Goal: Interest or engagement in activities will improve Outcome: Progressing Goal: Sleeping patterns will improve Outcome: Progressing   Problem: Coping: Goal: Ability to verbalize frustrations and anger appropriately will improve Outcome: Progressing Goal: Ability to demonstrate self-control will improve Outcome: Progressing   Problem: Health Behavior/Discharge Planning: Goal: Identification of resources available to assist in meeting health care needs will improve Outcome: Progressing Goal: Compliance with treatment plan for underlying cause of condition will improve Outcome: Progressing   

## 2023-01-15 NOTE — Group Note (Signed)
Date:  01/15/2023 Time:  10:09 AM  Group Topic/Focus:  Goals Group:   The focus of this group is to help patients establish daily goals to achieve during treatment and discuss how the patient can incorporate goal setting into their daily lives to aide in recovery.    Participation Level:  Did Not Attend   Lynelle Smoke [facility] 01/15/2023, 10:09 AM

## 2023-01-15 NOTE — Plan of Care (Signed)
Pt denies SI/HI/AVH, compliant with procedures on the unit  Problem: Education: Goal: Emotional status will improve Outcome: Progressing Goal: Mental status will improve Outcome: Progressing Goal: Verbalization of understanding the information provided will improve Outcome: Progressing   Problem: Activity: Goal: Interest or engagement in activities will improve Outcome: Progressing Goal: Sleeping patterns will improve Outcome: Progressing   Problem: Coping: Goal: Ability to verbalize frustrations and anger appropriately will improve Outcome: Progressing Goal: Ability to demonstrate self-control will improve Outcome: Progressing   Problem: Health Behavior/Discharge Planning: Goal: Identification of resources available to assist in meeting health care needs will improve Outcome: Progressing Goal: Compliance with treatment plan for underlying cause of condition will improve Outcome: Progressing   Problem: Physical Regulation: Goal: Ability to maintain clinical measurements within normal limits will improve Outcome: Progressing

## 2023-01-15 NOTE — Group Note (Signed)
Recreation Therapy Group Note   Group Topic:Healthy Support Systems  Group Date: 01/15/2023 Start Time: 1000 End Time: 1100 Facilitators: Rosina Lowenstein, LRT, CTRS Location:  Craft Room  Group Description: Straw Bridge. In groups or individually, patients were given 10 plastic drinking straws and an equal length of masking tape. Using the materials provided, patients were instructed to build a free-standing bridge-like structure to suspend an everyday item (ex: deck of cards) off the floor or table surface. All materials were required to be used in Secondary school teacher. LRT facilitated post-activity discussion reviewing the importance of having strong and healthy support systems in our lives. LRT discussed how the people in our lives serve as the tape and the deck of cards we placed on top of our straw structure are the stressors we face in daily life. LRT and pts discussed what happens in our life when things get too heavy for [place], and we don't have strong supports outside of the hospital. Pt shared 2 of their healthy supports in their life aloud in the group.   Goal Area(s) Addressed:  Patient will identify 2 healthy supports in their life. Patient will identify skills to successfully complete activity. Patient will identify correlation of this activity to life post-discharge.  Patient will build on frustration tolerance skills. Patient will increase team building and communication skills.    Affect/Mood: Flat   Participation Level: Minimal   Participation Quality: Independent   Behavior: On-looking   Speech/Thought Process: Coherent   Insight: Limited   Judgement: Limited   Modes of Intervention: Education, Group work, Dentist, and Team-building   Patient Response to Interventions:  Disengaged   Education Outcome:  In group clarification offered    Clinical Observations/Individualized Feedback: Ronald [name] was minimally active in their participation of session activities and  group discussion. Pt identified "mom and dog" as his healthy supports. Pt was in and out of group randomly multiple times. Pt did not participate in making a straw structure.    Plan: Continue to engage patient in RT group sessions 2-3x/week.   Rosina Lowenstein, LRT, CTRS  01/15/2023 1:13 PM

## 2023-01-15 NOTE — Progress Notes (Signed)
[facility] MD Progress Note  01/15/2023 3:22 PM Ronald [name].  MRN:  [id number] Subjective:  47 year old Caucasian male, states, "I have images of hurting people, and I can't shut them out." He denies suicidal ideation (SI) or auditory/visual hallucinations (AVH). During the interaction, the patient began crying and shaking, displaying significant emotional distress. Nursing staff noted the possibility of the patient "cheeking" medication. Principal Problem: Major depressive disorder, recurrent severe without psychotic features (HCC) Diagnosis: Principal Problem:   Major depressive disorder, recurrent severe without psychotic features (HCC) Active Problems:   Obsessive thinking  Total Time spent with patient: 1 hour  Past Psychiatric History: Depression  Past Medical History:  Past Medical History:  Diagnosis Date   Anxiety    Depression    Family history of adverse reaction to anesthesia    difficult to wake up - Mother   GERD (gastroesophageal reflux disease)    PTSD (post-traumatic stress disorder)     Past Surgical History:  Procedure Laterality Date   ELECTROCONVULSIVE THERAPY     FRACTURE SURGERY     wrist left   INSERTION OF MESH  08/25/2021   Procedure: INSERTION OF MESH;  Surgeon: Sung Amabile, DO;  Location: [facility] ORS;  Service: General;;   UMBILICAL HERNIA REPAIR  08/25/2021   Procedure: HERNIA REPAIR UMBILICAL ADULT;  Surgeon: Sung Amabile, DO;  Location: [facility] ORS;  Service: General;;   Family History: History reviewed. No pertinent family history. Family Psychiatric  History: none reported Social History:  Social History   Substance and Sexual Activity  Alcohol Use Yes   Comment: occasional     Social History   Substance and Sexual Activity  Drug Use No    Social History   Socioeconomic History   Marital status: Single    Spouse name: Not on file   Number of children: Not on file   Years of education: Not on file   Highest education level: Not on file   Occupational History   Not on file  Tobacco Use   Smoking status: Every Day    Current packs/day: 1.00    Average packs/day: 1 pack/day for 24.9 years (24.9 ttl pk-yrs)    Types: Cigarettes    Start date: 2000   Smokeless tobacco: Never  Vaping Use   Vaping status: Every Day  Substance and Sexual Activity   Alcohol use: Yes    Comment: occasional   Drug use: No   Sexual activity: Not on file  Other Topics Concern   Not on file  Social History Narrative   Not on file   Social Determinants of Health   Financial Resource Strain: Medium Risk (12/10/2022)   Received from [facility]   Overall Financial Resource Strain (CARDIA)    Difficulty of Paying Living Expenses: Somewhat hard  Food Insecurity: No Food Insecurity (01/09/2023)   Hunger Vital Sign    Worried About Running Out of Food in the Last Year: Never true    Ran Out of Food in the Last Year: Never true  Transportation Needs: No Transportation Needs (01/09/2023)   PRAPARE - Administrator, Civil Service (Medical): No    Lack of Transportation (Non-Medical): No  Physical Activity: Not on file  Stress: Not on file  Social Connections: Not on file   Additional Social History:                         Sleep: Good  Appetite:  Good  Current Medications: Current Facility-Administered Medications  Medication Dose Route Frequency Provider Last Rate Last Admin   acetaminophen (TYLENOL) tablet 650 mg  650 mg Oral Q6H PRN Dixon, Rashaun M, NP       alum & mag hydroxide-simeth (MAALOX/MYLANTA) 200-200-20 MG/5ML suspension 30 mL  30 mL Oral Q4H PRN Dixon, Rashaun M, NP       amLODipine (NORVASC) tablet 5 mg  5 mg Oral Daily Rex Kras, MD   5 mg at 01/15/23 0941   haloperidol (HALDOL) tablet 5 mg  5 mg Oral TID PRN Jearld Lesch, NP   5 mg at 01/14/23 2111   And   diphenhydrAMINE (BENADRYL) capsule 50 mg  50 mg Oral TID PRN Jearld Lesch, NP   50 mg at 01/14/23 2111   haloperidol lactate  (HALDOL) injection 5 mg  5 mg Intramuscular TID PRN Jearld Lesch, NP       And   diphenhydrAMINE (BENADRYL) injection 50 mg  50 mg Intramuscular TID PRN Jearld Lesch, NP       And   LORazepam (ATIVAN) injection 2 mg  2 mg Intramuscular TID PRN Jearld Lesch, NP       haloperidol lactate (HALDOL) injection 10 mg  10 mg Intramuscular TID PRN Jearld Lesch, NP       And   diphenhydrAMINE (BENADRYL) injection 50 mg  50 mg Intramuscular TID PRN Jearld Lesch, NP       And   LORazepam (ATIVAN) injection 2 mg  2 mg Intramuscular TID PRN Jearld Lesch, NP       fluvoxaMINE (LUVOX) tablet 100 mg  100 mg Oral QHS Rex Kras, MD   100 mg at 01/14/23 2112   fluvoxaMINE (LUVOX) tablet 50 mg  50 mg Oral Daily Rex Kras, MD   50 mg at 01/15/23 0941   gabapentin (NEURONTIN) capsule 300 mg  300 mg Oral TID Jearld Lesch, NP   300 mg at 01/15/23 1257   hydrOXYzine (ATARAX) tablet 50 mg  50 mg Oral TID PRN Jearld Lesch, NP   50 mg at 01/13/23 2109   magnesium hydroxide (MILK OF MAGNESIA) suspension 30 mL  30 mL Oral Daily PRN Jearld Lesch, NP       nicotine polacrilex (NICORETTE) gum 2 mg  2 mg Oral PRN Charm Rings, NP   2 mg at 01/13/23 1726   OLANZapine (ZYPREXA) tablet 7.5 mg  7.5 mg Oral QHS Dixon, Rashaun M, NP   7.5 mg at 01/14/23 2111   propranolol (INDERAL) tablet 10 mg  10 mg Oral BID Charm Rings, NP   10 mg at 01/15/23 1610    Lab Results: No results found for this or any previous visit (from the past 48 hour(s)).  Blood Alcohol level:  Lab Results  Component Value Date   ETH <10 01/08/2023   ETH <10 12/26/2022    Metabolic Disorder Labs: Lab Results  Component Value Date   HGBA1C 5.2 12/30/2022   MPG 102.54 12/30/2022   No results found for: "PROLACTIN" Lab Results  Component Value Date   CHOL 145 12/30/2022   TRIG 249 (H) 12/30/2022   HDL 27 (L) 12/30/2022   CHOLHDL 5.4 12/30/2022   VLDL 50 (H) 12/30/2022   LDLCALC 68 12/30/2022     Physical Findings: AIMS:  , ,  ,  ,    CIWA:    COWS:     Musculoskeletal: Strength & Muscle Tone:  within normal limits Gait & Station: normal Patient leans: N/A  Psychiatric Specialty Exam:  Presentation  General Appearance:  Appropriate for Environment; Neat  Eye Contact: Good  Speech: Clear and Coherent; Normal Rate  Speech Volume: Normal  Handedness: Right   Mood and Affect  Mood: Anxious; Irritable  Affect: Labile   Thought Process  Thought Processes: Coherent  Descriptions of Associations:Intact  Orientation:Full (Time, Place and Person)  Thought Content:Scattered  History of Schizophrenia/Schizoaffective disorder:No  Duration of Psychotic Symptoms:N/A  Hallucinations:Hallucinations: None  Ideas of Reference:None  Suicidal Thoughts:Suicidal Thoughts: No  Homicidal Thoughts:Homicidal Thoughts: No HI Active Intent and/or Plan: Without Intent; Without Plan; Without Access to Means; Without Means to Carry Out HI Passive Intent and/or Plan: Without Intent; Without Means to Carry Out; Without Access to Means; With Means to Carry Out   Sensorium  Memory: Immediate Good; Remote Good  Judgment: Poor  Insight: Poor   Executive Functions  Concentration: Poor  Attention Span: Fair  Recall: Good  Fund of Knowledge: Good  Language: Good   Psychomotor Activity  Psychomotor Activity: Psychomotor Activity: Normal   Assets  Assets: Communication Skills   Sleep  Sleep: Sleep: Good Number of Hours of Sleep: 8    Physical Exam: Physical Exam Vitals and nursing note reviewed.  Constitutional:      Appearance: Normal appearance.  HENT:     Head: Normocephalic and atraumatic.     Nose: Nose normal.  Pulmonary:     Effort: Pulmonary effort is normal.  Musculoskeletal:        General: Normal range of motion.     Cervical back: Normal range of motion.  Neurological:     General: No focal deficit present.      Mental Status: He is alert and oriented to person, place, and time. Mental status is at baseline.  Psychiatric:        Attention and Perception: Attention and perception normal.        Mood and Affect: Mood is anxious. Affect is flat.        Speech: Speech normal.        Behavior: Behavior is agitated.        Thought Content: Thought content includes homicidal ideation.        Cognition and Memory: Cognition and memory normal.        Judgment: Judgment normal.    Review of Systems  Psychiatric/Behavioral:  The patient is nervous/anxious.   All other systems reviewed and are negative.  Blood pressure (!) 132/90, pulse (!) 58, temperature 97.7 F (36.5 C), resp. rate (!) 24, height 5\' 7"  (1.702 m), weight 90.7 kg, SpO2 93%. Body mass index is 31.32 kg/m.   Treatment Plan Summary: Daily contact with patient to assess and evaluate symptoms and progress in treatment and Medication management Fluvoxamine (Luvox) 100 mg Oral QHS & 50 mg daily For obsessive-compulsive disorder (OCD) or major depressive disorder (MDD). Gabapentin (Neurontin) 300 mg Oral TID ubstance withdrawal symptoms. Olanzapine (Zyprexa) 7.5 mg Oral at bedtime r mood stabilization and managing psychosis.  Propranolol (Inderal) 10 mg Oral BID anxiety, tremors, and hypertension Engage case management or social work to explore housing options and address homelessness Myriam Forehand, NP 01/15/2023, 3:22 PM

## 2023-01-15 NOTE — Progress Notes (Signed)
 Pt calm and pleasant during assessment denying SI/HI/AVH. Pt stated that he just feels anxious. Pt compliant with medication administration per MD orders. Pt given education, support, and encouragement to be active in his treatment plan. Pt being monitored Q 15 minutes for safety per unit protocol, remains safe on the unit

## 2023-01-15 NOTE — Progress Notes (Signed)
[facility] MD Progress Note  01/14/2023 2:23 pm Ronald [name].  MRN:  [id number] Subjective:  47 year old Caucasian male who states, "I can't stop thinking about hurting people," and, "I have nowhere to live," but refuses to elaborate further. During the interaction, the patient began crying and shaking, displaying significant emotional distress. He denies current suicidal ideation (SI), homicidal ideation (HI), or auditory/visual hallucinations (AVH). The patient expressed dissatisfaction with the treatment environment, telling other patients in the milieu, "This is not a place to get mental health treatment." Principal Problem: Major depressive disorder, recurrent severe without psychotic features (HCC) Diagnosis: Principal Problem:   Major depressive disorder, recurrent severe without psychotic features (HCC) Active Problems:   Obsessive thinking  Total Time spent with patient: 2 hours  Past Psychiatric History: Anxiety Depression   Past Medical History:  Past Medical History:  Diagnosis Date   Anxiety    Depression    Family history of adverse reaction to anesthesia    difficult to wake up - Mother   GERD (gastroesophageal reflux disease)    PTSD (post-traumatic stress disorder)     Past Surgical History:  Procedure Laterality Date   ELECTROCONVULSIVE THERAPY     FRACTURE SURGERY     wrist left   INSERTION OF MESH  08/25/2021   Procedure: INSERTION OF MESH;  Surgeon: Sung Amabile, DO;  Location: [facility] ORS;  Service: General;;   UMBILICAL HERNIA REPAIR  08/25/2021   Procedure: HERNIA REPAIR UMBILICAL ADULT;  Surgeon: Sung Amabile, DO;  Location: [facility] ORS;  Service: General;;   Family History: History reviewed. No pertinent family history. Family Psychiatric  History: none reported Social History:  Social History   Substance and Sexual Activity  Alcohol Use Yes   Comment: occasional     Social History   Substance and Sexual Activity  Drug Use No    Social History    Socioeconomic History   Marital status: Single    Spouse name: Not on file   Number of children: Not on file   Years of education: Not on file   Highest education level: Not on file  Occupational History   Not on file  Tobacco Use   Smoking status: Every Day    Current packs/day: 1.00    Average packs/day: 1 pack/day for 24.9 years (24.9 ttl pk-yrs)    Types: Cigarettes    Start date: 2000   Smokeless tobacco: Never  Vaping Use   Vaping status: Every Day  Substance and Sexual Activity   Alcohol use: Yes    Comment: occasional   Drug use: No   Sexual activity: Not on file  Other Topics Concern   Not on file  Social History Narrative   Not on file   Social Determinants of Health   Financial Resource Strain: Medium Risk (12/10/2022)   Received from [facility]   Overall Financial Resource Strain (CARDIA)    Difficulty of Paying Living Expenses: Somewhat hard  Food Insecurity: No Food Insecurity (01/09/2023)   Hunger Vital Sign    Worried About Running Out of Food in the Last Year: Never true    Ran Out of Food in the Last Year: Never true  Transportation Needs: No Transportation Needs (01/09/2023)   PRAPARE - Administrator, Civil Service (Medical): No    Lack of Transportation (Non-Medical): No  Physical Activity: Not on file  Stress: Not on file  Social Connections: Not on file   Additional Social History:  Sleep: Good  Appetite:  Good  Current Medications: Current Facility-Administered Medications  Medication Dose Route Frequency Provider Last Rate Last Admin   acetaminophen (TYLENOL) tablet 650 mg  650 mg Oral Q6H PRN Dixon, Rashaun M, NP       alum & mag hydroxide-simeth (MAALOX/MYLANTA) 200-200-20 MG/5ML suspension 30 mL  30 mL Oral Q4H PRN Dixon, Rashaun M, NP       amLODipine (NORVASC) tablet 5 mg  5 mg Oral Daily Rex Kras, MD   5 mg at 01/15/23 0941   haloperidol (HALDOL) tablet 5 mg  5 mg Oral  TID PRN Jearld Lesch, NP   5 mg at 01/14/23 2111   And   diphenhydrAMINE (BENADRYL) capsule 50 mg  50 mg Oral TID PRN Jearld Lesch, NP   50 mg at 01/14/23 2111   haloperidol lactate (HALDOL) injection 5 mg  5 mg Intramuscular TID PRN Jearld Lesch, NP       And   diphenhydrAMINE (BENADRYL) injection 50 mg  50 mg Intramuscular TID PRN Jearld Lesch, NP       And   LORazepam (ATIVAN) injection 2 mg  2 mg Intramuscular TID PRN Jearld Lesch, NP       haloperidol lactate (HALDOL) injection 10 mg  10 mg Intramuscular TID PRN Jearld Lesch, NP       And   diphenhydrAMINE (BENADRYL) injection 50 mg  50 mg Intramuscular TID PRN Jearld Lesch, NP       And   LORazepam (ATIVAN) injection 2 mg  2 mg Intramuscular TID PRN Dixon, Rashaun M, NP       fluvoxaMINE (LUVOX) tablet 100 mg  100 mg Oral QHS Rex Kras, MD   100 mg at 01/14/23 2112   fluvoxaMINE (LUVOX) tablet 50 mg  50 mg Oral Daily Rex Kras, MD   50 mg at 01/15/23 0941   gabapentin (NEURONTIN) capsule 300 mg  300 mg Oral TID Jearld Lesch, NP   300 mg at 01/15/23 1257   hydrOXYzine (ATARAX) tablet 50 mg  50 mg Oral TID PRN Jearld Lesch, NP   50 mg at 01/13/23 2109   magnesium hydroxide (MILK OF MAGNESIA) suspension 30 mL  30 mL Oral Daily PRN Jearld Lesch, NP       nicotine polacrilex (NICORETTE) gum 2 mg  2 mg Oral PRN Charm Rings, NP   2 mg at 01/13/23 1726   OLANZapine (ZYPREXA) tablet 7.5 mg  7.5 mg Oral QHS Dixon, Rashaun M, NP   7.5 mg at 01/14/23 2111   propranolol (INDERAL) tablet 10 mg  10 mg Oral BID Charm Rings, NP   10 mg at 01/15/23 5409    Lab Results: No results found for this or any previous visit (from the past 48 hour(s)).  Blood Alcohol level:  Lab Results  Component Value Date   ETH <10 01/08/2023   ETH <10 12/26/2022    Metabolic Disorder Labs: Lab Results  Component Value Date   HGBA1C 5.2 12/30/2022   MPG 102.54 12/30/2022   No results found for:  "PROLACTIN" Lab Results  Component Value Date   CHOL 145 12/30/2022   TRIG 249 (H) 12/30/2022   HDL 27 (L) 12/30/2022   CHOLHDL 5.4 12/30/2022   VLDL 50 (H) 12/30/2022   LDLCALC 68 12/30/2022     Musculoskeletal: Strength & Muscle Tone: within normal limits Gait & Station: normal Patient leans: N/A  Psychiatric Specialty Exam:  Presentation  General Appearance:  Appropriate for Environment; Neat  Eye Contact: Good  Speech: Clear and Coherent; Normal Rate  Speech Volume: Normal  Handedness: Right   Mood and Affect  Mood: Anxious; Irritable  Affect: Labile   Thought Process  Thought Processes: Coherent  Descriptions of Associations:Intact  Orientation:Full (Time, Place and Person)  Thought Content:Scattered  History of Schizophrenia/Schizoaffective disorder:No  Duration of Psychotic Symptoms:N/A  Hallucinations:Hallucinations: None  Ideas of Reference:None  Suicidal Thoughts:Suicidal Thoughts: No  Homicidal Thoughts:Homicidal Thoughts: No HI Active Intent and/or Plan: Without Intent; Without Plan; Without Access to Means; Without Means to Carry Out HI Passive Intent and/or Plan: Without Intent; Without Means to Carry Out; Without Access to Means; With Means to Carry Out   Sensorium  Memory: Immediate Good; Remote Good  Judgment: Poor  Insight: Poor   Executive Functions  Concentration: Poor  Attention Span: Fair  Recall: Good  Fund of Knowledge: Good  Language: Good   Psychomotor Activity  Psychomotor Activity:Psychomotor Activity: Normal   Assets  Assets: Communication Skills   Sleep  Sleep:Sleep: Good Number of Hours of Sleep: 8    Physical Exam: Physical Exam Vitals and nursing note reviewed.  Constitutional:      Appearance: Normal appearance.  HENT:     Head: Normocephalic and atraumatic.     Nose: Nose normal.  Pulmonary:     Effort: Pulmonary effort is normal.  Musculoskeletal:         General: Normal range of motion.     Cervical back: Normal range of motion.  Neurological:     General: No focal deficit present.     Mental Status: He is alert and oriented to person, place, and time. Mental status is at baseline.  Psychiatric:        Attention and Perception: Attention and perception normal.        Mood and Affect: Mood is anxious. Affect is labile.        Speech: Speech normal.        Behavior: Behavior is withdrawn. Behavior is cooperative.        Thought Content: Thought content includes homicidal ideation.        Cognition and Memory: Cognition and memory normal.        Judgment: Judgment is impulsive.    ROS Blood pressure (!) 132/90, pulse (!) 58, temperature 97.7 F (36.5 C), resp. rate (!) 24, height 5\' 7"  (1.702 m), weight 90.7 kg, SpO2 93%. Body mass index is 31.32 kg/m.   Treatment Plan Summary: Daily contact with patient to assess and evaluate symptoms and progress in treatment and Medication management Fluvoxamine (Luvox) 100 mg Oral QHS & 50 mg daily For obsessive-compulsive disorder (OCD) or major depressive disorder (MDD). Gabapentin (Neurontin) 300 mg Oral TID ubstance withdrawal symptoms. Olanzapine (Zyprexa) 7.5 mg Oral at bedtime r mood stabilization and managing psychosis.  Propranolol (Inderal) 10 mg Oral BID anxiety, tremors, and hypertension Engage case management or social work to explore housing options and address homelessness Myriam Forehand, NP 01/15/2023, 3:16 PM

## 2023-01-15 NOTE — Group Note (Signed)
[facility] LCSW Group Therapy Note   Group Date: 01/15/2023 Start Time: 1300 End Time: 1400  Type of Therapy/Topic:  Group Therapy:  Feelings about Diagnosis  Participation Level:  None   Description of Group:    This group will allow patients to explore their thoughts and feelings about diagnoses they have received. Patients will be guided to explore their level of understanding and acceptance of these diagnoses. Facilitator will encourage patients to process their thoughts and feelings about the reactions of others to their diagnosis, and will guide patients in identifying ways to discuss their diagnosis with significant others in their lives. This group will be process-oriented, with patients participating in exploration of their own experiences as well as giving and receiving support and challenge from other group members.   Therapeutic Goals: 1. Patient will demonstrate understanding of diagnosis as evidence by identifying two or more symptoms of the disorder:  2. Patient will be able to express two feelings regarding the diagnosis 3. Patient will demonstrate ability to communicate their needs through discussion and/or role plays  Summary of Patient Progress: Patient was present in group.  Patient was attentive and appeared engaged.  However, patient did not engage in discussion topics.   Therapeutic Modalities:   Cognitive Behavioral Therapy Brief Therapy Feelings Identification    Harden Mo, LCSW

## 2023-01-15 NOTE — Group Note (Signed)
Date:  01/15/2023 Time:  9:10 PM  Group Topic/Focus:  Recovery Goals:   The focus of this group is to identify appropriate goals for recovery and establish a plan to achieve them. Wrap-Up Group:   The focus of this group is to help patients review their daily goal of treatment and discuss progress on daily workbooks.    Participation Level:  Active  Participation Quality:  Appropriate and Attentive  Affect:  Appropriate  Cognitive:  Alert and Appropriate  Insight: Appropriate  Engagement in Group:  Engaged  Modes of Intervention:  Discussion  Additional Comments:     Maglione,Shatoya Roets E 01/15/2023, 9:10 PM

## 2023-01-16 DIAGNOSIS — F332 Major depressive disorder, recurrent severe without psychotic features: Secondary | ICD-10-CM | POA: Diagnosis not present

## 2023-01-16 MED ORDER — PROPRANOLOL HCL 20 MG PO TABS
10.0000 mg | ORAL_TABLET | Freq: Every day | ORAL | Status: DC
  Administered 2023-01-17 – 2023-01-18 (×2): 10 mg via ORAL
  Filled 2023-01-16 (×2): qty 1

## 2023-01-16 NOTE — Group Note (Signed)
Recreation Therapy Group Note   Group Topic:Relaxation  Group Date: 01/16/2023 Start Time: 1000 End Time: 1050 Facilitators: Rosina Lowenstein, LRT, CTRS Location:  Craft Room  Group Description: PMR (Progressive Muscle Relaxation). LRT asks patients their current level of stress/anxiety from 1-10, with 10 being the highest. LRT educates patients on what PMR is and the benefits that come from it. Patients are asked to sit with their feet flat on the floor while sitting up and all the way back in their chair, if possible. LRT and pts follow a prompt through a speaker that requires you to tense and release different muscles in their body and focus on their breathing. During session, lights are off and soft music is being played. Pts are given a stress ball to use if needed. At the end of the prompt, LRT asks patients to rank their current levels of stress/anxiety from 1-10, 10 being the highest. LRT provides patients with an education handout on PMR.   Goal Area(s) Addressed:  Patients will be able to describe progressive muscle relaxation.  Patient will practice using relaxation technique. Patient will identify a new coping skill.  Patient will follow multistep directions to reduce anxiety and stress.   Affect/Mood: N/A   Participation Level: Did not attend    Clinical Observations/Individualized Feedback: Ronald [name] did not attend group.   Plan: Continue to engage patient in RT group sessions 2-3x/week.   Rosina Lowenstein, LRT, CTRS 01/16/2023 1:05 PM

## 2023-01-16 NOTE — Progress Notes (Signed)
   01/16/23 1000  Psych Admission Type (Psych Patients Only)  Admission Status Voluntary  Psychosocial Assessment  Patient Complaints Anxiety  Eye Contact Fair  Facial Expression Blank;Flat  Affect Depressed;Anxious  Speech Logical/coherent  Interaction Assertive  Motor Activity Slow  Appearance/Hygiene UTA  Behavior Characteristics Cooperative;Anxious  Thought Process  Coherency WDL  Content Blaming self  Delusions None reported or observed  Perception WDL  Hallucination None reported or observed  Judgment Impaired  Confusion None  Danger to Self  Current suicidal ideation? Denies  Agreement Not to Harm Self Yes  Description of Agreement verbal  Danger to Others  Danger to Others None reported or observed  Danger to Others Abnormal  Harmful Behavior to others No threats or harm toward other people  Destructive Behavior No threats or harm toward property

## 2023-01-16 NOTE — Group Note (Signed)
Date:  01/16/2023 Time:  3:27 PM  Group Topic/Focus:  Healthy Communication:   The focus of this group is to discuss communication, barriers to communication, as well as healthy ways to communicate with others.    Participation Level:  Did Not Attend   Mary Sella Duha Abair 01/16/2023, 3:27 PM

## 2023-01-16 NOTE — Group Note (Signed)
Date:  01/16/2023 Time:  10:35 AM  Group Topic/Focus:  Building Self Esteem:   The Focus of this group is helping patients become aware of the effects of self-esteem on their lives, the things they and others do that enhance or undermine their self-esteem, seeing the relationship between their level of self-esteem and the choices they make and learning ways to enhance self-esteem.    Participation Level:  Did Not Attend   Ronald [name] 01/16/2023, 10:35 AM

## 2023-01-16 NOTE — Plan of Care (Signed)
  Problem: Education: Goal: Emotional status will improve 01/16/2023 1058 by Delos Haring, RN Outcome: Progressing 01/16/2023 1058 by Delos Haring, RN Outcome: Not Progressing Goal: Mental status will improve Outcome: Progressing Goal: Verbalization of understanding the information provided will improve Outcome: Progressing   Problem: Activity: Goal: Interest or engagement in activities will improve Outcome: Progressing Goal: Sleeping patterns will improve Outcome: Progressing   Problem: Coping: Goal: Ability to verbalize frustrations and anger appropriately will improve Outcome: Progressing Goal: Ability to demonstrate self-control will improve Outcome: Progressing   Problem: Health Behavior/Discharge Planning: Goal: Identification of resources available to assist in meeting health care needs will improve Outcome: Progressing Goal: Compliance with treatment plan for underlying cause of condition will improve Outcome: Progressing   Problem: Physical Regulation: Goal: Ability to maintain clinical measurements within normal limits will improve Outcome: Progressing

## 2023-01-16 NOTE — Group Note (Addendum)
LCSW Group Therapy Note   Group Date: 01/16/2023 Start Time: 1300 End Time: 1400   Type of Therapy and Topic:  Group Therapy: Challenging Core Beliefs  Participation Level:  Did Not Attend  Description of Group:  Patients were educated about core beliefs and asked to identify one harmful core belief that they have. Patients were asked to explore from where those beliefs originate. Patients were asked to discuss how those beliefs make them feel and the resulting behaviors of those beliefs. They were then be asked if those beliefs are true and, if so, what evidence they have to support them. Lastly, group members were challenged to replace those negative core beliefs with helpful beliefs.   Therapeutic Goals:   1. Patient will identify harmful core beliefs and explore the origins of such beliefs. 2. Patient will identify feelings and behaviors that result from those core beliefs. 3. Patient will discuss whether such beliefs are true. 4.  Patient will replace harmful core beliefs with helpful ones.  Summary of Patient Progress:  Patient did not attend.   Therapeutic Modalities: Cognitive Behavioral Therapy; Solution-Focused Therapy   Lowry Ram, LCSWA 01/16/2023  3:15 PM

## 2023-01-16 NOTE — Group Note (Signed)
Date:  01/16/2023 Time:  9:43 PM  Group Topic/Focus:  Stages of Change:   The focus of this group is to explain the stages of change and help patients identify changes they want to make upon discharge.    Participation Level:  Active  Participation Quality:  Appropriate, Attentive, and Sharing  Affect:  Appropriate  Cognitive:  Alert and Appropriate  Insight: Appropriate, Good, and Improving  Engagement in Group:  Developing/Improving  Modes of Intervention:  Activity, Clarification, Discussion, Education, Rapport Building, Dance movement psychotherapist, Role-play, and Support  Additional Comments:     [name] 01/16/2023, 9:43 PM

## 2023-01-16 NOTE — Progress Notes (Signed)
[facility] MD Progress Note  01/16/2023 5:48 PM Ronald [name].  MRN:  [id number] Subjective:   47 year old Caucasian male admitted voluntarily for management of anxiety and depressive symptoms. He reports feeling anxious and exhibits self-blame but denies current suicidal ideation (SI) or homicidal ideation (HI). He has verbally agreed not to harm himself. The patient describes ongoing struggles with anxiety but is cooperative and engaged during the assessment.   Principal Problem: Major depressive disorder, recurrent severe without psychotic features (HCC) Diagnosis: Principal Problem:   Major depressive disorder, recurrent severe without psychotic features (HCC) Active Problems:   Obsessive thinking  Total Time spent with patient: 45 minutes  Past Psychiatric History: see below  Past Medical History:  Past Medical History:  Diagnosis Date   Anxiety    Depression    Family history of adverse reaction to anesthesia    difficult to wake up - Mother   GERD (gastroesophageal reflux disease)    PTSD (post-traumatic stress disorder)     Past Surgical History:  Procedure Laterality Date   ELECTROCONVULSIVE THERAPY     FRACTURE SURGERY     wrist left   INSERTION OF MESH  08/25/2021   Procedure: INSERTION OF MESH;  Surgeon: Sung Amabile, DO;  Location: [facility] ORS;  Service: General;;   UMBILICAL HERNIA REPAIR  08/25/2021   Procedure: HERNIA REPAIR UMBILICAL ADULT;  Surgeon: Sung Amabile, DO;  Location: [facility] ORS;  Service: General;;   Family History: History reviewed. No pertinent family history. Family Psychiatric  History: none reported Social History:  Social History   Substance and Sexual Activity  Alcohol Use Yes   Comment: occasional     Social History   Substance and Sexual Activity  Drug Use No    Social History   Socioeconomic History   Marital status: Single    Spouse name: Not on file   Number of children: Not on file   Years of education: Not on file   Highest  education level: Not on file  Occupational History   Not on file  Tobacco Use   Smoking status: Every Day    Current packs/day: 1.00    Average packs/day: 1 pack/day for 24.9 years (24.9 ttl pk-yrs)    Types: Cigarettes    Start date: 2000   Smokeless tobacco: Never  Vaping Use   Vaping status: Every Day  Substance and Sexual Activity   Alcohol use: Yes    Comment: occasional   Drug use: No   Sexual activity: Not on file  Other Topics Concern   Not on file  Social History Narrative   Not on file   Social Determinants of Health   Financial Resource Strain: Medium Risk (12/10/2022)   Received from [facility]   Overall Financial Resource Strain (CARDIA)    Difficulty of Paying Living Expenses: Somewhat hard  Food Insecurity: No Food Insecurity (01/09/2023)   Hunger Vital Sign    Worried About Running Out of Food in the Last Year: Never true    Ran Out of Food in the Last Year: Never true  Transportation Needs: No Transportation Needs (01/09/2023)   PRAPARE - Administrator, Civil Service (Medical): No    Lack of Transportation (Non-Medical): No  Physical Activity: Not on file  Stress: Not on file  Social Connections: Not on file   Additional Social History:  Sleep: Good  Appetite:  Good  Current Medications: Current Facility-Administered Medications  Medication Dose Route Frequency Provider Last Rate Last Admin   acetaminophen (TYLENOL) tablet 650 mg  650 mg Oral Q6H PRN Jearld Lesch, NP   650 mg at 01/16/23 1152   alum & mag hydroxide-simeth (MAALOX/MYLANTA) 200-200-20 MG/5ML suspension 30 mL  30 mL Oral Q4H PRN Dixon, Rashaun M, NP       amLODipine (NORVASC) tablet 5 mg  5 mg Oral Daily Rex Kras, MD   5 mg at 01/16/23 0740   haloperidol (HALDOL) tablet 5 mg  5 mg Oral TID PRN Jearld Lesch, NP   5 mg at 01/14/23 2111   And   diphenhydrAMINE (BENADRYL) capsule 50 mg  50 mg Oral TID PRN Jearld Lesch,  NP   50 mg at 01/15/23 2107   haloperidol lactate (HALDOL) injection 5 mg  5 mg Intramuscular TID PRN Jearld Lesch, NP       And   diphenhydrAMINE (BENADRYL) injection 50 mg  50 mg Intramuscular TID PRN Jearld Lesch, NP       And   LORazepam (ATIVAN) injection 2 mg  2 mg Intramuscular TID PRN Jearld Lesch, NP       haloperidol lactate (HALDOL) injection 10 mg  10 mg Intramuscular TID PRN Jearld Lesch, NP       And   diphenhydrAMINE (BENADRYL) injection 50 mg  50 mg Intramuscular TID PRN Jearld Lesch, NP       And   LORazepam (ATIVAN) injection 2 mg  2 mg Intramuscular TID PRN Durwin Nora, Rashaun M, NP       fluvoxaMINE (LUVOX) tablet 100 mg  100 mg Oral QHS Rex Kras, MD   100 mg at 01/15/23 2107   fluvoxaMINE (LUVOX) tablet 50 mg  50 mg Oral Daily Rex Kras, MD   50 mg at 01/16/23 0740   gabapentin (NEURONTIN) capsule 300 mg  300 mg Oral TID Jearld Lesch, NP   300 mg at 01/16/23 1635   hydrOXYzine (ATARAX) tablet 50 mg  50 mg Oral TID PRN Jearld Lesch, NP   50 mg at 01/16/23 1635   magnesium hydroxide (MILK OF MAGNESIA) suspension 30 mL  30 mL Oral Daily PRN Jearld Lesch, NP       nicotine polacrilex (NICORETTE) gum 2 mg  2 mg Oral PRN Charm Rings, NP   2 mg at 01/16/23 1152   OLANZapine (ZYPREXA) tablet 7.5 mg  7.5 mg Oral QHS Dixon, Rashaun M, NP   7.5 mg at 01/15/23 2108   propranolol (INDERAL) tablet 10 mg  10 mg Oral BID Charm Rings, NP   10 mg at 01/16/23 1635    Lab Results: No results found for this or any previous visit (from the past 48 hour(s)).  Blood Alcohol level:  Lab Results  Component Value Date   ETH <10 01/08/2023   ETH <10 12/26/2022    Metabolic Disorder Labs: Lab Results  Component Value Date   HGBA1C 5.2 12/30/2022   MPG 102.54 12/30/2022   No results found for: "PROLACTIN" Lab Results  Component Value Date   CHOL 145 12/30/2022   TRIG 249 (H) 12/30/2022   HDL 27 (L) 12/30/2022   CHOLHDL 5.4 12/30/2022    VLDL 50 (H) 12/30/2022   LDLCALC 68 12/30/2022    Physical Findings: AIMS:  , ,  ,  ,    CIWA:  COWS:     Musculoskeletal: Strength & Muscle Tone: within normal limits Gait & Station: normal Patient leans: N/A  Psychiatric Specialty Exam:  Presentation  General Appearance:  Appropriate for Environment; Neat  Eye Contact: Good  Speech: Normal Rate; Clear and Coherent  Speech Volume: Normal  Handedness: Right   Mood and Affect  Mood: Anxious; Irritable  Affect: Tearful   Thought Process  Thought Processes: Coherent  Descriptions of Associations:Intact  Orientation:Full (Time, Place and Person)  Thought Content:Illogical  History of Schizophrenia/Schizoaffective disorder:No  Duration of Psychotic Symptoms:N/A  Hallucinations:Hallucinations: None  Ideas of Reference:None  Suicidal Thoughts:Suicidal Thoughts: No  Homicidal Thoughts:Homicidal Thoughts: No HI Active Intent and/or Plan: Without Intent; Without Plan; Without Access to Means; Without Means to Carry Out HI Passive Intent and/or Plan: Without Intent; Without Means to Carry Out; Without Access to Means (intrusive thoughts to hurt people no one specific)   Sensorium  Memory: Immediate Good; Remote Good  Judgment: Poor  Insight: Fair   Chartered certified accountant: Fair  Attention Span: Fair  Recall: Good  Fund of Knowledge: Good  Language: Good   Psychomotor Activity  Psychomotor Activity: Psychomotor Activity: Normal   Assets  Assets: Communication Skills   Sleep  Sleep: Sleep: Good Number of Hours of Sleep: 7    Physical Exam: Physical Exam Psychiatric:        Attention and Perception: Attention and perception normal.        Speech: Speech normal.        Behavior: Behavior normal. Behavior is cooperative.        Cognition and Memory: Cognition and memory normal.        Judgment: Judgment is impulsive.    Review of Systems   Psychiatric/Behavioral:  The patient is nervous/anxious.   All other systems reviewed and are negative.  Blood pressure 139/88, pulse (!) 55, temperature 97.7 F (36.5 C), temperature source Oral, resp. rate 18, height 5\' 7"  (1.702 m), weight 90.7 kg, SpO2 97%. Body mass index is 31.32 kg/m.   Treatment Plan Summary: Daily contact with patient to assess and evaluate symptoms and progress in treatment and Medication management Fluvoxamine (Luvox) 100 mg Oral QHS & 50 mg daily For obsessive-compulsive disorder (OCD) or major depressive disorder (MDD). Gabapentin (Neurontin) 300 mg Oral TID ubstance withdrawal symptoms. Olanzapine (Zyprexa) 7.5 mg Oral at bedtime r mood stabilization and managing psychosis.  Propranolol (Inderal) 10 mg Oral Daily anxiety, tremors, and hypertension Engage case management or social work to explore housing options and address homelessness Myriam Forehand, NP 01/16/2023, 5:48 PM

## 2023-01-16 NOTE — Progress Notes (Signed)
 Pt calm and pleasant during assessment denying SI/HI/AVH. Pt stated that he just feels anxious. Pt compliant with medication administration per MD orders. Pt given education, support, and encouragement to be active in his treatment plan. Pt being monitored Q 15 minutes for safety per unit protocol, remains safe on the unit

## 2023-01-17 DIAGNOSIS — F332 Major depressive disorder, recurrent severe without psychotic features: Secondary | ICD-10-CM | POA: Diagnosis not present

## 2023-01-17 MED ORDER — MELATONIN 5 MG PO TABS
10.0000 mg | ORAL_TABLET | Freq: Every day | ORAL | Status: DC
  Administered 2023-01-17 – 2023-01-21 (×5): 10 mg via ORAL
  Filled 2023-01-17 (×5): qty 2

## 2023-01-17 MED ORDER — OLANZAPINE 10 MG PO TABS
10.0000 mg | ORAL_TABLET | Freq: Every day | ORAL | Status: DC
  Administered 2023-01-17 – 2023-01-19 (×3): 10 mg via ORAL
  Filled 2023-01-17 (×3): qty 1

## 2023-01-17 NOTE — OR Nursing (Signed)
Patient presently pleasantly to medication room.  Denies si,hi,avh and pain. Took pm medications.

## 2023-01-17 NOTE — Progress Notes (Signed)
[facility] MD Progress Note  01/18/2023 3:54 PM [name].  MRN:  [id number] Subjective:  47 year old Caucasian male who presents with complaints of sleep difficulties and requests more sleep medication, stating, "Can I get more sleep medication?" He reports intrusive thoughts about hurting people, though he clarifies that these thoughts are not directed at anyone in particular. He denies suicidal ideation (SI), homicidal ideation (HI), and auditory or visual hallucinations (AVH). The patient appears distressed by the intrusive thoughts but expresses a desire for improved sleep as a priority. Principal Problem: Major depressive disorder, recurrent severe without psychotic features (HCC) Diagnosis: Principal Problem:   Major depressive disorder, recurrent severe without psychotic features (HCC) Active Problems:   Obsessive thinking  Total Time spent with patient: 1 hour  Past Psychiatric History: see below  Past Medical History:  Past Medical History:  Diagnosis Date   Anxiety    Depression    Family history of adverse reaction to anesthesia    difficult to wake up - Mother   GERD (gastroesophageal reflux disease)    PTSD (post-traumatic stress disorder)     Past Surgical History:  Procedure Laterality Date   ELECTROCONVULSIVE THERAPY     FRACTURE SURGERY     wrist left   INSERTION OF MESH  08/25/2021   Procedure: INSERTION OF MESH;  Surgeon: Sung Amabile, DO;  Location: [facility] ORS;  Service: General;;   UMBILICAL HERNIA REPAIR  08/25/2021   Procedure: HERNIA REPAIR UMBILICAL ADULT;  Surgeon: Sung Amabile, DO;  Location: [facility] ORS;  Service: General;;   Family History: History reviewed. No pertinent family history. Family Psychiatric  History: none reported Social History:  Social History   Substance and Sexual Activity  Alcohol Use Yes   Comment: occasional     Social History   Substance and Sexual Activity  Drug Use No    Social History   Socioeconomic History    Marital status: Single    Spouse name: Not on file   Number of children: Not on file   Years of education: Not on file   Highest education level: Not on file  Occupational History   Not on file  Tobacco Use   Smoking status: Every Day    Current packs/day: 1.00    Average packs/day: 1 pack/day for 24.9 years (24.9 ttl pk-yrs)    Types: Cigarettes    Start date: 2000   Smokeless tobacco: Never  Vaping Use   Vaping status: Every Day  Substance and Sexual Activity   Alcohol use: Yes    Comment: occasional   Drug use: No   Sexual activity: Not on file  Other Topics Concern   Not on file  Social History Narrative   Not on file   Social Drivers of Health   Financial Resource Strain: Medium Risk (12/10/2022)   Received from [facility]   Overall Financial Resource Strain (CARDIA)    Difficulty of Paying Living Expenses: Somewhat hard  Food Insecurity: No Food Insecurity (01/09/2023)   Hunger Vital Sign    Worried About Running Out of Food in the Last Year: Never true    Ran Out of Food in the Last Year: Never true  Transportation Needs: No Transportation Needs (01/09/2023)   PRAPARE - Administrator, Civil Service (Medical): No    Lack of Transportation (Non-Medical): No  Physical Activity: Not on file  Stress: Not on file  Social Connections: Not on file   Additional Social History:  Sleep: Fair  Appetite:  Good  Current Medications: Current Facility-Administered Medications  Medication Dose Route Frequency Provider Last Rate Last Admin   acetaminophen (TYLENOL) tablet 650 mg  650 mg Oral Q6H PRN Jearld Lesch, NP   650 mg at 01/16/23 1152   alum & mag hydroxide-simeth (MAALOX/MYLANTA) 200-200-20 MG/5ML suspension 30 mL  30 mL Oral Q4H PRN Jearld Lesch, NP       amLODipine (NORVASC) tablet 5 mg  5 mg Oral Daily Rex Kras, MD   5 mg at 01/18/23 0851   haloperidol (HALDOL) tablet 5 mg  5 mg Oral TID PRN Jearld Lesch, NP   5 mg at 01/14/23 2111   And   diphenhydrAMINE (BENADRYL) capsule 50 mg  50 mg Oral TID PRN Jearld Lesch, NP   50 mg at 01/16/23 2102   haloperidol lactate (HALDOL) injection 5 mg  5 mg Intramuscular TID PRN Jearld Lesch, NP       And   diphenhydrAMINE (BENADRYL) injection 50 mg  50 mg Intramuscular TID PRN Jearld Lesch, NP       And   LORazepam (ATIVAN) injection 2 mg  2 mg Intramuscular TID PRN Jearld Lesch, NP       haloperidol lactate (HALDOL) injection 10 mg  10 mg Intramuscular TID PRN Jearld Lesch, NP       And   diphenhydrAMINE (BENADRYL) injection 50 mg  50 mg Intramuscular TID PRN Jearld Lesch, NP       And   LORazepam (ATIVAN) injection 2 mg  2 mg Intramuscular TID PRN Jearld Lesch, NP       fluvoxaMINE (LUVOX) tablet 100 mg  100 mg Oral QHS Rex Kras, MD   100 mg at 01/17/23 2102   fluvoxaMINE (LUVOX) tablet 50 mg  50 mg Oral Daily Rex Kras, MD   50 mg at 01/18/23 0851   gabapentin (NEURONTIN) capsule 300 mg  300 mg Oral TID Jearld Lesch, NP   300 mg at 01/18/23 1200   hydrOXYzine (ATARAX) tablet 50 mg  50 mg Oral TID PRN Jearld Lesch, NP   50 mg at 01/17/23 2102   magnesium hydroxide (MILK OF MAGNESIA) suspension 30 mL  30 mL Oral Daily PRN Jearld Lesch, NP       melatonin tablet 10 mg  10 mg Oral QHS Myriam Forehand, NP   10 mg at 01/17/23 2101   nicotine polacrilex (NICORETTE) gum 2 mg  2 mg Oral PRN Charm Rings, NP   2 mg at 01/17/23 1707   OLANZapine (ZYPREXA) tablet 10 mg  10 mg Oral QHS Myriam Forehand, NP   10 mg at 01/17/23 2102   propranolol (INDERAL) tablet 10 mg  10 mg Oral Daily Myriam Forehand, NP   10 mg at 01/18/23 1610    Lab Results: No results found for this or any previous visit (from the past 48 hours).  Blood Alcohol level:  Lab Results  Component Value Date   ETH <10 01/08/2023   ETH <10 12/26/2022    Metabolic Disorder Labs: Lab Results  Component Value Date   HGBA1C 5.2  12/30/2022   MPG 102.54 12/30/2022   No results found for: "PROLACTIN" Lab Results  Component Value Date   CHOL 145 12/30/2022   TRIG 249 (H) 12/30/2022   HDL 27 (L) 12/30/2022   CHOLHDL 5.4 12/30/2022   VLDL 50 (H) 12/30/2022   LDLCALC  68 12/30/2022    Physical Findings: AIMS:  , ,  ,  ,    CIWA:    COWS:     Musculoskeletal: Strength & Muscle Tone: within normal limits Gait & Station: normal Patient leans: N/A  Psychiatric Specialty Exam:  Presentation  General Appearance:  Appropriate for Environment; Neat  Eye Contact: Minimal  Speech: Clear and Coherent; Normal Rate  Speech Volume: Normal  Handedness: Right   Mood and Affect  Mood: Anxious; Irritable  Affect: Flat   Thought Process  Thought Processes: Coherent  Descriptions of Associations:Intact  Orientation:Full (Time, Place and Person)  Thought Content:Obsessions; Illogical (about hurting people)  History of Schizophrenia/Schizoaffective disorder:No  Duration of Psychotic Symptoms:N/A  Hallucinations:Hallucinations: Visual Description of Visual Hallucinations: blurred images  Ideas of Reference:None  Suicidal Thoughts:Suicidal Thoughts: No  Homicidal Thoughts:Homicidal Thoughts: No HI Active Intent and/or Plan: -- (denies) HI Passive Intent and/or Plan: -- (denies)   Sensorium  Memory: Immediate Good; Remote Good  Judgment: Poor  Insight: Poor   Executive Functions  Concentration: Fair  Attention Span: Fair  Recall: Good  Fund of Knowledge: Good  Language: Good   Psychomotor Activity  Psychomotor Activity: Psychomotor Activity: Normal   Assets  Assets: Communication Skills; Social Support   Sleep  Sleep: Sleep: Good Number of Hours of Sleep: 7    Physical Exam: Physical Exam Vitals and nursing note reviewed.  Constitutional:      Appearance: Normal appearance.  HENT:     Head: Normocephalic and atraumatic.     Right Ear: Tympanic  membrane normal.     Nose: Nose normal.  Pulmonary:     Effort: Pulmonary effort is normal.  Musculoskeletal:        General: Normal range of motion.     Cervical back: Normal range of motion.  Neurological:     General: No focal deficit present.     Mental Status: He is alert and oriented to person, place, and time. Mental status is at baseline.  Psychiatric:        Attention and Perception: Attention and perception normal.        Mood and Affect: Mood is anxious. Affect is flat.        Speech: Speech normal.        Behavior: Behavior is withdrawn. Behavior is cooperative.        Thought Content: Thought content normal.        Cognition and Memory: Memory normal. Cognition is impaired.        Judgment: Judgment is impulsive.    ROS Blood pressure 124/84, pulse (!) 52, temperature 97.9 F (36.6 C), temperature source Oral, resp. rate 20, height 5\' 7"  (1.702 m), weight 90.7 kg, SpO2 96%. Body mass index is 31.32 kg/m.   Treatment Plan Summary: Daily contact with patient to assess and evaluate symptoms and progress in treatment and Medication management Fluvoxamine (Luvox) 100 mg Oral QHS & 50 mg daily For obsessive-compulsive disorder (OCD) or major depressive disorder (MDD). Gabapentin (Neurontin) 300 mg Oral TID ubstance withdrawal symptoms. ncrease Zyprexa (olanzapine) to 10 mg nightly: To address intrusive thoughts and mood dysregulation. Initiate melatonin 10 mg nightly: To assist with sleep without adding sedative burden.. Propranolol (Inderal) 10 mg Oral Daily anxiety, tremors, and hypertension Engage case management or social work to explore housing options and address homelessness  Myriam Forehand, NP 01/18/2023, 3:54 PM

## 2023-01-17 NOTE — Group Note (Signed)
Date:  01/17/2023 Time:  8:59 PM  Group Topic/Focus:  Primary and Secondary Emotions:   The focus of this group is to discuss the difference between primary and secondary emotions.    Participation Level:  Active  Participation Quality:  Appropriate  Affect:  Appropriate  Cognitive:  Alert and Appropriate  Insight: Appropriate and Good  Engagement in Group:  Developing/Improving and Engaged  Modes of Intervention:  Discussion, Rapport Building, and Support  Additional Comments:     Ruie Sendejo 01/17/2023, 8:59 PM

## 2023-01-17 NOTE — Plan of Care (Signed)

## 2023-01-17 NOTE — Group Note (Signed)
[facility] LCSW Group Therapy Note   Group Date: 01/17/2023 Start Time: 1300 End Time: 1400   Type of Therapy/Topic:  Group Therapy:  Balance in Life  Participation Level:  Did Not Attend   Description of Group:    This group will address the concept of balance and how it feels and looks when one is unbalanced. Patients will be encouraged to process areas in their lives that are out of balance, and identify reasons for remaining unbalanced. Facilitators will guide patients utilizing problem- solving interventions to address and correct the stressor making their life unbalanced. Understanding and applying boundaries will be explored and addressed for obtaining  and maintaining a balanced life. Patients will be encouraged to explore ways to assertively make their unbalanced needs known to significant others in their lives, using other group members and facilitator for support and feedback.  Therapeutic Goals: Patient will identify two or more emotions or situations they have that consume much of in their lives. Patient will identify signs/triggers that life has become out of balance:  Patient will identify two ways to set boundaries in order to achieve balance in their lives:  Patient will demonstrate ability to communicate their needs through discussion and/or role plays  Summary of Patient Progress: X   Therapeutic Modalities:   Cognitive Behavioral Therapy Solution-Focused Therapy Assertiveness Training   Glenis Smoker, LCSW

## 2023-01-17 NOTE — Plan of Care (Signed)
Pt compliant with procedures on the unit  Problem: Education: Goal: Emotional status will improve Outcome: Progressing Goal: Mental status will improve Outcome: Progressing Goal: Verbalization of understanding the information provided will improve Outcome: Progressing   Problem: Activity: Goal: Interest or engagement in activities will improve Outcome: Progressing Goal: Sleeping patterns will improve Outcome: Progressing   Problem: Coping: Goal: Ability to verbalize frustrations and anger appropriately will improve Outcome: Progressing Goal: Ability to demonstrate self-control will improve Outcome: Progressing   Problem: Health Behavior/Discharge Planning: Goal: Identification of resources available to assist in meeting health care needs will improve Outcome: Progressing Goal: Compliance with treatment plan for underlying cause of condition will improve Outcome: Progressing   Problem: Physical Regulation: Goal: Ability to maintain clinical measurements within normal limits will improve Outcome: Progressing

## 2023-01-17 NOTE — Group Note (Signed)
Date:  01/17/2023 Time:  11:27 AM  Group Topic/Focus:  Goals Group:   The focus of this group is to help patients establish daily goals to achieve during treatment and discuss how the patient can incorporate goal setting into their daily lives to aide in recovery.   Participation Level:  Did Not Attend   Ronald [name] [name] 01/17/2023, 11:27 AM

## 2023-01-17 NOTE — Progress Notes (Signed)
   01/17/23 1113  Psych Admission Type (Psych Patients Only)  Admission Status Voluntary  Psychosocial Assessment  Patient Complaints Worrying  Eye Contact Brief  Facial Expression Animated  Affect Anxious  Speech Logical/coherent  Interaction Assertive  Motor Activity Slow  Appearance/Hygiene Unremarkable  Behavior Characteristics Cooperative  Mood Anxious  Thought Process  Coherency WDL  Danger to Self  Current suicidal ideation? Denies  Self-Injurious Behavior No self-injurious ideation or behavior indicators observed or expressed   Agreement Not to Harm Self Yes  Description of Agreement verbal  Danger to Others  Danger to Others None reported or observed  Danger to Others Abnormal  Harmful Behavior to others No threats or harm toward other people  Destructive Behavior No threats or harm toward property

## 2023-01-18 DIAGNOSIS — F332 Major depressive disorder, recurrent severe without psychotic features: Secondary | ICD-10-CM | POA: Diagnosis not present

## 2023-01-18 MED ORDER — PROPRANOLOL HCL 10 MG PO TABS
5.0000 mg | ORAL_TABLET | Freq: Every day | ORAL | Status: DC
  Administered 2023-01-19 – 2023-01-22 (×4): 5 mg via ORAL
  Filled 2023-01-18 (×4): qty 0.5

## 2023-01-18 NOTE — Group Note (Signed)
Date:  01/18/2023 Time:  5:44 PM  Group Topic/Focus:  Wellness Toolbox:   The focus of this group is to discuss various aspects of wellness, balancing those aspects and exploring ways to increase the ability to experience wellness.  Patients will create a wellness toolbox for use upon discharge.    Participation Level:  Did Not Attend   Lynelle Smoke [facility] 01/18/2023, 5:44 PM

## 2023-01-18 NOTE — Group Note (Signed)
Date:  01/18/2023 Time:  11:04 AM  Group Topic/Focus:  Goals Group:   The focus of this group is to help patients establish daily goals to achieve during treatment and discuss how the patient can incorporate goal setting into their daily lives to aide in recovery. Wellness Toolbox:   The focus of this group is to discuss various aspects of wellness, balancing those aspects and exploring ways to increase the ability to experience wellness.  Patients will create a wellness toolbox for use upon discharge.    Participation Level:  Did Not Attend   Lynelle Smoke [facility] 01/18/2023, 11:04 AM

## 2023-01-18 NOTE — Plan of Care (Signed)
  Problem: Education: Goal: Emotional status will improve Outcome: Progressing Goal: Mental status will improve Outcome: Progressing   Problem: Activity: Goal: Interest or engagement in activities will improve Outcome: Progressing Goal: Sleeping patterns will improve Outcome: Progressing   Problem: Coping: Goal: Ability to verbalize frustrations and anger appropriately will improve Outcome: Progressing Goal: Ability to demonstrate self-control will improve Outcome: Progressing   Problem: Health Behavior/Discharge Planning: Goal: Identification of resources available to assist in meeting health care needs will improve Outcome: Progressing Goal: Compliance with treatment plan for underlying cause of condition will improve Outcome: Progressing

## 2023-01-18 NOTE — Progress Notes (Signed)
   01/18/23 1113  Psych Admission Type (Psych Patients Only)  Admission Status Voluntary  Psychosocial Assessment  Patient Complaints Worrying  Eye Contact Brief  Facial Expression Animated  Affect Anxious  Speech Logical/coherent  Interaction Assertive  Motor Activity Slow  Appearance/Hygiene Unremarkable  Behavior Characteristics Cooperative  Mood Anxious  Thought Process  Coherency WDL  Content WDL  Delusions None reported or observed  Perception WDL  Hallucination None reported or observed  Judgment WDL  Confusion None  Danger to Self  Current suicidal ideation? Denies  Self-Injurious Behavior No self-injurious ideation or behavior indicators observed or expressed   Agreement Not to Harm Self Yes  Description of Agreement verbal  Danger to Others  Danger to Others None reported or observed  Danger to Others Abnormal  Harmful Behavior to others No threats or harm toward other people  Destructive Behavior No threats or harm toward property

## 2023-01-18 NOTE — Group Note (Signed)
Date:  01/18/2023 Time:  8:55 PM  Group Topic/Focus:  Wrap-Up Group:   The focus of this group is to help patients review their daily goal of treatment and discuss progress on daily workbooks.    Participation Level:  Active  Participation Quality:  Appropriate and Attentive  Affect:  Appropriate  Cognitive:  Alert and Appropriate  Insight: Appropriate  Engagement in Group:  Engaged  Modes of Intervention:  Discussion  Additional Comments:     Maglione,Mckyle Solanki E 01/18/2023, 8:55 PM

## 2023-01-18 NOTE — Progress Notes (Signed)
[facility] MD Progress Note  01/18/2023 4:07 PM Ronald [name].  MRN:  [id number] Subjective:  47 year old Caucasian male, reports improvement in sleep, stating, "I'm sleeping better." He denies any side effects from the current medication regimen. The patient continues to experience occasional intrusive thoughts about harming people, though he reiterates they are not directed at anyone in particular. He denies suicidal ideation (SI), homicidal ideation (HI), and auditory or visual hallucinations (AVH).   Principal Problem: Major depressive disorder, recurrent severe without psychotic features (HCC) Diagnosis: Principal Problem:   Major depressive disorder, recurrent severe without psychotic features (HCC) Active Problems:   Obsessive thinking  Total Time spent with patient: 45 minutes  Past Psychiatric History: see below  Past Medical History:  Past Medical History:  Diagnosis Date   Anxiety    Depression    Family history of adverse reaction to anesthesia    difficult to wake up - Mother   GERD (gastroesophageal reflux disease)    PTSD (post-traumatic stress disorder)     Past Surgical History:  Procedure Laterality Date   ELECTROCONVULSIVE THERAPY     FRACTURE SURGERY     wrist left   INSERTION OF MESH  08/25/2021   Procedure: INSERTION OF MESH;  Surgeon: Sung Amabile, DO;  Location: [facility] ORS;  Service: General;;   UMBILICAL HERNIA REPAIR  08/25/2021   Procedure: HERNIA REPAIR UMBILICAL ADULT;  Surgeon: Sung Amabile, DO;  Location: [facility] ORS;  Service: General;;   Family History: History reviewed. No pertinent family history. Family Psychiatric  History: none reported Social History:  Social History   Substance and Sexual Activity  Alcohol Use Yes   Comment: occasional     Social History   Substance and Sexual Activity  Drug Use No    Social History   Socioeconomic History   Marital status: Single    Spouse name: Not on file   Number of children: Not on file    Years of education: Not on file   Highest education level: Not on file  Occupational History   Not on file  Tobacco Use   Smoking status: Every Day    Current packs/day: 1.00    Average packs/day: 1 pack/day for 24.9 years (24.9 ttl pk-yrs)    Types: Cigarettes    Start date: 2000   Smokeless tobacco: Never  Vaping Use   Vaping status: Every Day  Substance and Sexual Activity   Alcohol use: Yes    Comment: occasional   Drug use: No   Sexual activity: Not on file  Other Topics Concern   Not on file  Social History Narrative   Not on file   Social Drivers of Health   Financial Resource Strain: Medium Risk (12/10/2022)   Received from [facility]   Overall Financial Resource Strain (CARDIA)    Difficulty of Paying Living Expenses: Somewhat hard  Food Insecurity: No Food Insecurity (01/09/2023)   Hunger Vital Sign    Worried About Running Out of Food in the Last Year: Never true    Ran Out of Food in the Last Year: Never true  Transportation Needs: No Transportation Needs (01/09/2023)   PRAPARE - Administrator, Civil Service (Medical): No    Lack of Transportation (Non-Medical): No  Physical Activity: Not on file  Stress: Not on file  Social Connections: Not on file   Additional Social History:  Sleep: Good  Appetite:  Good  Current Medications: Current Facility-Administered Medications  Medication Dose Route Frequency Provider Last Rate Last Admin   acetaminophen (TYLENOL) tablet 650 mg  650 mg Oral Q6H PRN Jearld Lesch, NP   650 mg at 01/16/23 1152   alum & mag hydroxide-simeth (MAALOX/MYLANTA) 200-200-20 MG/5ML suspension 30 mL  30 mL Oral Q4H PRN Dixon, Rashaun M, NP       amLODipine (NORVASC) tablet 5 mg  5 mg Oral Daily Rex Kras, MD   5 mg at 01/18/23 0851   haloperidol (HALDOL) tablet 5 mg  5 mg Oral TID PRN Jearld Lesch, NP   5 mg at 01/14/23 2111   And   diphenhydrAMINE (BENADRYL) capsule 50 mg   50 mg Oral TID PRN Jearld Lesch, NP   50 mg at 01/16/23 2102   haloperidol lactate (HALDOL) injection 5 mg  5 mg Intramuscular TID PRN Jearld Lesch, NP       And   diphenhydrAMINE (BENADRYL) injection 50 mg  50 mg Intramuscular TID PRN Jearld Lesch, NP       And   LORazepam (ATIVAN) injection 2 mg  2 mg Intramuscular TID PRN Jearld Lesch, NP       haloperidol lactate (HALDOL) injection 10 mg  10 mg Intramuscular TID PRN Jearld Lesch, NP       And   diphenhydrAMINE (BENADRYL) injection 50 mg  50 mg Intramuscular TID PRN Jearld Lesch, NP       And   LORazepam (ATIVAN) injection 2 mg  2 mg Intramuscular TID PRN Dixon, Rashaun M, NP       fluvoxaMINE (LUVOX) tablet 100 mg  100 mg Oral QHS Rex Kras, MD   100 mg at 01/17/23 2102   fluvoxaMINE (LUVOX) tablet 50 mg  50 mg Oral Daily Rex Kras, MD   50 mg at 01/18/23 0851   gabapentin (NEURONTIN) capsule 300 mg  300 mg Oral TID Jearld Lesch, NP   300 mg at 01/18/23 1200   hydrOXYzine (ATARAX) tablet 50 mg  50 mg Oral TID PRN Jearld Lesch, NP   50 mg at 01/17/23 2102   magnesium hydroxide (MILK OF MAGNESIA) suspension 30 mL  30 mL Oral Daily PRN Jearld Lesch, NP       melatonin tablet 10 mg  10 mg Oral QHS Myriam Forehand, NP   10 mg at 01/17/23 2101   nicotine polacrilex (NICORETTE) gum 2 mg  2 mg Oral PRN Charm Rings, NP   2 mg at 01/17/23 1707   OLANZapine (ZYPREXA) tablet 10 mg  10 mg Oral QHS Myriam Forehand, NP   10 mg at 01/17/23 2102   propranolol (INDERAL) tablet 10 mg  10 mg Oral Daily Myriam Forehand, NP   10 mg at 01/18/23 5188    Lab Results: No results found for this or any previous visit (from the past 48 hours).  Blood Alcohol level:  Lab Results  Component Value Date   ETH <10 01/08/2023   ETH <10 12/26/2022    Metabolic Disorder Labs: Lab Results  Component Value Date   HGBA1C 5.2 12/30/2022   MPG 102.54 12/30/2022   No results found for: "PROLACTIN" Lab Results   Component Value Date   CHOL 145 12/30/2022   TRIG 249 (H) 12/30/2022   HDL 27 (L) 12/30/2022   CHOLHDL 5.4 12/30/2022   VLDL 50 (H) 12/30/2022   LDLCALC  68 12/30/2022    Physical Findings: AIMS:  , ,  ,  ,    CIWA:    COWS:     Musculoskeletal: Strength & Muscle Tone: within normal limits Gait & Station: normal Patient leans: N/A  Psychiatric Specialty Exam:  Presentation  General Appearance:  Appropriate for Environment; Neat  Eye Contact: Minimal  Speech: Clear and Coherent; Normal Rate  Speech Volume: Normal  Handedness: Right   Mood and Affect  Mood: Anxious; Irritable  Affect: Flat   Thought Process  Thought Processes: Coherent  Descriptions of Associations:Intact  Orientation:Full (Time, Place and Person)  Thought Content:Obsessions  History of Schizophrenia/Schizoaffective disorder:No  Duration of Psychotic Symptoms:N/A  Hallucinations:Hallucinations: Visual Description of Visual Hallucinations: images  Ideas of Reference:None  Suicidal Thoughts:Suicidal Thoughts: No  Homicidal Thoughts:Homicidal Thoughts: No HI Active Intent and/or Plan: -- (denies) HI Passive Intent and/or Plan: -- (denies)   Sensorium  Memory: Immediate Good; Remote Good  Judgment: Fair  Insight: Fair   Art therapist  Concentration: Good  Attention Span: Fair  Recall: Good  Fund of Knowledge: Good  Language: Good   Psychomotor Activity  Psychomotor Activity: Psychomotor Activity: Normal   Assets  Assets: Communication Skills; Social Support   Sleep  Sleep: Sleep: Good Number of Hours of Sleep: 7    Physical Exam: Physical Exam Vitals and nursing note reviewed.  Constitutional:      Appearance: Normal appearance.  HENT:     Head: Normocephalic and atraumatic.     Nose: Nose normal.  Pulmonary:     Effort: Pulmonary effort is normal.  Musculoskeletal:        General: Normal range of motion.     Cervical  back: Normal range of motion.  Neurological:     General: No focal deficit present.     Mental Status: He is alert and oriented to person, place, and time. Mental status is at baseline.  Psychiatric:        Attention and Perception: Attention and perception normal.        Mood and Affect: Mood is anxious. Affect is flat.        Speech: Speech normal.        Behavior: Behavior normal. Behavior is cooperative.        Thought Content: Thought content normal.        Cognition and Memory: Memory normal. Cognition is impaired.        Judgment: Judgment normal.    Review of Systems  Psychiatric/Behavioral:  The patient is nervous/anxious.   All other systems reviewed and are negative.  Blood pressure 124/84, pulse (!) 52, temperature 97.9 F (36.6 C), temperature source Oral, resp. rate 20, height 5\' 7"  (1.702 m), weight 90.7 kg, SpO2 96%. Body mass index is 31.32 kg/m.   Treatment Plan Summary: Daily contact with patient to assess and evaluate symptoms and progress in treatment and Medication management Fluvoxamine (Luvox) 100 mg Oral QHS & 50 mg daily For obsessive-compulsive disorder (OCD) or major depressive disorder (MDD). Gabapentin (Neurontin) 300 mg Oral TID ubstance withdrawal symptoms. Zyprexa (olanzapine) to 10 mg nightly: To address intrusive thoughts and mood dysregulation. melatonin 10 mg nightly: To assist with sleep without adding sedative burden.. Propranolol (Inderal) 5 mg Oral Daily anxiety, tremors, and hypertension Myriam Forehand, NP 01/18/2023, 4:07 PM

## 2023-01-19 DIAGNOSIS — F332 Major depressive disorder, recurrent severe without psychotic features: Secondary | ICD-10-CM | POA: Diagnosis not present

## 2023-01-19 NOTE — Plan of Care (Signed)
  Problem: Education: Goal: Emotional status will improve Outcome: Progressing Goal: Mental status will improve Outcome: Progressing Goal: Verbalization of understanding the information provided will improve Outcome: Progressing   Problem: Activity: Goal: Interest or engagement in activities will improve Outcome: Progressing Goal: Sleeping patterns will improve Outcome: Progressing   Problem: Coping: Goal: Ability to verbalize frustrations and anger appropriately will improve Outcome: Progressing Goal: Ability to demonstrate self-control will improve Outcome: Progressing   Problem: Coping: Goal: Ability to verbalize frustrations and anger appropriately will improve Outcome: Progressing Goal: Ability to demonstrate self-control will improve Outcome: Progressing

## 2023-01-19 NOTE — Group Note (Signed)
Date:  01/19/2023 Time:  1:09 PM  Group Topic/Focus:  Conflict Resolution:   The focus of this group is to discuss the conflict resolution process and how it may be used upon discharge. Emotional Education:   The focus of this group is to discuss what feelings/emotions are, and how they are experienced. Managing Feelings:   The focus of this group is to identify what feelings patients have difficulty handling and develop a plan to handle them in a healthier way upon discharge.    Participation Level:  Did Not Attend  Participation Quality:    Affect:    Cognitive:    Insight:   Engagement in Group:    Modes of Intervention:    Additional Comments:    Hadiyah Maricle 01/19/2023, 1:09 PM

## 2023-01-19 NOTE — Group Note (Signed)
Date:  01/19/2023 Time:  10:10 PM  Group Topic/Focus:  Wrap-Up Group:   The focus of this group is to help patients review their daily goal of treatment and discuss progress on daily workbooks.    Participation Level:  Active  Participation Quality:  Appropriate, Attentive, and Resistant  Affect:  Appropriate  Cognitive:  Alert and Appropriate  Insight: Limited  Engagement in Group:  Engaged and Supportive  Modes of Intervention:  Discussion  Additional Comments:     Maglione,Delmo Matty E 01/19/2023, 10:10 PM

## 2023-01-19 NOTE — BH IP Treatment Plan (Signed)
Interdisciplinary Treatment and Diagnostic Plan Update  01/19/2023 Time of Session: 11:16 am Ronald [name]. MRN: [id number]  Principal Diagnosis: Major depressive disorder, recurrent severe without psychotic features (HCC)  Secondary Diagnoses: Principal Problem:   Major depressive disorder, recurrent severe without psychotic features (HCC) Active Problems:   Obsessive thinking   Current Medications:  Current Facility-Administered Medications  Medication Dose Route Frequency Provider Last Rate Last Admin   acetaminophen (TYLENOL) tablet 650 mg  650 mg Oral Q6H PRN Jearld Lesch, NP   650 mg at 01/16/23 1152   alum & mag hydroxide-simeth (MAALOX/MYLANTA) 200-200-20 MG/5ML suspension 30 mL  30 mL Oral Q4H PRN Jearld Lesch, NP       amLODipine (NORVASC) tablet 5 mg  5 mg Oral Daily Rex Kras, MD   5 mg at 01/19/23 1027   haloperidol (HALDOL) tablet 5 mg  5 mg Oral TID PRN Jearld Lesch, NP   5 mg at 01/14/23 2111   And   diphenhydrAMINE (BENADRYL) capsule 50 mg  50 mg Oral TID PRN Jearld Lesch, NP   50 mg at 01/16/23 2102   haloperidol lactate (HALDOL) injection 5 mg  5 mg Intramuscular TID PRN Jearld Lesch, NP       And   diphenhydrAMINE (BENADRYL) injection 50 mg  50 mg Intramuscular TID PRN Jearld Lesch, NP       And   LORazepam (ATIVAN) injection 2 mg  2 mg Intramuscular TID PRN Jearld Lesch, NP       haloperidol lactate (HALDOL) injection 10 mg  10 mg Intramuscular TID PRN Jearld Lesch, NP       And   diphenhydrAMINE (BENADRYL) injection 50 mg  50 mg Intramuscular TID PRN Jearld Lesch, NP       And   LORazepam (ATIVAN) injection 2 mg  2 mg Intramuscular TID PRN Dixon, Rashaun M, NP       fluvoxaMINE (LUVOX) tablet 100 mg  100 mg Oral QHS Rex Kras, MD   100 mg at 01/18/23 2111   fluvoxaMINE (LUVOX) tablet 50 mg  50 mg Oral Daily Rex Kras, MD   50 mg at 01/19/23 2536   gabapentin (NEURONTIN) capsule 300 mg  300 mg Oral TID  Jearld Lesch, NP   300 mg at 01/19/23 6440   hydrOXYzine (ATARAX) tablet 50 mg  50 mg Oral TID PRN Jearld Lesch, NP   50 mg at 01/17/23 2102   magnesium hydroxide (MILK OF MAGNESIA) suspension 30 mL  30 mL Oral Daily PRN Jearld Lesch, NP       melatonin tablet 10 mg  10 mg Oral QHS Myriam Forehand, NP   10 mg at 01/18/23 2111   nicotine polacrilex (NICORETTE) gum 2 mg  2 mg Oral PRN Charm Rings, NP   2 mg at 01/19/23 0823   OLANZapine (ZYPREXA) tablet 10 mg  10 mg Oral QHS Myriam Forehand, NP   10 mg at 01/18/23 2111   propranolol (INDERAL) tablet 5 mg  5 mg Oral Daily Myriam Forehand, NP   5 mg at 01/19/23 3474   PTA Medications: Medications Prior to Admission  Medication Sig Dispense Refill Last Dose/Taking   escitalopram (LEXAPRO) 10 MG tablet Take 1 tablet (10 mg total) by mouth daily. 30 tablet 0    gabapentin (NEURONTIN) 300 MG capsule Take 1 capsule (300 mg total) by mouth 3 (three) times daily. 90 capsule 0  hydrOXYzine (ATARAX) 50 MG tablet Take 1 tablet (50 mg total) by mouth 3 (three) times daily as needed for anxiety. 30 tablet 0    Multiple Vitamin (MULTIVITAMIN WITH MINERALS) TABS tablet Take 1 tablet by mouth daily. 30 tablet 0    nicotine (NICODERM CQ - DOSED IN MG/24 HOURS) 21 mg/24hr patch Place 1 patch (21 mg total) onto the skin daily for 28 days. 28 patch 0    nicotine polacrilex (NICORETTE) 2 MG gum Take 1 each (2 mg total) by mouth as needed for smoking cessation. 100 tablet 0    OLANZapine (ZYPREXA) 5 MG tablet Take 1.5 tablets (7.5 mg total) by mouth at bedtime. 45 tablet 0    propranolol (INDERAL) 10 MG tablet Take 1 tablet (10 mg total) by mouth daily. 30 tablet 0    QUEtiapine (SEROQUEL) 200 MG tablet Take 1 tablet (200 mg total) by mouth at bedtime. 30 tablet 0    thiamine (VITAMIN B-1) 100 MG tablet Take 1 tablet (100 mg total) by mouth daily. 30 tablet 0    traZODone (DESYREL) 50 MG tablet Take 1 tablet (50 mg total) by mouth at bedtime as needed for  sleep. 30 tablet 0     Patient Stressors: Marital or family conflict   Substance abuse    Patient Strengths: Supportive family/friends   Treatment Modalities: Medication Management, Group therapy, Case management,  1 to 1 session with clinician, Psychoeducation, Recreational therapy.   Physician Treatment Plan for Primary Diagnosis: Major depressive disorder, recurrent severe without psychotic features (HCC) Long Term Goal(s): Improvement in symptoms so as ready for discharge   Short Term Goals: Ability to identify changes in lifestyle to reduce recurrence of condition will improve Ability to verbalize feelings will improve Ability to disclose and discuss suicidal ideas Ability to demonstrate self-control will improve Ability to identify and develop effective coping behaviors will improve Ability to maintain clinical measurements within normal limits will improve Compliance with prescribed medications will improve Ability to identify triggers associated with substance abuse/mental health issues will improve  Medication Management: Evaluate patient's response, side effects, and tolerance of medication regimen.  Therapeutic Interventions: 1 to 1 sessions, Unit Group sessions and Medication administration.  Evaluation of Outcomes: Progressing  Physician Treatment Plan for Secondary Diagnosis: Principal Problem:   Major depressive disorder, recurrent severe without psychotic features (HCC) Active Problems:   Obsessive thinking  Long Term Goal(s): Improvement in symptoms so as ready for discharge   Short Term Goals: Ability to identify changes in lifestyle to reduce recurrence of condition will improve Ability to verbalize feelings will improve Ability to disclose and discuss suicidal ideas Ability to demonstrate self-control will improve Ability to identify and develop effective coping behaviors will improve Ability to maintain clinical measurements within normal limits will  improve Compliance with prescribed medications will improve Ability to identify triggers associated with substance abuse/mental health issues will improve     Medication Management: Evaluate patient's response, side effects, and tolerance of medication regimen.  Therapeutic Interventions: 1 to 1 sessions, Unit Group sessions and Medication administration.  Evaluation of Outcomes: Progressing   RN Treatment Plan for Primary Diagnosis: Major depressive disorder, recurrent severe without psychotic features (HCC) Long Term Goal(s): Knowledge of disease and therapeutic regimen to maintain health will improve  Short Term Goals: Ability to remain free from injury will improve, Ability to verbalize frustration and anger appropriately will improve, Ability to demonstrate self-control, Ability to participate in decision making will improve, Ability to verbalize feelings will improve,  Ability to disclose and discuss suicidal ideas, Ability to identify and develop effective coping behaviors will improve, and Compliance with prescribed medications will improve  Medication Management: RN will administer medications as ordered by provider, will assess and evaluate patient's response and provide education to patient for prescribed medication. RN will report any adverse and/or side effects to prescribing provider.  Therapeutic Interventions: 1 on 1 counseling sessions, Psychoeducation, Medication administration, Evaluate responses to treatment, Monitor vital signs and CBGs as ordered, Perform/monitor CIWA, COWS, AIMS and Fall Risk screenings as ordered, Perform wound care treatments as ordered.  Evaluation of Outcomes: Progressing   LCSW Treatment Plan for Primary Diagnosis: Major depressive disorder, recurrent severe without psychotic features (HCC) Long Term Goal(s): Safe transition to appropriate next level of care at discharge, Engage patient in therapeutic group addressing interpersonal  concerns.  Short Term Goals: Engage patient in aftercare planning with referrals and resources, Increase social support, Increase ability to appropriately verbalize feelings, Increase emotional regulation, Facilitate acceptance of mental health diagnosis and concerns, and Increase skills for wellness and recovery  Therapeutic Interventions: Assess for all discharge needs, 1 to 1 time with Social worker, Explore available resources and support systems, Assess for adequacy in community support network, Educate family and significant other(s) on suicide prevention, Complete Psychosocial Assessment, Interpersonal group therapy.  Evaluation of Outcomes: Progressing   Progress in Treatment: Attending groups: No. Participating in groups: No. Taking medication as prescribed: Yes. Toleration medication: Yes. Family/Significant other contact made: Yes, individual(s) contacted:  Dewayne Hatch, mom, [phone] Patient understands diagnosis: Yes. Discussing patient identified problems/goals with staff: Yes. Medical problems stabilized or resolved: Yes. Denies suicidal/homicidal ideation: Yes. Issues/concerns per patient self-inventory: No. Other: none  New problem(s) identified: Yes, Describe:  Patient writes repeatedly on his morning check in sheet that he needs help Update 01/19/2023: No changes at this time   New Short Term/Long Term Goal(s): medication management for mood stabilization; elimination of SI thoughts; development of comprehensive mental wellness/sobriety plan.  Update 12/15/2022:  No changes at this time. Update 01/19/2023: No changes at this time   Patient Goals:  Pt reports continued intrusive thoughts of wanting to hurt others. Update 12/15/2022:  No changes at this time. Update 01/19/2023: No changes at this time   Discharge Plan or Barriers: CSW will assist pt with development of an appropriate aftercare/discharge plan.  Update 12/15/2022:  Patient continues to remain isolative  to his room.  He often states that he "needs serious help".  However is not able to elaborate on how he needs help.  Patient remains concerned that he is going to harm his granddaughter. Unclear at this time if patient can return to his home or not as the granddaughter also lives in the home. Update 01/19/2023: No changes at this time   Reason for Continuation of Hospitalization: Anxiety Depression Homicidal ideation Medication stabilization Suicidal ideation   Estimated Length of Stay: 1-7 days Update 12/15/2022:  TBD Update 01/19/2023: TBD  Last 3 [place] Suicide Severity Risk Score: Flowsheet Row Admission (Current) from 01/09/2023 in [facility] INPATIENT BE[facility]AL MEDICINE ED from 01/08/2023 in [facility] at [facility] Admission (Discharged) from 12/26/2022 in [facility] INPATIENT BEHAVIORAL MEDICINE  C-SSRS RISK CATEGORY Low Risk Moderate Risk No Risk       Last PHQ 2/9 Scores:     No data to display          Scribe for Treatment Team: Marshell Levan, LCSW 01/19/2023 11:16 AM

## 2023-01-19 NOTE — BH Assessment (Signed)
patient presently pleasantly to medication room. Denies si,hi,avh and pain.

## 2023-01-19 NOTE — Progress Notes (Signed)
[facility] MD Progress Note  01/20/2023 5:36 PM Ronald [name].  MRN:  [id number] Subjective:  47 year old Caucasian male reporting intrusive thoughts about harming others but clarifies, "no one certain." He states, "I can't get the thoughts out of my head, that's why I don't attend group." The patient denies any intent to act on these thoughts and denies suicidal ideation (SI), homicidal ideation (HI), auditory or visual hallucinations (AVH), or paranoia. He reports these intrusive thoughts are distressing and interfere with his ability to participate in group therapy. Principal Problem: Major depressive disorder, recurrent severe without psychotic features (HCC) Diagnosis: Principal Problem:   Major depressive disorder, recurrent severe without psychotic features (HCC) Active Problems:   Obsessive thinking  Total Time spent with patient: 45 minutes  Past Psychiatric History: see below  Past Medical History:  Past Medical History:  Diagnosis Date   Anxiety    Depression    Family history of adverse reaction to anesthesia    difficult to wake up - Mother   GERD (gastroesophageal reflux disease)    PTSD (post-traumatic stress disorder)     Past Surgical History:  Procedure Laterality Date   ELECTROCONVULSIVE THERAPY     FRACTURE SURGERY     wrist left   INSERTION OF MESH  08/25/2021   Procedure: INSERTION OF MESH;  Surgeon: Sung Amabile, DO;  Location: [facility] ORS;  Service: General;;   UMBILICAL HERNIA REPAIR  08/25/2021   Procedure: HERNIA REPAIR UMBILICAL ADULT;  Surgeon: Sung Amabile, DO;  Location: [facility] ORS;  Service: General;;   Family History: History reviewed. No pertinent family history. Family Psychiatric  History: none reported Social History:  Social History   Substance and Sexual Activity  Alcohol Use Yes   Comment: occasional     Social History   Substance and Sexual Activity  Drug Use No    Social History   Socioeconomic History   Marital status: Single     Spouse name: Not on file   Number of children: Not on file   Years of education: Not on file   Highest education level: Not on file  Occupational History   Not on file  Tobacco Use   Smoking status: Every Day    Current packs/day: 1.00    Average packs/day: 1 pack/day for 25.0 years (25.0 ttl pk-yrs)    Types: Cigarettes    Start date: 2000   Smokeless tobacco: Never  Vaping Use   Vaping status: Every Day  Substance and Sexual Activity   Alcohol use: Yes    Comment: occasional   Drug use: No   Sexual activity: Not on file  Other Topics Concern   Not on file  Social History Narrative   Not on file   Social Drivers of Health   Financial Resource Strain: Medium Risk (12/10/2022)   Received from [facility]   Overall Financial Resource Strain (CARDIA)    Difficulty of Paying Living Expenses: Somewhat hard  Food Insecurity: No Food Insecurity (01/09/2023)   Hunger Vital Sign    Worried About Running Out of Food in the Last Year: Never true    Ran Out of Food in the Last Year: Never true  Transportation Needs: No Transportation Needs (01/09/2023)   PRAPARE - Administrator, Civil Service (Medical): No    Lack of Transportation (Non-Medical): No  Physical Activity: Not on file  Stress: Not on file  Social Connections: Not on file   Additional Social History:  Sleep: Fair  Appetite:  Good  Current Medications: Current Facility-Administered Medications  Medication Dose Route Frequency Provider Last Rate Last Admin   acetaminophen (TYLENOL) tablet 650 mg  650 mg Oral Q6H PRN Jearld Lesch, NP   650 mg at 01/16/23 1152   alum & mag hydroxide-simeth (MAALOX/MYLANTA) 200-200-20 MG/5ML suspension 30 mL  30 mL Oral Q4H PRN Jearld Lesch, NP       amLODipine (NORVASC) tablet 5 mg  5 mg Oral Daily Rex Kras, MD   5 mg at 01/20/23 1610   haloperidol (HALDOL) tablet 5 mg  5 mg Oral TID PRN Jearld Lesch, NP   5 mg at  01/14/23 2111   And   diphenhydrAMINE (BENADRYL) capsule 50 mg  50 mg Oral TID PRN Jearld Lesch, NP   50 mg at 01/16/23 2102   haloperidol lactate (HALDOL) injection 5 mg  5 mg Intramuscular TID PRN Jearld Lesch, NP       And   diphenhydrAMINE (BENADRYL) injection 50 mg  50 mg Intramuscular TID PRN Jearld Lesch, NP       And   LORazepam (ATIVAN) injection 2 mg  2 mg Intramuscular TID PRN Jearld Lesch, NP       haloperidol lactate (HALDOL) injection 10 mg  10 mg Intramuscular TID PRN Jearld Lesch, NP       And   diphenhydrAMINE (BENADRYL) injection 50 mg  50 mg Intramuscular TID PRN Jearld Lesch, NP       And   LORazepam (ATIVAN) injection 2 mg  2 mg Intramuscular TID PRN Jearld Lesch, NP       fluvoxaMINE (LUVOX) tablet 100 mg  100 mg Oral QHS Rex Kras, MD   100 mg at 01/19/23 2106   fluvoxaMINE (LUVOX) tablet 50 mg  50 mg Oral Daily Rex Kras, MD   50 mg at 01/20/23 0840   gabapentin (NEURONTIN) capsule 300 mg  300 mg Oral TID Jearld Lesch, NP   300 mg at 01/20/23 1702   hydrOXYzine (ATARAX) tablet 50 mg  50 mg Oral TID PRN Jearld Lesch, NP   50 mg at 01/20/23 0842   magnesium hydroxide (MILK OF MAGNESIA) suspension 30 mL  30 mL Oral Daily PRN Jearld Lesch, NP       melatonin tablet 10 mg  10 mg Oral QHS Myriam Forehand, NP   10 mg at 01/19/23 2106   nicotine polacrilex (NICORETTE) gum 2 mg  2 mg Oral PRN Charm Rings, NP   2 mg at 01/20/23 1701   OLANZapine (ZYPREXA) tablet 10 mg  10 mg Oral QHS Myriam Forehand, NP   10 mg at 01/19/23 2106   propranolol (INDERAL) tablet 5 mg  5 mg Oral Daily Myriam Forehand, NP   5 mg at 01/20/23 0840    Lab Results: No results found for this or any previous visit (from the past 48 hours).  Blood Alcohol level:  Lab Results  Component Value Date   ETH <10 01/08/2023   ETH <10 12/26/2022    Metabolic Disorder Labs: Lab Results  Component Value Date   HGBA1C 5.2 12/30/2022   MPG 102.54  12/30/2022   No results found for: "PROLACTIN" Lab Results  Component Value Date   CHOL 145 12/30/2022   TRIG 249 (H) 12/30/2022   HDL 27 (L) 12/30/2022   CHOLHDL 5.4 12/30/2022   VLDL 50 (H) 12/30/2022   LDLCALC  68 12/30/2022    Physical Findings: AIMS:  , ,  ,  ,    CIWA:    COWS:     Musculoskeletal: Strength & Muscle Tone: within normal limits Gait & Station: normal Patient leans: N/A  Psychiatric Specialty Exam:  Presentation  General Appearance:  Appropriate for Environment; Neat  Eye Contact: Good  Speech: Clear and Coherent; Normal Rate  Speech Volume: Normal  Handedness: Right   Mood and Affect  Mood: Anxious; Irritable; Dysphoric  Affect: Full Range   Thought Process  Thought Processes: Coherent  Descriptions of Associations:Intact  Orientation:Full (Time, Place and Person)  Thought Content:Obsessions  History of Schizophrenia/Schizoaffective disorder:No  Duration of Psychotic Symptoms:N/A  Hallucinations:Hallucinations: None Description of Visual Hallucinations: denies  Ideas of Reference:Paranoia  Suicidal Thoughts:Suicidal Thoughts: No  Homicidal Thoughts:Homicidal Thoughts: No HI Active Intent and/or Plan: -- (denies) HI Passive Intent and/or Plan: -- (denies)   Sensorium  Memory: Immediate Good; Remote Good  Judgment: Poor  Insight: Poor   Executive Functions  Concentration: Good  Attention Span: Fair  Recall: Good  Fund of Knowledge: Fair  Language: Fair   Psychomotor Activity  Psychomotor Activity: Psychomotor Activity: Normal   Assets  Assets: Communication Skills   Sleep  Sleep: Sleep: Fair Number of Hours of Sleep: 6    Physical Exam: Physical Exam Vitals and nursing note reviewed.  Neurological:     Mental Status: He is alert.  Psychiatric:        Attention and Perception: Attention and perception normal.        Mood and Affect: Mood is anxious. Affect is flat.         Speech: Speech normal.        Behavior: Behavior is withdrawn. Behavior is cooperative.        Thought Content: Thought content normal.        Cognition and Memory: Cognition and memory normal.        Judgment: Judgment is impulsive.    Review of Systems  Psychiatric/Behavioral:  The patient is nervous/anxious.   All other systems reviewed and are negative.  Blood pressure 137/82, pulse 70, temperature 99.1 F (37.3 C), resp. rate 19, height 5\' 7"  (1.702 m), weight 90.7 kg, SpO2 95%. Body mass index is 31.32 kg/m.   Treatment Plan Summary: Fluvoxamine (Luvox) 100 mg Oral QHS & 50 mg daily For obsessive-compulsive disorder (OCD) or major depressive disorder (MDD). Gabapentin (Neurontin) 300 mg Oral TID ubstance withdrawal symptoms. Zyprexa (olanzapine) to 10 mg nightly: To address intrusive thoughts and mood dysregulation. melatonin 10 mg nightly: To assist with sleep without adding sedative burden.. Propranolol (Inderal) 5 mg Oral Daily anxiety, tremors, and hypertension  Myriam Forehand, NP 01/20/2023, 5:36 PM

## 2023-01-20 DIAGNOSIS — F332 Major depressive disorder, recurrent severe without psychotic features: Secondary | ICD-10-CM | POA: Diagnosis not present

## 2023-01-20 MED ORDER — FLUVOXAMINE MALEATE 50 MG PO TABS
100.0000 mg | ORAL_TABLET | Freq: Every day | ORAL | Status: DC
  Administered 2023-01-21 – 2023-01-22 (×2): 100 mg via ORAL
  Filled 2023-01-20 (×2): qty 2

## 2023-01-20 MED ORDER — OLANZAPINE 10 MG PO TABS
15.0000 mg | ORAL_TABLET | Freq: Every day | ORAL | Status: DC
  Administered 2023-01-20 – 2023-01-21 (×2): 15 mg via ORAL
  Filled 2023-01-20 (×2): qty 1

## 2023-01-20 NOTE — BH Assessment (Signed)
patient presently pleasantly to medication room. Denies si,hi,avh and pain.

## 2023-01-20 NOTE — Plan of Care (Signed)

## 2023-01-20 NOTE — Progress Notes (Signed)
[facility] MD Progress Note  01/20/2023 5:43 PM Ronald [name].  MRN:  [id number] Subjective:  47 year old Caucasian male reporting ongoing intrusive thoughts about harming family members. He states, "I can't sleep," despite nursing staff documenting that he slept through the night without incident. The patient continues to deny suicidal ideation (SI), homicidal ideation (HI), auditory or visual hallucinations (AVH), or specific plans to harm others. His report of symptoms appears inconsistent with observed behavior, raising concerns about potential malingering. Principal Problem: Major depressive disorder, recurrent severe without psychotic features (HCC) Diagnosis: Principal Problem:   Major depressive disorder, recurrent severe without psychotic features (HCC) Active Problems:   Obsessive thinking  Total Time spent with patient: 1 hour  Past Psychiatric History: see below  Past Medical History:  Past Medical History:  Diagnosis Date   Anxiety    Depression    Family history of adverse reaction to anesthesia    difficult to wake up - Mother   GERD (gastroesophageal reflux disease)    PTSD (post-traumatic stress disorder)     Past Surgical History:  Procedure Laterality Date   ELECTROCONVULSIVE THERAPY     FRACTURE SURGERY     wrist left   INSERTION OF MESH  08/25/2021   Procedure: INSERTION OF MESH;  Surgeon: Sung Amabile, DO;  Location: [facility] ORS;  Service: General;;   UMBILICAL HERNIA REPAIR  08/25/2021   Procedure: HERNIA REPAIR UMBILICAL ADULT;  Surgeon: Sung Amabile, DO;  Location: [facility] OR[facility];  Service: General;;   Family History: History reviewed. No pertinent family history. Family Psychiatric  History: none reported Social History:  Social History   Substance and Sexual Activity  Alcohol Use Yes   Comment: occasional     Social History   Substance and Sexual Activity  Drug Use No    Social History   Socioeconomic History   Marital status: Single    Spouse name:  Not on file   Number of children: Not on file   Years of education: Not on file   Highest education level: Not on file  Occupational History   Not on file  Tobacco Use   Smoking status: Every Day    Current packs/day: 1.00    Average packs/day: 1 pack/day for 25.0 years (25.0 ttl pk-yrs)    Types: Cigarettes    Start date: 2000   Smokeless tobacco: Never  Vaping Use   Vaping status: Every Day  Substance and Sexual Activity   Alcohol use: Yes    Comment: occasional   Drug use: No   Sexual activity: Not on file  Other Topics Concern   Not on file  Social History Narrative   Not on file   Social Drivers of Health   Financial Resource Strain: Medium Risk (12/10/2022)   Received from [facility]   Overall Financial Resource Strain (CARDIA)    Difficulty of Paying Living Expenses: Somewhat hard  Food Insecurity: No Food Insecurity (01/09/2023)   Hunger Vital Sign    Worried About Running Out of Food in the Last Year: Never true    Ran Out of Food in the Last Year: Never true  Transportation Needs: No Transportation Needs (01/09/2023)   PRAPARE - Administrator, Civil Service (Medical): No    Lack of Transportation (Non-Medical): No  Physical Activity: Not on file  Stress: Not on file  Social Connections: Not on file   Additional Social History:  Sleep: Poor  Appetite:  Good  Current Medications: Current Facility-Administered Medications  Medication Dose Route Frequency Provider Last Rate Last Admin   acetaminophen (TYLENOL) tablet 650 mg  650 mg Oral Q6H PRN Jearld Lesch, NP   650 mg at 01/16/23 1152   alum & mag hydroxide-simeth (MAALOX/MYLANTA) 200-200-20 MG/5ML suspension 30 mL  30 mL Oral Q4H PRN Dixon, Rashaun M, NP       amLODipine (NORVASC) tablet 5 mg  5 mg Oral Daily Rex Kras, MD   5 mg at 01/20/23 1610   haloperidol (HALDOL) tablet 5 mg  5 mg Oral TID PRN Jearld Lesch, NP   5 mg at 01/14/23 2111    And   diphenhydrAMINE (BENADRYL) capsule 50 mg  50 mg Oral TID PRN Jearld Lesch, NP   50 mg at 01/16/23 2102   haloperidol lactate (HALDOL) injection 10 mg  10 mg Intramuscular TID PRN Jearld Lesch, NP       And   diphenhydrAMINE (BENADRYL) injection 50 mg  50 mg Intramuscular TID PRN Jearld Lesch, NP       And   LORazepam (ATIVAN) injection 2 mg  2 mg Intramuscular TID PRN Durwin Nora, Rashaun M, NP       fluvoxaMINE (LUVOX) tablet 100 mg  100 mg Oral QHS Rex Kras, MD   100 mg at 01/19/23 2106   [START ON 01/21/2023] fluvoxaMINE (LUVOX) tablet 100 mg  100 mg Oral Daily Myriam Forehand, NP       gabapentin (NEURONTIN) capsule 300 mg  300 mg Oral TID Lerry Liner M, NP   300 mg at 01/20/23 1702   hydrOXYzine (ATARAX) tablet 50 mg  50 mg Oral TID PRN Jearld Lesch, NP   50 mg at 01/20/23 0842   magnesium hydroxide (MILK OF MAGNESIA) suspension 30 mL  30 mL Oral Daily PRN Jearld Lesch, NP       melatonin tablet 10 mg  10 mg Oral QHS Myriam Forehand, NP   10 mg at 01/19/23 2106   nicotine polacrilex (NICORETTE) gum 2 mg  2 mg Oral PRN Charm Rings, NP   2 mg at 01/20/23 1701   OLANZapine (ZYPREXA) tablet 15 mg  15 mg Oral QHS Myriam Forehand, NP       propranolol (INDERAL) tablet 5 mg  5 mg Oral Daily Myriam Forehand, NP   5 mg at 01/20/23 0840    Lab Results: No results found for this or any previous visit (from the past 48 hours).  Blood Alcohol level:  Lab Results  Component Value Date   ETH <10 01/08/2023   ETH <10 12/26/2022    Metabolic Disorder Labs: Lab Results  Component Value Date   HGBA1C 5.2 12/30/2022   MPG 102.54 12/30/2022   No results found for: "PROLACTIN" Lab Results  Component Value Date   CHOL 145 12/30/2022   TRIG 249 (H) 12/30/2022   HDL 27 (L) 12/30/2022   CHOLHDL 5.4 12/30/2022   VLDL 50 (H) 12/30/2022   LDLCALC 68 12/30/2022    Physical Findings: AIMS:  , ,  ,  ,    CIWA:    COWS:     Musculoskeletal: Strength & Muscle Tone:  within normal limits Gait & Station: normal Patient leans: N/A  Psychiatric Specialty Exam:  Presentation  General Appearance:  Appropriate for Environment; Neat  Eye Contact: Good  Speech: Clear and Coherent  Speech Volume: Normal  Handedness: Right  Mood and Affect  Mood: Irritable; Anxious  Affect: Flat   Thought Process  Thought Processes: Coherent  Descriptions of Associations:Intact  Orientation:Full (Time, Place and Person)  Thought Content:Obsessions  History of Schizophrenia/Schizoaffective disorder:No  Duration of Psychotic Symptoms:N/A  Hallucinations:Hallucinations: None Description of Visual Hallucinations: denies  Ideas of Reference:None  Suicidal Thoughts:Suicidal Thoughts: No  Homicidal Thoughts:Homicidal Thoughts: No HI Active Intent and/or Plan: -- (denies) HI Passive Intent and/or Plan: -- (denies)   Sensorium  Memory: Immediate Good; Remote Fair  Judgment: Fair  Insight: Fair   Art therapist  Concentration: Good  Attention Span: Fair  Recall: Good  Fund of Knowledge: Good  Language: Good   Psychomotor Activity  Psychomotor Activity: Psychomotor Activity: Normal   Assets  Assets: Communication Skills   Sleep  Sleep: Sleep: Fair Number of Hours of Sleep: 6    Physical Exam: Physical Exam ROS Blood pressure 137/82, pulse 70, temperature 99.1 F (37.3 C), resp. rate 19, height 5\' 7"  (1.702 m), weight 90.7 kg, SpO2 95%. Body mass index is 31.32 kg/m.   Treatment Plan Summary: Daily contact with patient to assess and evaluate symptoms and progress in treatment and Medication management Fluvoxamine (Luvox) 100 mg Oral QHS & 50 mg daily For obsessive-compulsive disorder (OCD) or major depressive disorder (MDD). Gabapentin (Neurontin) 300 mg Oral TID ubstance withdrawal symptoms. Zyprexa (olanzapine) to 10 mg nightly: To address intrusive thoughts and mood dysregulation. melatonin 10  mg nightly: To assist with sleep without adding sedative burden.. Propranolol (Inderal) 5 mg Oral Daily anxiety, tremors, and hypertension Discontinue Haloperidol (Haldol): No longer indicated based on current presentation. Discontinue Lorazepam (Ativan): No ongoing symptoms of agitation, withdrawal, or acute anxiety warranting use. Myriam Forehand, NP 01/20/2023, 5:43 PM

## 2023-01-20 NOTE — Group Note (Signed)
Date:  01/20/2023 Time:  11:46 AM  Group Topic/Focus:  Spirituality:   The focus of this group is to discuss how one's spirituality can aide in recovery.    Participation Level:  Did Not Attend   Ronald [name] 01/20/2023, 11:46 AM

## 2023-01-20 NOTE — Group Note (Signed)
Date:  01/20/2023 Time:  11:27 PM  Group Topic/Focus:  Wrap-Up Group:   The focus of this group is to help patients review their daily goal of treatment and discuss progress on daily workbooks.    Participation Level:  Active  Participation Quality:  Appropriate  Affect:  Appropriate  Cognitive:  Appropriate  Insight: Appropriate  Engagement in Group:  Engaged  Modes of Intervention:  Discussion  Additional Comments:  Pt discussed how he wants to go to a longer treatment facility once he leaves [facility].  This Clinical research associate told patient to bring this up to the Child psychotherapist and doctor so they can try to find him placement before he discharges.  Tacy Dura 01/20/2023, 11:27 PM

## 2023-01-20 NOTE — Plan of Care (Signed)
  Problem: Activity: Goal: Interest or engagement in activities will improve Outcome: Progressing   Problem: Coping: Goal: Ability to verbalize frustrations and anger appropriately will improve Outcome: Progressing   Problem: Health Behavior/Discharge Planning: Goal: Compliance with treatment plan for underlying cause of condition will improve Outcome: Progressing

## 2023-01-21 ENCOUNTER — Other Ambulatory Visit (HOSPITAL_BASED_OUTPATIENT_CLINIC_OR_DEPARTMENT_OTHER): Payer: Self-pay

## 2023-01-21 DIAGNOSIS — F332 Major depressive disorder, recurrent severe without psychotic features: Secondary | ICD-10-CM | POA: Diagnosis not present

## 2023-01-21 NOTE — BHH Counselor (Signed)
CSW spoke with the patient and patient reports that he CAN go to his fathers at discharge.   Penni Homans, MSW, LCSW 01/21/2023 3:27 PM

## 2023-01-21 NOTE — Group Note (Signed)
Recreation Therapy Group Note   Group Topic:General Recreation  Group Date: 01/21/2023 Start Time: 1530 End Time: 1645 Facilitators: Rosina Lowenstein, LRT, CTRS Location:  Dayroom  Group Description: Recreation. Patients were given the opportunity to play cards, journal, or listen to music during group. Pt identified and conversated about things they enjoy doing in their free time and how they can continue to do that outside of the hospital.  Goal Area(s) Addressed: Patient will practice making a positive decision. Patient will have the opportunity to try a new leisure activity. Patient will communicate with peers and LRT.   Affect/Mood: N/A   Participation Level: Did not attend    Clinical Observations/Individualized Feedback: Merlyn did not attend group.   Plan: Continue to engage patient in RT group sessions 2-3x/week.   Rosina Lowenstein, LRT, CTRS 01/21/2023 5:24 PM

## 2023-01-21 NOTE — Progress Notes (Signed)
   01/21/23 0800  Psych Admission Type (Psych Patients Only)  Admission Status Voluntary  Psychosocial Assessment  Patient Complaints Anxiety  Eye Contact Brief  Facial Expression Flat  Affect Anxious  Speech Logical/coherent  Interaction Assertive;Attention-seeking  Motor Activity Slow  Appearance/Hygiene Unremarkable  Behavior Characteristics Anxious;Cooperative  Mood Pleasant;Preoccupied  Aggressive Behavior  Effect No apparent injury  Thought Process  Coherency Circumstantial  Content WDL  Delusions None reported or observed  Perception WDL  Hallucination None reported or observed  Judgment WDL  Confusion WDL  Danger to Self  Current suicidal ideation? Denies  Agreement Not to Harm Self Yes  Description of Agreement Verbal contract  Danger to Others  Danger to Others None reported or observed  Danger to Others Abnormal  Harmful Behavior to others No threats or harm toward other people  Destructive Behavior No threats or harm toward property

## 2023-01-21 NOTE — Progress Notes (Signed)
[facility] MD Progress Note  01/21/2023 7:40 PM [name].  MRN:  [id number] Subjective:   47 year old Caucasian male who reports experiencing anxiety. She denies suicidal ideation (SI), homicidal ideation (HI), and auditory/visual hallucinations (AVH). The patient agrees to a Engineer, manufacturing for safety and reports no intent to harm herself or others.The patient presents with symptoms of anxiety characterized by an anxious affect, slow motor activity, and circumstantial thought processes. Despite her anxiety, she remains cooperative and engaged. The patient denies SI, HI, or AVH and agrees to a Engineer, manufacturing for safety. His \\preoccupied  mood suggests ongoing internal stressors that require continued monitoring and therapeutic intervention. Principal Problem: Major depressive disorder, recurrent severe without psychotic features (HCC) Diagnosis: Principal Problem:   Major depressive disorder, recurrent severe without psychotic features (HCC) Active Problems:   Obsessive thinking  Total Time spent with patient: 45 minutes  Past Psychiatric History: see below  Past Medical History:  Past Medical History:  Diagnosis Date   Anxiety    Depression    Family history of adverse reaction to anesthesia    difficult to wake up - Mother   GERD (gastroesophageal reflux disease)    PTSD (post-traumatic stress disorder)     Past Surgical History:  Procedure Laterality Date   ELECTROCONVULSIVE THERAPY     FRACTURE SURGERY     wrist left   INSERTION OF MESH  08/25/2021   Procedure: INSERTION OF MESH;  Surgeon: Sung Amabile, DO;  Location: [facility] ORS;  Service: General;;   UMBILICAL HERNIA REPAIR  08/25/2021   Procedure: HERNIA REPAIR UMBILICAL ADULT;  Surgeon: Sung Amabile, DO;  Location: [facility] ORS;  Service: General;;   Family History: History reviewed. No pertinent family history. Family Psychiatric  History: none reported Social History:  Social History   Substance and Sexual Activity   Alcohol Use Yes   Comment: occasional     Social History   Substance and Sexual Activity  Drug Use No    Social History   Socioeconomic History   Marital status: Single    Spouse name: Not on file   Number of children: Not on file   Years of education: Not on file   Highest education level: Not on file  Occupational History   Not on file  Tobacco Use   Smoking status: Every Day    Current packs/day: 1.00    Average packs/day: 1 pack/day for 25.0 years (25.0 ttl pk-yrs)    Types: Cigarettes    Start date: 2000   Smokeless tobacco: Never  Vaping Use   Vaping status: Every Day  Substance and Sexual Activity   Alcohol use: Yes    Comment: occasional   Drug use: No   Sexual activity: Not on file  Other Topics Concern   Not on file  Social History Narrative   Not on file   Social Drivers of Health   Financial Resource Strain: Medium Risk (12/10/2022)   Received from [facility]   Overall Financial Resource Strain (CARDIA)    Difficulty of Paying Living Expenses: Somewhat hard  Food Insecurity: No Food Insecurity (01/09/2023)   Hunger Vital Sign    Worried About Running Out of Food in the Last Year: Never true    Ran Out of Food in the Last Year: Never true  Transportation Needs: No Transportation Needs (01/09/2023)   PRAPARE - Administrator, Civil Service (Medical): No    Lack of Transportation (Non-Medical): No  Physical Activity: Not on file  Stress: Not on file  Social Connections: Not on file   Additional Social History:                         Sleep: Good  Appetite:  Good  Current Medications: Current Facility-Administered Medications  Medication Dose Route Frequency Provider Last Rate Last Admin   acetaminophen (TYLENOL) tablet 650 mg  650 mg Oral Q6H PRN Jearld Lesch, NP   650 mg at 01/16/23 1152   alum & mag hydroxide-simeth (MAALOX/MYLANTA) 200-200-20 MG/5ML suspension 30 mL  30 mL Oral Q4H PRN Dixon, Rashaun M, NP        amLODipine (NORVASC) tablet 5 mg  5 mg Oral Daily Rex Kras, MD   5 mg at 01/21/23 7829   haloperidol (HALDOL) tablet 5 mg  5 mg Oral TID PRN Jearld Lesch, NP   5 mg at 01/14/23 2111   And   diphenhydrAMINE (BENADRYL) capsule 50 mg  50 mg Oral TID PRN Jearld Lesch, NP   50 mg at 01/16/23 2102   haloperidol lactate (HALDOL) injection 10 mg  10 mg Intramuscular TID PRN Jearld Lesch, NP       And   diphenhydrAMINE (BENADRYL) injection 50 mg  50 mg Intramuscular TID PRN Jearld Lesch, NP       And   LORazepam (ATIVAN) injection 2 mg  2 mg Intramuscular TID PRN Durwin Nora, Rashaun M, NP       fluvoxaMINE (LUVOX) tablet 100 mg  100 mg Oral QHS Rex Kras, MD   100 mg at 01/20/23 2107   fluvoxaMINE (LUVOX) tablet 100 mg  100 mg Oral Daily Myriam Forehand, NP   100 mg at 01/21/23 5621   gabapentin (NEURONTIN) capsule 300 mg  300 mg Oral TID Jearld Lesch, NP   300 mg at 01/21/23 1707   hydrOXYzine (ATARAX) tablet 50 mg  50 mg Oral TID PRN Jearld Lesch, NP   50 mg at 01/21/23 1455   magnesium hydroxide (MILK OF MAGNESIA) suspension 30 mL  30 mL Oral Daily PRN Jearld Lesch, NP       melatonin tablet 10 mg  10 mg Oral QHS Myriam Forehand, NP   10 mg at 01/20/23 2107   nicotine polacrilex (NICORETTE) gum 2 mg  2 mg Oral PRN Charm Rings, NP   2 mg at 01/21/23 1706   OLANZapine (ZYPREXA) tablet 15 mg  15 mg Oral QHS Myriam Forehand, NP   15 mg at 01/20/23 2107   propranolol (INDERAL) tablet 5 mg  5 mg Oral Daily Myriam Forehand, NP   5 mg at 01/21/23 3086    Lab Results: No results found for this or any previous visit (from the past 48 hours).  Blood Alcohol level:  Lab Results  Component Value Date   ETH <10 01/08/2023   ETH <10 12/26/2022    Metabolic Disorder Labs: Lab Results  Component Value Date   HGBA1C 5.2 12/30/2022   MPG 102.54 12/30/2022   No results found for: "PROLACTIN" Lab Results  Component Value Date   CHOL 145 12/30/2022   TRIG 249 (H)  12/30/2022   HDL 27 (L) 12/30/2022   CHOLHDL 5.4 12/30/2022   VLDL 50 (H) 12/30/2022   LDLCALC 68 12/30/2022    Physical Findings: AIMS:  , ,  ,  ,    CIWA:    COWS:     Musculoskeletal: Strength &  Muscle Tone: within normal limits Gait & Station: normal Patient leans: N/A  Psychiatric Specialty Exam:  Presentation  General Appearance:  Appropriate for Environment; Neat  Eye Contact: Good  Speech: Clear and Coherent  Speech Volume: Normal  Handedness: Right   Mood and Affect  Mood: Anxious; Irritable  Affect: Appropriate   Thought Process  Thought Processes: Coherent  Descriptions of Associations:Intact  Orientation:Full (Time, Place and Person)  Thought Content:Obsessions  History of Schizophrenia/Schizoaffective disorder:No  Duration of Psychotic Symptoms:N/A  Hallucinations:Hallucinations: None Description of Visual Hallucinations: denies  Ideas of Reference:None  Suicidal Thoughts:Suicidal Thoughts: No  Homicidal Thoughts:Homicidal Thoughts: No HI Active Intent and/or Plan: -- (denies) HI Passive Intent and/or Plan: -- (denies)   Sensorium  Memory: Immediate Good; Remote Good  Judgment: Poor  Insight: Poor   Executive Functions  Concentration: Fair  Attention Span: Fair  Recall: Good  Fund of Knowledge: Good  Language: Good   Psychomotor Activity  Psychomotor Activity: Psychomotor Activity: Normal   Assets  Assets: Communication Skills   Sleep  Sleep: Sleep: Good Number of Hours of Sleep: 6    Physical Exam: Physical Exam Psychiatric:        Attention and Perception: Attention and perception normal.        Mood and Affect: Mood is anxious. Affect is labile, blunt and flat.        Speech: Speech normal.        Behavior: Behavior normal. Behavior is cooperative.        Thought Content: Thought content normal.        Cognition and Memory: Cognition and memory normal.        Judgment: Judgment  normal.    Review of Systems  Psychiatric/Behavioral:  The patient is nervous/anxious.   All other systems reviewed and are negative.  Blood pressure (!) 135/90, pulse 78, temperature 98.4 F (36.9 C), temperature source Oral, resp. rate (!) 22, height 5\' 7"  (1.702 m), weight 90.7 kg, SpO2 97%. Body mass index is 31.32 kg/m.   Treatment Plan Summary: Daily contact with patient to assess and evaluate symptoms and progress in treatment and Medication management  Myriam Forehand, NP 01/21/2023, 7:40 PM

## 2023-01-21 NOTE — Group Note (Signed)
Date:  01/21/2023 Time:  9:47 PM  Group Topic/Focus:  Wrap-Up Group:   The focus of this group is to help patients review their daily goal of treatment and discuss progress on daily workbooks.    Participation Level:  Minimal  Participation Quality:  Appropriate and Attentive  Affect:  Appropriate  Cognitive:  Alert and Appropriate  Insight: Appropriate and Lacking  Engagement in Group:  Engaged and Resistant  Modes of Intervention:  Discussion  Additional Comments:     Maglione,Cheryl Chay E 01/21/2023, 9:47 PM

## 2023-01-21 NOTE — Progress Notes (Signed)
   01/20/23 2000  Psych Admission Type (Psych Patients Only)  Admission Status Voluntary  Psychosocial Assessment  Patient Complaints Anxiety;Sleep disturbance  Eye Contact Brief  Facial Expression Flat  Affect Anxious  Speech Logical/coherent  Interaction Assertive;Needy  Motor Activity Slow  Appearance/Hygiene Unremarkable  Behavior Characteristics Anxious;Cooperative  Mood Pleasant;Anxious  Aggressive Behavior  Effect No apparent injury  Thought Process  Coherency Circumstantial  Content WDL  Delusions None reported or observed  Perception WDL  Hallucination None reported or observed  Judgment WDL  Confusion WDL  Danger to Self  Current suicidal ideation? Denies   Patient alert and oriented x 4, no distress noted denies SI/HI/AVH, 15 minutes safety maintained

## 2023-01-21 NOTE — Group Note (Signed)
Recreation Therapy Group Note   Group Topic:Coping Skills  Group Date: 01/21/2023 Start Time: 1000 End Time: 1100 Facilitators: Rosina Lowenstein, LRT, CTRS Location:  Craft Room  Group Description: Mind Map.  Patient was provided a blank template of a diagram with 32 blank boxes in a tiered system, branching from the center (similar to a bubble chart). LRT directed patients to label the middle of the diagram "Coping Skills". LRT and patients then came up with 8 different coping skills as examples. Pt were directed to record their coping skills in the 2nd tier boxes closest to the center.  Patients would then share their coping skills with the group as LRT wrote them out. LRT gave a handout of 99 different coping skills at the end of group.   Goal Area(s) Addressed: Patients will be able to define "coping skills". Patient will identify new coping skills.  Patient will increase communication.   Affect/Mood: Appropriate   Participation Level: Minimal    Clinical Observations/Individualized Feedback: [place] came late to group. Pt shared "I have been here so long I already did this". Pt sat and minimally engaged with LRT and peers while in group.   Plan: Continue to engage patient in RT group sessions 2-3x/week.   Rosina Lowenstein, LRT, CTRS 01/21/2023 1:06 PM

## 2023-01-21 NOTE — Progress Notes (Signed)
Spoke to patient in his room following conversation with his mother over the phone. Patient expressed barriers in his care and opportunities for improvement. Encouraged patient to be active and advocate for his care. Patient expressed limited interaction with opportunities on the unit, isolating in his room. Communicated on how he can advocate and help lead his care, he states he will work to do so.

## 2023-01-21 NOTE — Group Note (Signed)
[facility] LCSW Group Therapy Note    Group Date: 01/21/2023 Start Time: 1315 End Time: 1415  Type of Therapy and Topic:  Group Therapy:  Overcoming Obstacles  Participation Level:  [facility] PARTICIPATION LEVEL: None  Mood:  Description of Group:   In this group patients will be encouraged to explore what they see as obstacles to their own wellness and recovery. They will be guided to discuss their thoughts, feelings, and behaviors related to these obstacles. The group will process together ways to cope with barriers, with attention given to specific choices patients can make. Each patient will be challenged to identify changes they are motivated to make in order to overcome their obstacles. This group will be process-oriented, with patients participating in exploration of their own experiences as well as giving and receiving support and challenge from other group members.  Therapeutic Goals: 1. Patient will identify personal and current obstacles as they relate to admission. 2. Patient will identify barriers that currently interfere with their wellness or overcoming obstacles.  3. Patient will identify feelings, thought process and behaviors related to these barriers. 4. Patient will identify two changes they are willing to make to overcome these obstacles:    Summary of Patient Progress Patient was present in group, however, did not engage in group discussion.    Therapeutic Modalities:   Cognitive Behavioral Therapy Solution Focused Therapy Motivational Interviewing Relapse Prevention Therapy   Harden Mo, LCSW

## 2023-01-21 NOTE — Plan of Care (Signed)
  Problem: Education: Goal: Emotional status will improve Outcome: Progressing Goal: Mental status will improve Outcome: Progressing Goal: Verbalization of understanding the information provided will improve Outcome: Progressing   Problem: Activity: Goal: Interest or engagement in activities will improve Outcome: Progressing Goal: Sleeping patterns will improve Outcome: Progressing   Problem: Coping: Goal: Ability to verbalize frustrations and anger appropriately will improve Outcome: Progressing Goal: Ability to demonstrate self-control will improve Outcome: Progressing   Problem: Health Behavior/Discharge Planning: Goal: Identification of resources available to assist in meeting health care needs will improve Outcome: Progressing Goal: Compliance with treatment plan for underlying cause of condition will improve Outcome: Progressing   Problem: Physical Regulation: Goal: Ability to maintain clinical measurements within normal limits will improve Outcome: Progressing   Problem: Coping: Goal: Ability to verbalize frustrations and anger appropriately will improve Outcome: Progressing

## 2023-01-22 DIAGNOSIS — F332 Major depressive disorder, recurrent severe without psychotic features: Principal | ICD-10-CM

## 2023-01-22 MED ORDER — HYDROXYZINE HCL 50 MG PO TABS: 50.0000 mg | ORAL_TABLET | Freq: Three times a day (TID) | ORAL | 0 refills | Status: AC | PRN

## 2023-01-22 MED ORDER — FLUVOXAMINE MALEATE 50 MG PO TABS
100.0000 mg | ORAL_TABLET | Freq: Two times a day (BID) | ORAL | Status: DC
  Filled 2023-01-22: qty 2

## 2023-01-22 MED ORDER — GABAPENTIN 300 MG PO CAPS: 300.0000 mg | ORAL_CAPSULE | Freq: Three times a day (TID) | ORAL | 0 refills | Status: AC

## 2023-01-22 MED ORDER — AMLODIPINE BESYLATE 5 MG PO TABS: 5.0000 mg | ORAL_TABLET | Freq: Every day | ORAL | 0 refills | Status: AC

## 2023-01-22 MED ORDER — FLUVOXAMINE MALEATE 100 MG PO TABS: 100.0000 mg | ORAL_TABLET | Freq: Two times a day (BID) | ORAL | 0 refills | Status: AC

## 2023-01-22 MED ORDER — OLANZAPINE 15 MG PO TABS: 15.0000 mg | ORAL_TABLET | Freq: Every day | ORAL | 0 refills | Status: AC

## 2023-01-22 MED ORDER — PROPRANOLOL HCL 10 MG PO TABS: 5.0000 mg | ORAL_TABLET | Freq: Every day | ORAL | 0 refills | Status: AC

## 2023-01-22 NOTE — BHH Counselor (Signed)
CSW met with pt briefly to discuss discharge. He confirmed plans to return to his father's home. Pt shared that he is uncertain regarding transportation but is currently trying to figure that out. Pt and CSW discussed this briefly. He was encouraged to let team know something around lunch time. He agreed, stating that he was trying to call them now. Pt has clothes to wear home. Pt and CSW discussed aftercare appointment. He was informed that he has an appointment scheduled for 02/01/23 at 11AM. Pt asked if they had an earlier appointment. CSW shared that it was likely the earliest appointment that they hade but that he would call a check to see if it could be moved up. Pt agreed. He reported that he is a smoker who is not interested in cessation. Pt denied any substance use or need for these specific services. No other concerns expressed. Contact ended without incident.   Ronald [name]. Ronald [name], MSW, LCSW, LCAS 01/22/2023 10:09 AM

## 2023-01-22 NOTE — [facility] Suicide Risk Assessment (Signed)
[facility] Discharge Suicide Risk Assessment   Principal Problem: Major depressive disorder, recurrent severe without psychotic features [facility]) Discharge Diagnoses: Principal Problem:   Major depressive disorder, recurrent severe without psychotic features (HCC) Active Problems:   Obsessive thinking   Total Time spent with patient: 45 minutes  Musculoskeletal: Strength & Muscle Tone: within normal limits Gait & Station: normal Patient leans: N/A  Psychiatric Specialty Exam: Physical Exam Vitals and nursing note reviewed.  Constitutional:      Appearance: Normal appearance.  HENT:     Head: Normocephalic.     Nose: Nose normal.  Pulmonary:     Effort: Pulmonary effort is normal.  Musculoskeletal:        General: Normal range of motion.     Cervical back: Normal range of motion.  Neurological:     General: No focal deficit present.     Mental Status: He is alert and oriented to person, place, and time.     Review of Systems  Psychiatric/Behavioral:  The patient is nervous/anxious.   All other systems reviewed and are negative.   Blood pressure (!) 140/95, pulse (!) 57, temperature 97.9 F (36.6 C), temperature source Oral, resp. rate 20, height 5\' 7"  (1.702 m), weight 90.7 kg, SpO2 97%.Body mass index is 31.32 kg/m.  General Appearance: Casual  Eye Contact:  Good  Speech:  Normal Rate  Volume:  Normal  Mood:  Anxious  Affect:  Congruent  Thought Process:  Coherent  Orientation:  Full (Time, Place, and Person)  Thought Content:  WDL and Logical  Suicidal Thoughts:  No  Homicidal Thoughts:  No  Memory:  Immediate;   Good Recent;   Good Remote;   Good  Judgement:  Good  Insight:  Good  Psychomotor Activity:  Normal  Concentration:  Concentration: Good and Attention Span: Good  Recall:  Good  Fund of Knowledge:  Good  Language:  Good  Akathisia:  No  Handed:  Right  AIMS (if indicated):     Assets:  Housing Leisure Time Physical Health Resilience Social Support   ADL's:  Intact  Cognition:  WNL  Sleep:         Physical Exam: Physical Exam Vitals and nursing note reviewed.  Constitutional:      Appearance: Normal appearance.  HENT:     Head: Normocephalic.     Nose: Nose normal.  Pulmonary:     Effort: Pulmonary effort is normal.  Musculoskeletal:        General: Normal range of motion.     Cervical back: Normal range of motion.  Neurological:     General: No focal deficit present.     Mental Status: He is alert and oriented to person, place, and time.    Review of Systems  Psychiatric/Behavioral:  The patient is nervous/anxious.   All other systems reviewed and are negative.  Blood pressure (!) 140/95, pulse (!) 57, temperature 97.9 F (36.6 C), temperature source Oral, resp. rate 20, height 5\' 7"  (1.702 m), weight 90.7 kg, SpO2 97%. Body mass index is 31.32 kg/m.  Mental Status Per Nursing Assessment::   On Admission:  NA  Demographic Factors:  Male and Caucasian  Loss Factors: NA  Historical Factors: NA  Risk Reduction Factors:   Sense of responsibility to family, Living with another person, especially a relative, Positive social support, and Positive therapeutic relationship  Continued Clinical Symptoms:  Anxiety, mild  Cognitive Features That Contribute To Risk:  None    Suicide Risk:  Minimal:  No identifiable suicidal ideation.  Patients presenting with no risk factors but with morbid ruminations; may be classified as minimal risk based on the severity of the depressive symptoms   Follow-up Information     [facility], [facility]. Go to.   Why: Therapy appointment scheduled for 02/01/23 at 11AM. Contact information: [address] [place] [address] [phone]                 Plan Of Care/Follow-up recommendations:  Activity:  as tolerated Diet:  heart healthy diet Major depressive disorder, recurrent, severe without psychosis: Luvox 100 mg BID   OCD: Gabapentin 300 mg TID  (Luvox above) Zyprexa 15 mg daily at bedtime Hydroxyzine 50 mg TID PRN   Insomnia: Melatonin 10 mg daily at bedtime PRN  Nanine Means, NP 01/22/2023, 10:18 AM

## 2023-01-22 NOTE — Group Note (Signed)
Date:  01/22/2023 Time:  3:35 PM  Group Topic/Focus:  Activity Group:  The focus of the group is to promote activity for the patients to encourage them to go outside to the courtyard for some fresh air and some exercise.    Participation Level:  Active  Participation Quality:  Appropriate  Affect:  Appropriate  Cognitive:  Appropriate  Insight: Appropriate  Engagement in Group:  Engaged  Modes of Intervention:  Activity  Additional Comments:    Ronald [name] 01/22/2023, 3:35 PM

## 2023-01-22 NOTE — Discharge Summary (Signed)
Physician Discharge Summary Note  Patient:  Ronald [name]. is an 47 y.o., male MRN:  [id number] DOB:  1975/10/22 Patient phone:  [phone] (home)  Patient address:   [address] [place] [place] [facility] [address],  Total Time spent with patient: 45 minutes  Date of Admission:  01/09/2023 Date of Discharge: 01/22/2023  Reason for Admission:  homicidal ideations  Principal Problem: Major depressive disorder, recurrent severe without psychotic features [facility]) Discharge Diagnoses: Principal Problem:   Major depressive disorder, recurrent severe without psychotic features (HCC) Active Problems:   Obsessive thinking   Past Psychiatric History: OCD, depression, anxiety  Past Medical History:  Past Medical History:  Diagnosis Date   Anxiety    Depression    Family history of adverse reaction to anesthesia    difficult to wake up - Mother   GERD (gastroesophageal reflux disease)    PTSD (post-traumatic stress disorder)     Past Surgical History:  Procedure Laterality Date   ELECTROCONVULSIVE THERAPY     FRACTURE SURGERY     wrist left   INSERTION OF MESH  08/25/2021   Procedure: INSERTION OF MESH;  Surgeon: Sung Amabile, DO;  Location: [facility] ORS;  Service: General;;   UMBILICAL HERNIA REPAIR  08/25/2021   Procedure: HERNIA REPAIR UMBILICAL ADULT;  Surgeon: Sung Amabile, DO;  Location: [facility] ORS;  Service: General;;   Family History: History reviewed. No pertinent family history. Family Psychiatric  History: none Social History:  Social History   Substance and Sexual Activity  Alcohol Use Yes   Comment: occasional     Social History   Substance and Sexual Activity  Drug Use No    Social History   Socioeconomic History   Marital status: Single    Spouse name: Not on file   Number of children: Not on file   Years of education: Not on file   Highest education level: Not on file  Occupational History   Not on file  Tobacco Use   Smoking status: Every Day    Current  packs/day: 1.00    Average packs/day: 1 pack/day for 25.0 years (25.0 ttl pk-yrs)    Types: Cigarettes    Start date: 2000   Smokeless tobacco: Never  Vaping Use   Vaping status: Every Day  Substance and Sexual Activity   Alcohol use: Yes    Comment: occasional   Drug use: No   Sexual activity: Not on file  Other Topics Concern   Not on file  Social History Narrative   Not on file   Social Drivers of Health   Financial Resource Strain: Medium Risk (12/10/2022)   Received from [facility]   Overall Financial Resource Strain (CARDIA)    Difficulty of Paying Living Expenses: Somewhat hard  Food Insecurity: No Food Insecurity (01/09/2023)   Hunger Vital Sign    Worried About Running Out of Food in the Last Year: Never true    Ran Out of Food in the Last Year: Never true  Transportation Needs: No Transportation Needs (01/09/2023)   PRAPARE - Administrator, Civil Service (Medical): No    Lack of Transportation (Non-Medical): No  Physical Activity: Not on file  Stress: Not on file  Social Connections: Not on file    Hospital Course:   47 yo male presented with OCD thinking and intrusive thoughts to harm his granddaughter which is very upsetting to him as he loves her and other.  He has been here for 13 days for  therapy and medication adjustments after a two week previous stay with a quick return to the hospital.  [name] is established now with [facility] with plans to go live with his father.  Denies suicidal/homicidal ideations, hallucinations, and substance abuse.  [place] has met maximum capacity of hospitalizations.  Discharge instructions provided with explanations along with crisis numbers, follow up appointment information, and Rx.  Musculoskeletal: Strength & Muscle Tone: within normal limits Gait & Station: normal Patient leans: N/A   Psychiatric Specialty Exam: Physical Exam Vitals and nursing note reviewed.  Constitutional:      Appearance: Normal appearance.   HENT:     Head: Normocephalic.     Nose: Nose normal.  Pulmonary:     Effort: Pulmonary effort is normal.  Musculoskeletal:        General: Normal range of motion.     Cervical back: Normal range of motion.  Neurological:     General: No focal deficit present.     Mental Status: He is alert and oriented to person, place, and time.       Review of Systems  Psychiatric/Behavioral:  The patient is nervous/anxious.   All other systems reviewed and are negative.    Blood pressure (!) 140/95, pulse (!) 57, temperature 97.9 F (36.6 C), temperature source Oral, resp. rate 20, height 5\' 7"  (1.702 m), weight 90.7 kg, SpO2 97%.Body mass index is 31.32 kg/m.  General Appearance: Casual  Eye Contact:  Good  Speech:  Normal Rate  Volume:  Normal  Mood:  Anxious  Affect:  Congruent  Thought Process:  Coherent  Orientation:  Full (Time, Place, and Person)  Thought Content:  WDL and Logical  Suicidal Thoughts:  No  Homicidal Thoughts:  No  Memory:  Immediate;   Good Recent;   Good Remote;   Good  Judgement:  Good  Insight:  Good  Psychomotor Activity:  Normal  Concentration:  Concentration: Good and Attention Span: Good  Recall:  Good  Fund of Knowledge:  Good  Language:  Good  Akathisia:  No  Handed:  Right  AIMS (if indicated):     Assets:  Housing Leisure Time Physical Health Resilience Social Support  ADL's:  Intact  Cognition:  WNL  Sleep:       Physical Exam: Physical Exam Vitals and nursing note reviewed.  Constitutional:      Appearance: Normal appearance.  HENT:     Head: Normocephalic.     Nose: Nose normal.  Pulmonary:     Effort: Pulmonary effort is normal.  Musculoskeletal:        General: Normal range of motion.     Cervical back: Normal range of motion.  Neurological:     General: No focal deficit present.     Mental Status: He is alert and oriented to person, place, and time.    Review of Systems  Psychiatric/Behavioral:  The patient is  nervous/anxious.   All other systems reviewed and are negative.  Blood pressure (!) 140/95, pulse (!) 57, temperature 97.9 F (36.6 C), temperature source Oral, resp. rate 20, height 5\' 7"  (1.702 m), weight 90.7 kg, SpO2 97%. Body mass index is 31.32 kg/m.   Social History   Tobacco Use  Smoking Status Every Day   Current packs/day: 1.00   Average packs/day: 1 pack/day for 25.0 years (25.0 ttl pk-yrs)   Types: Cigarettes   Start date: 2000  Smokeless Tobacco Never   Tobacco Cessation:  A prescription for an FDA-approved tobacco cessation medication  was offered at discharge and the patient refused   Blood Alcohol level:  Lab Results  Component Value Date   [facility] <10 01/08/2023   ETH <10 12/26/2022    Metabolic Disorder Labs:  Lab Results  Component Value Date   HGBA1C 5.2 12/30/2022   MPG 102.54 12/30/2022   No results found for: "PROLACTIN" Lab Results  Component Value Date   CHOL 145 12/30/2022   TRIG 249 (H) 12/30/2022   HDL 27 (L) 12/30/2022   CHOLHDL 5.4 12/30/2022   VLDL 50 (H) 12/30/2022   LDLCALC 68 12/30/2022    See Psychiatric Specialty Exam and Suicide Risk Assessment completed by Attending Physician prior to discharge.  Discharge destination:  Home  Is patient on multiple antipsychotic therapies at discharge:  No   Has Patient had three or more failed trials of antipsychotic monotherapy by history:  No  Recommended Plan for Multiple Antipsychotic Therapies: NA  Discharge Instructions     Diet - low sodium heart healthy   Complete by: As directed    Discharge instructions   Complete by: As directed    Follow up with [facility]   Increase activity slowly   Complete by: As directed       Allergies as of 01/22/2023       Reactions   Buspar [buspirone]         Medication List     STOP taking these medications    escitalopram 10 MG tablet Commonly known as: LEXAPRO   nicotine 21 mg/24hr patch Commonly known as: NICODERM CQ - dosed in  mg/24 hours   nicotine polacrilex 2 MG gum Commonly known as: NICORETTE   QUEtiapine 200 MG tablet Commonly known as: SEROQUEL   traZODone 50 MG tablet Commonly known as: DESYREL       TAKE these medications      Indication  amLODipine 5 MG tablet Commonly known as: NORVASC Take 1 tablet (5 mg total) by mouth daily. Start taking on: January 23, 2023  Indication: High Blood Pressure   fluvoxaMINE 100 MG tablet Commonly known as: LUVOX Take 1 tablet (100 mg total) by mouth 2 (two) times daily.  Indication: Obsessive Compulsive Disorder   gabapentin 300 MG capsule Commonly known as: NEURONTIN Take 1 capsule (300 mg total) by mouth 3 (three) times daily.  Indication: Generalized Anxiety Disorder   hydrOXYzine 50 MG tablet Commonly known as: ATARAX Take 1 tablet (50 mg total) by mouth 3 (three) times daily as needed for anxiety.  Indication: Feeling Anxious, Feeling Tense   multivitamin with minerals Tabs tablet Take 1 tablet by mouth daily.  Indication: supplement   OLANZapine 15 MG tablet Commonly known as: ZYPREXA Take 1 tablet (15 mg total) by mouth at bedtime. What changed:  medication strength how much to take  Indication: Major Depressive Disorder   propranolol 10 MG tablet Commonly known as: INDERAL Take 0.5 tablets (5 mg total) by mouth daily. Start taking on: January 23, 2023 What changed: how much to take  Indication: Feeling Anxious   thiamine 100 MG tablet Commonly known as: VITAMIN B1 Take 1 tablet (100 mg total) by mouth daily.  Indication: Deficiency of Vitamin B1        Follow-up Information     [facility], [facility]. Go to.   Why: Therapy appointment scheduled for 02/01/23 at 11AM. Contact information: [address] [place] [address] [phone]  Follow-up recommendations:  Activity:  as tolerated Diet:  heart healthy diet Major depressive disorder, recurrent, severe without  psychosis: Luvox 100 mg BID   OCD: Gabapentin 300 mg TID (Luvox above) Zyprexa 15 mg daily at bedtime Hydroxyzine 50 mg TID PRN   Insomnia: Melatonin 10 mg daily at bedtime PRN  Comments:  follow up with [facility]  Signed: Nanine Means, NP 01/22/2023, 10:23 AM

## 2023-01-22 NOTE — Progress Notes (Signed)
Patient in room most of shift. Visible in milieu at times. Voiced increased anxiety this shift. Denies SI, HI, AVH. Encouragement and support provided. Safety checks maintained. Medications given as prescribed. Pt receptive and remains safe on unit with q 15 min checks.

## 2023-01-22 NOTE — Group Note (Signed)
LCSW Group Therapy Note   Group Date: 01/22/2023 Start Time: 1300 End Time: 1405   Type of Therapy and Topic:  Group Therapy: Challenging Core Beliefs  Participation Level:  Active  Description of Group:  Patients were educated about core beliefs and asked to identify one harmful core belief that they have. Patients were asked to explore from where those beliefs originate. Patients were asked to discuss how those beliefs make them feel and the resulting behaviors of those beliefs. They were then be asked if those beliefs are true and, if so, what evidence they have to support them. Lastly, group members were challenged to replace those negative core beliefs with helpful beliefs.   Therapeutic Goals:   1. Patient will identify harmful core beliefs and explore the origins of such beliefs. 2. Patient will identify feelings and behaviors that result from those core beliefs. 3. Patient will discuss whether such beliefs are true. 4.  Patient will replace harmful core beliefs with helpful ones.  Summary of Patient Progress:  Patient actively engaged in processing and exploring how core beliefs are formed and how they impact thoughts, feelings, and behaviors. Patient proved open to input from peers and feedback from CSW. Patient demonstrated proficient insight into the subject matter, was respectful and supportive of peers, and participated throughout the entire session.  Therapeutic Modalities: Cognitive Behavioral Therapy; Solution-Focused Therapy   Lowry Ram, Theresia Majors 01/22/2023  2:41 PM

## 2023-01-22 NOTE — Progress Notes (Signed)
  [facility] Adult Case Management Discharge Plan :  Will you be returning to the same living situation after discharge:  Yes,  Patient to return home.  At discharge, do you have transportation home?: Yes,  CSW provided patient with taxi.  Do you have the ability to pay for your medications: [facility]   Release of information consent forms completed and in the chart;  Patient's signature needed at discharge.  Patient to Follow up at:  Follow-up Information     [facility], [facility]. Go to.   Why: Therapy appointment scheduled for 02/01/23 at 11AM. Contact information: [address] [place] [address] [phone]                 Next level of care provider has access to [facility] Link:no  Safety Planning and Suicide Prevention discussed: Yes,  SPE completed with patient's mother with patient's consent. SPE material provided at discharge.      Has patient been referred to the Quitline?: Patient does not use tobacco/nicotine products  Patient has been referred for addiction treatment: No known substance use disorder.  Lowry Ram, LCSW 01/22/2023, 2:47 PM

## 2023-01-22 NOTE — Group Note (Signed)
Recreation Therapy Group Note   Group Topic:Goal Setting  Group Date: 01/22/2023 Start Time: 1000 End Time: 1100 Facilitators: Rosina Lowenstein, LRT, CTRS Location:  Craft Room  Group Description: Product/process development scientist. Patients were given many different magazines, a glue stick, markers, and a piece of cardstock paper. LRT and pts discussed the importance of having goals in life. LRT and pts discussed the difference between short-term and long-term goals, as well as what a SMART goal is. LRT encouraged pts to create a vision board, with images they picked and then cut out with safety scissors from the magazine, for themselves, that capture their short and long-term goals. LRT encouraged pts to show and explain their vision board to the group.   Goal Area(s) Addressed:  Patient will gain knowledge of short vs. long term goals.  Patient will identify goals for themselves. Patient will practice setting SMART goals. Patient will verbalize their goals to LRT and peers.   Affect/Mood: N/A   Participation Level: Did not attend    Clinical Observations/Individualized Feedback: Patient did not attend group.   Plan: Continue to engage patient in RT group sessions 2-3x/week.   Rosina Lowenstein, LRT, CTRS 01/22/2023 1:04 PM

## 2023-01-22 NOTE — Progress Notes (Signed)
   01/22/23 1141  Psych Admission Type (Psych Patients Only)  Admission Status Voluntary  Psychosocial Assessment  Patient Complaints Worrying  Eye Contact Brief  Facial Expression Flat  Affect Flat  Speech Logical/coherent  Interaction Attention-seeking;Needy;Minimal  Motor Activity Slow  Appearance/Hygiene Unremarkable  Behavior Characteristics Resistant to care;Anxious  Mood Pleasant  Thought Process  Coherency Circumstantial  Content WDL  Delusions None reported or observed  Perception WDL  Hallucination None reported or observed  Judgment WDL  Confusion WDL  Danger to Self  Current suicidal ideation? Denies  Self-Injurious Behavior No self-injurious ideation or behavior indicators observed or expressed   Agreement Not to Harm Self Yes  Description of Agreement Verbal  Danger to Others  Danger to Others None reported or observed  Danger to Others Abnormal  Harmful Behavior to others No threats or harm toward other people   Patient is stable for discharge, with anxiety symptoms noted but no active risk factors such as SI, HI, or AVH. All discharge instructions provided, including the importance of continuing psychiatric care and medication management. Patient has agreed to a Engineer, manufacturing for safety, and family support is advised for additional monitoring. Discharged with all discharge instructions with acknowledgement. All belongings given to patient

## 2023-01-22 NOTE — Plan of Care (Signed)
  Problem: Education: Goal: Emotional status will improve Outcome: Progressing Goal: Mental status will improve Outcome: Progressing Goal: Verbalization of understanding the information provided will improve Outcome: Progressing   Problem: Activity: Goal: Interest or engagement in activities will improve Outcome: Progressing   Problem: Coping: Goal: Ability to verbalize frustrations and anger appropriately will improve Outcome: Progressing

## 2023-01-24 ENCOUNTER — Other Ambulatory Visit: Payer: Self-pay

## 2023-03-29 ENCOUNTER — Other Ambulatory Visit: Payer: Self-pay | Admitting: Psychiatry

## 2023-08-13 ENCOUNTER — Ambulatory Visit: Payer: MEDICAID | Attending: Otolaryngology

## 2023-08-13 ENCOUNTER — Other Ambulatory Visit: Payer: Self-pay | Admitting: Pediatrics

## 2023-08-13 DIAGNOSIS — R519 Headache, unspecified: Secondary | ICD-10-CM | POA: Insufficient documentation

## 2023-08-13 DIAGNOSIS — G4700 Insomnia, unspecified: Secondary | ICD-10-CM | POA: Insufficient documentation

## 2023-08-13 DIAGNOSIS — G4452 New daily persistent headache (NDPH): Secondary | ICD-10-CM

## 2023-08-13 DIAGNOSIS — G4733 Obstructive sleep apnea (adult) (pediatric): Secondary | ICD-10-CM | POA: Insufficient documentation

## 2023-11-01 ENCOUNTER — Ambulatory Visit: Payer: MEDICAID | Attending: Otolaryngology

## 2023-11-01 DIAGNOSIS — G4733 Obstructive sleep apnea (adult) (pediatric): Secondary | ICD-10-CM | POA: Diagnosis present

## 2023-11-18 ENCOUNTER — Ambulatory Visit: Payer: Self-pay
